# Patient Record
Sex: Female | Born: 1945
Health system: Southern US, Community
[De-identification: ages and names within clinical notes are randomized; demographics above are authoritative.]

## PROBLEM LIST (undated history)

## (undated) DIAGNOSIS — E785 Hyperlipidemia, unspecified: Secondary | ICD-10-CM

## (undated) DIAGNOSIS — E876 Hypokalemia: Secondary | ICD-10-CM

## (undated) DIAGNOSIS — M179 Osteoarthritis of knee, unspecified: Secondary | ICD-10-CM

## (undated) DIAGNOSIS — R011 Cardiac murmur, unspecified: Secondary | ICD-10-CM

## (undated) DIAGNOSIS — Z87442 Personal history of urinary calculi: Secondary | ICD-10-CM

## (undated) DIAGNOSIS — I10 Essential (primary) hypertension: Secondary | ICD-10-CM

## (undated) DIAGNOSIS — IMO0001 Reserved for inherently not codable concepts without codable children: Secondary | ICD-10-CM

## (undated) DIAGNOSIS — Z9889 Other specified postprocedural states: Secondary | ICD-10-CM

## (undated) DIAGNOSIS — M810 Age-related osteoporosis without current pathological fracture: Secondary | ICD-10-CM

## (undated) DIAGNOSIS — E278 Other specified disorders of adrenal gland: Secondary | ICD-10-CM

## (undated) DIAGNOSIS — M1710 Unilateral primary osteoarthritis, unspecified knee: Secondary | ICD-10-CM

## (undated) DIAGNOSIS — Z464 Encounter for fitting and adjustment of orthodontic device: Secondary | ICD-10-CM

## (undated) DIAGNOSIS — M170 Bilateral primary osteoarthritis of knee: Secondary | ICD-10-CM

## (undated) DIAGNOSIS — T7840XA Allergy, unspecified, initial encounter: Secondary | ICD-10-CM

## (undated) DIAGNOSIS — M8580 Other specified disorders of bone density and structure, unspecified site: Secondary | ICD-10-CM

## (undated) DIAGNOSIS — K219 Gastro-esophageal reflux disease without esophagitis: Secondary | ICD-10-CM

## (undated) DIAGNOSIS — M519 Unspecified thoracic, thoracolumbar and lumbosacral intervertebral disc disorder: Secondary | ICD-10-CM

## (undated) DIAGNOSIS — J40 Bronchitis, not specified as acute or chronic: Secondary | ICD-10-CM

## (undated) DIAGNOSIS — K635 Polyp of colon: Secondary | ICD-10-CM

## (undated) DIAGNOSIS — R112 Nausea with vomiting, unspecified: Secondary | ICD-10-CM

## (undated) DIAGNOSIS — H269 Unspecified cataract: Secondary | ICD-10-CM

## (undated) HISTORY — PX: FUNCTIONAL ENDOSCOPIC SINUS SURGERY: SUR616

## (undated) HISTORY — PX: COLONOSCOPY W/ POLYPECTOMY: SHX1380

## (undated) HISTORY — PX: OTHER SURGICAL HISTORY: SHX169

## (undated) HISTORY — DX: Polyp of colon: K63.5

## (undated) HISTORY — PX: COLONOSCOPY: SHX174

## (undated) HISTORY — PX: TUBAL LIGATION: SHX77

## (undated) HISTORY — PX: CARPAL TUNNEL RELEASE: SHX101

## (undated) HISTORY — DX: Unilateral primary osteoarthritis, unspecified knee: M17.10

## (undated) HISTORY — DX: Unspecified cataract: H26.9

## (undated) HISTORY — DX: Bronchitis, not specified as acute or chronic: J40

## (undated) HISTORY — DX: Allergy, unspecified, initial encounter: T78.40XA

## (undated) HISTORY — DX: Cardiac murmur, unspecified: R01.1

## (undated) HISTORY — DX: Essential (primary) hypertension: I10

## (undated) HISTORY — DX: Other specified disorders of bone density and structure, unspecified site: M85.80

## (undated) HISTORY — PX: KNEE ARTHROSCOPY: SUR90

## (undated) HISTORY — DX: Osteoarthritis of knee, unspecified: M17.9

## (undated) HISTORY — DX: Hyperlipidemia, unspecified: E78.5

## (undated) HISTORY — DX: Gastro-esophageal reflux disease without esophagitis: K21.9

## (undated) HISTORY — PX: BUNIONECTOMY: SHX129

---

## 2004-06-02 LAB — HM DIABETES FOOT EXAM

## 2004-06-07 ENCOUNTER — Ambulatory Visit: Payer: Self-pay | Admitting: Gastroenterology

## 2004-06-14 ENCOUNTER — Ambulatory Visit: Payer: Self-pay | Admitting: Gastroenterology

## 2004-07-03 HISTORY — PX: VEIN SURGERY: SHX48

## 2004-09-28 ENCOUNTER — Ambulatory Visit: Payer: Self-pay | Admitting: Unknown Physician Specialty

## 2004-10-26 ENCOUNTER — Ambulatory Visit: Payer: Self-pay

## 2005-10-12 ENCOUNTER — Ambulatory Visit: Payer: Self-pay | Admitting: Unknown Physician Specialty

## 2005-11-30 ENCOUNTER — Encounter: Payer: Self-pay | Admitting: Family Medicine

## 2006-07-06 ENCOUNTER — Encounter: Payer: Self-pay | Admitting: Family Medicine

## 2006-09-20 ENCOUNTER — Ambulatory Visit (HOSPITAL_BASED_OUTPATIENT_CLINIC_OR_DEPARTMENT_OTHER): Admission: RE | Admit: 2006-09-20 | Discharge: 2006-09-20 | Payer: Self-pay | Admitting: Orthopedic Surgery

## 2006-10-15 ENCOUNTER — Ambulatory Visit: Payer: Self-pay | Admitting: Unknown Physician Specialty

## 2006-11-22 ENCOUNTER — Ambulatory Visit (HOSPITAL_BASED_OUTPATIENT_CLINIC_OR_DEPARTMENT_OTHER): Admission: RE | Admit: 2006-11-22 | Discharge: 2006-11-22 | Payer: Self-pay | Admitting: Orthopedic Surgery

## 2006-12-04 ENCOUNTER — Encounter: Payer: Self-pay | Admitting: Family Medicine

## 2007-01-21 ENCOUNTER — Encounter: Payer: Self-pay | Admitting: Family Medicine

## 2007-10-17 ENCOUNTER — Ambulatory Visit: Payer: Self-pay | Admitting: Unknown Physician Specialty

## 2008-01-17 ENCOUNTER — Ambulatory Visit: Payer: Self-pay | Admitting: Vascular Surgery

## 2008-03-24 ENCOUNTER — Ambulatory Visit: Payer: Self-pay | Admitting: Family Medicine

## 2008-03-24 DIAGNOSIS — I10 Essential (primary) hypertension: Secondary | ICD-10-CM

## 2008-03-24 DIAGNOSIS — Z860100 Personal history of colon polyps, unspecified: Secondary | ICD-10-CM | POA: Insufficient documentation

## 2008-03-24 DIAGNOSIS — E785 Hyperlipidemia, unspecified: Secondary | ICD-10-CM | POA: Insufficient documentation

## 2008-04-23 ENCOUNTER — Encounter: Payer: Self-pay | Admitting: Family Medicine

## 2008-06-24 ENCOUNTER — Encounter: Payer: Self-pay | Admitting: Family Medicine

## 2008-07-17 ENCOUNTER — Ambulatory Visit: Payer: Self-pay | Admitting: Vascular Surgery

## 2008-08-31 LAB — CONVERTED CEMR LAB: Pap Smear: NORMAL

## 2008-08-31 LAB — HM MAMMOGRAPHY: HM Mammogram: NORMAL

## 2008-10-20 ENCOUNTER — Ambulatory Visit: Payer: Self-pay | Admitting: Unknown Physician Specialty

## 2008-10-23 ENCOUNTER — Telehealth: Payer: Self-pay | Admitting: Family Medicine

## 2008-10-29 ENCOUNTER — Ambulatory Visit: Payer: Self-pay | Admitting: Family Medicine

## 2009-05-17 ENCOUNTER — Telehealth: Payer: Self-pay | Admitting: Family Medicine

## 2009-05-31 ENCOUNTER — Encounter: Admission: RE | Admit: 2009-05-31 | Discharge: 2009-05-31 | Payer: Self-pay | Admitting: Orthopedic Surgery

## 2009-06-22 ENCOUNTER — Encounter: Payer: Self-pay | Admitting: Family Medicine

## 2009-07-07 ENCOUNTER — Telehealth: Payer: Self-pay | Admitting: Family Medicine

## 2009-07-30 ENCOUNTER — Ambulatory Visit: Payer: Self-pay | Admitting: Vascular Surgery

## 2009-08-13 ENCOUNTER — Ambulatory Visit: Payer: Self-pay | Admitting: Family Medicine

## 2009-08-13 DIAGNOSIS — J019 Acute sinusitis, unspecified: Secondary | ICD-10-CM

## 2009-09-03 ENCOUNTER — Telehealth: Payer: Self-pay | Admitting: Family Medicine

## 2009-09-08 ENCOUNTER — Telehealth: Payer: Self-pay | Admitting: Family Medicine

## 2009-09-27 ENCOUNTER — Telehealth: Payer: Self-pay | Admitting: Family Medicine

## 2009-10-07 ENCOUNTER — Encounter: Payer: Self-pay | Admitting: Family Medicine

## 2009-10-07 ENCOUNTER — Telehealth: Payer: Self-pay | Admitting: Family Medicine

## 2009-10-21 ENCOUNTER — Ambulatory Visit: Payer: Self-pay | Admitting: Unknown Physician Specialty

## 2009-11-11 ENCOUNTER — Encounter (INDEPENDENT_AMBULATORY_CARE_PROVIDER_SITE_OTHER): Payer: Self-pay | Admitting: *Deleted

## 2009-12-30 ENCOUNTER — Encounter: Payer: Self-pay | Admitting: Family Medicine

## 2010-01-04 ENCOUNTER — Telehealth: Payer: Self-pay | Admitting: Family Medicine

## 2010-01-05 ENCOUNTER — Telehealth: Payer: Self-pay | Admitting: Family Medicine

## 2010-06-10 ENCOUNTER — Encounter: Payer: Self-pay | Admitting: Family Medicine

## 2010-06-10 ENCOUNTER — Telehealth: Payer: Self-pay | Admitting: Family Medicine

## 2010-06-24 ENCOUNTER — Telehealth: Payer: Self-pay | Admitting: Family Medicine

## 2010-08-03 NOTE — Progress Notes (Signed)
Summary: prior [name] is needed for diovan  Phone Note From Pharmacy   [facility] Summary of Call: Prior [name] is needed for diovan, form is on your shelf.  She says she has not tried other meds than diovan but she says she has been on it for years. Initial call taken by: Lowella Petties CMA,  September 08, 2009 9:40 AM  Follow-up for Phone Call        I noted this on form they will not likely cover it if denied I will have her check her list for covered med (will find out first) form done and in nurse in box  Follow-up by: Judith Part MD,  September 08, 2009 1:34 PM  Additional Follow-up for Phone Call Additional follow up Details #1::        completed form faxed to [phone] as instructed. Lewanda Rife LPN  September 08, 4780 2:55 PM

## 2010-08-03 NOTE — Progress Notes (Signed)
Summary: Samples  Phone Note Call from Patient Call back at Work Phone [phone]   Caller: Patient Call For: Judith Part MD Summary of Call: pt would like samples of Nexium to last her until her 08/09/2009 appt with Dr. Milinda Antis. I spoke with pt and offered to send in rx to pharmacy and pt states that it's too expensive thru a pharmacy, she uses mail order which is much cheaper.  ok to give samples? Initial call taken by: Mervin Hack CMA (AAMA),  July 07, 2009 11:19 AM  Follow-up for Phone Call        that is fine if we have them Follow-up by: Judith Part MD,  July 07, 2009 11:46 AM  Additional Follow-up for Phone Call Additional follow up Details #1::        Gave 6 sample boxes of #6 each. Additional Follow-up by: Lowella Petties CMA,  July 07, 2009 11:56 AM

## 2010-08-03 NOTE — Progress Notes (Signed)
Summary: Rx for Nexium ([facility])  Phone Note From Pharmacy Call back at [phone]   Caller: [facility] Call For: Dr. Milinda Antis  Summary of Call: Received fax from [facility].  News Rx for Nexium faxed back to [phone] including strength and form.  Form in your IN box.   Initial call taken by: Linde Gillis CMA Duncan Dull),  September 03, 2009 2:25 PM  Follow-up for Phone Call        form done and in nurse in box  Follow-up by: Judith Part MD,  September 03, 2009 5:27 PM  Additional Follow-up for Phone Call Additional follow up Details #1::        completed form faxed to [phone] as instructed.Lewanda Rife LPN  September 04, 8754 5:32 PM

## 2010-08-03 NOTE — Letter (Signed)
Summary: Scissors No Show Letter  [facility]  [address] [place], [place] [address]   Phone: [phone]  Fax: [phone]    11/11/2009 MRN: [id number]  Amy Herman [address] [place], [place]  [address]   Dear [name],   Our records indicate that you missed your scheduled appointment with __lab___________________ on __5.12.11__________.  Please contact this office to reschedule your appointment as soon as possible.  It is important that you keep your scheduled appointments with your physician, so we can provide you the best care possible.  Please be advised that there may be a charge for "no show" appointments.    Sincerely,    at [facility]

## 2010-08-03 NOTE — Progress Notes (Signed)
Summary: nexium and diovan  Phone Note Refill Request Call back at Work Phone [phone] Message from:  Patient on September 27, 2009 12:24 PM  Refills Requested: Medication #1:  NEXIUM 40 MG CPDR one by mouth daily  Medication #2:  DIOVAN 160 MG TABS one by mouth daily W[facility]. Patient says that she has been trying to get this for 3 weeks or more. Patient wants phone call from Dr. Lucretia Roers nurse regarding this.   Initial call taken by: Melody Comas,  September 27, 2009 12:25 PM Caller: Patient Call For: Judith Part MD  Follow-up for Phone Call        Left message for patient to call back. Lewanda Rife LPN  September 27, 2009 4:58 PM   Spoke with pt and she said [facility] said they had not received paperwork on Nexium and Diovan. I explained when both forms were sent and I resent Nexium form and Lowella Bandy is sending Diovan PA again. Pt will ck with [facility] tomorrow to make sure they received the forms. Lewanda Rife LPN  September 28, 2009 2:57 PM

## 2010-08-03 NOTE — Medication Information (Signed)
Summary: Coverage Approval for Diovan  Coverage Approval for Diovan   Imported By: Maryln Gottron 10/11/2009 15:29:21  _____________________________________________________________________  External Attachment:    Type:   Image     Comment:   External Document

## 2010-08-03 NOTE — Progress Notes (Signed)
Summary: Prior Authorization Diovan (3rd request)  Phone Note Outgoing Call Call back at [phone]   Call placed by: Linde Gillis CMA Duncan Dull),  October 07, 2009 9:52 AM Call placed to: [facility] Summary of Call: Spoke with patient, she called to check the status of her PA for Diovan 160mg .  Advised patient that the PA form has been faxed to [facility] twice and we still have not heard a response from them.  Called [facility] and talked to Harris, he advised me that there was no record of a PA ever being sent from our office.  He will fax over another form to be completed by Dr. Milinda Antis.  Patient was advised, there may be a 48-72 hour turn around time before we hear back from [facility] regarding the approval or denial.  Also, it may be denied because she has not tried any other medication first before trying Diovan.  Will [facility] in a 30 day supply [facility] in the mean time while waiting on the [place].  [facility] cell number is [phone].   Linde Gillis CMA (AAMA)  October 07, 2009 10:00 AM   Received PA Approval for Diovan.  Effective date 10/07/2009 Expiration date 11/06/2009.  Letter sent to be scanned into EMR. Initial call taken by: Linde Gillis CMA Duncan Dull),  October 07, 2009 2:39 PM

## 2010-08-03 NOTE — Progress Notes (Signed)
Summary: Diovan 160mg  form from [facility]  Phone Note From Pharmacy Call back at fax [phone]   Caller: [facility] Call For: Dr Roxy Manns  Summary of Call: [facility] faxed refill request for Diovan 160mg  tablet. Form is on your shelf in the in box. Initial call taken by: Lewanda Rife LPN,  January 04, 9810 10:57 AM  Follow-up for Phone Call        form done and in nurse in box   Follow-up by: Judith Part MD,  January 04, 2010 11:41 AM  Additional Follow-up for Phone Call Additional follow up Details #1::        Completed form faxed to [phone] as instructed.Lewanda Rife LPN  January 05, 1307 11:51 AM     Prescriptions: DIOVAN 160 MG TABS (VALSARTAN) one by mouth daily  #90 x 3   Entered and Authorized by:   Judith Part MD   Signed by:   Lewanda Rife LPN on 65/78/4696   Method used:   Historical   RxID:   [id number]

## 2010-08-03 NOTE — Progress Notes (Signed)
Summary: needs new order for lab work  Phone Note Call from Patient Call back at Work Phone [phone]   Caller: Patient Call For: Judith Part MD Summary of Call: Pt misplaced the lab order that she had to take to [facility].  She is asking that a new one be faxed to [facility].  Fax number is [phone] Initial call taken by: Lowella Petties CMA, AAMA,  June 10, 2010 11:09 AM  Follow-up for Phone Call        order done for lipid/ast/alt and in IN box Follow-up by: Judith Part MD,  June 10, 2010 1:20 PM  Additional Follow-up for Phone Call Additional follow up Details #1::        order faxed to [phone] as instructed.Lewanda Rife LPN  June 10, 2010 4:40 PM

## 2010-08-03 NOTE — Progress Notes (Signed)
Summary: pt wants to try welchol  Phone Note Call from Patient Call back at Work Phone [phone]   Caller: Patient Call For: Judith Part MD Summary of Call: Pt states her insurance will cover welchol.  [facility] [address] [address] Initial call taken by: Lowella Petties CMA,  January 05, 2010 4:23 PM  Follow-up for Phone Call        px written on EMR for call in - wellchol continue watching diet  if any side eff let me know  sched fasting labs for lipid 272 in 6 weeks please  Follow-up by: Judith Part MD,  January 05, 2010 5:14 PM  Additional Follow-up for Phone Call Additional follow up Details #1::        Patient notified as instructed by telephone. [facility] as instructed. Pt needs written order for lab mailed to home address. Pt gets blood drawn at [employer] where she works.Lewanda Rife LPN  January 07, 980 8:19 AM  order done and in IN box Additional Follow-up by: Judith Part MD,  January 06, 2010 8:21 AM    Additional Follow-up for Phone Call Additional follow up Details #2::    Lab order mailed to pt's home address as instructed.Lewanda Rife LPN  January 06, 1913 10:03 AM   New/Updated Medications: WELCHOL 625 MG TABS (COLESEVELAM HCL) 3 by mouth two times a day Prescriptions: WELCHOL 625 MG TABS (COLESEVELAM HCL) 3 by mouth two times a day  #1 month x 11   Entered and Authorized by:   Judith Part MD   Signed by:   Lewanda Rife LPN on 78/29/5621   Method used:   Telephoned to ...         RxID:   [id number]

## 2010-08-03 NOTE — Progress Notes (Signed)
Summary: Prior Authorization Diovan(more info being requested)  Phone Note From Pharmacy   Caller: [facility] Call For: Dr. Milinda Antis  Summary of Call: Received faxed form back from [facility] stating that authorization was delayed for Diovan 160mg .  Missing these the information listed at bottom of forms.  Forms in your IN box. Initial call taken by: Linde Gillis CMA Duncan Dull),  September 08, 2009 4:12 PM  Follow-up for Phone Call        form done and in nurse in box  Follow-up by: Judith Part MD,  September 08, 2009 5:22 PM  Additional Follow-up for Phone Call Additional follow up Details #1::        completed form faxed to [phone] as instructed. Original form given to [facility] in case needed later.Lewanda Rife LPN  September 08, 9809 5:40 PM

## 2010-08-03 NOTE — Assessment & Plan Note (Signed)
Summary: CPX/CLE R/S FROM 08/09/09   Vital Signs:  Patient profile:   65 year old female Height:      63 inches Weight:      152 pounds BMI:     27.02 Temp:     98.8 degrees F oral Pulse rate:   88 / minute Pulse rhythm:   regular BP sitting:   120 / 68  (left arm) Cuff size:   regular  Vitals Entered By: Lewanda Rife LPN (August 13, 2009 3:00 PM)  History of Present Illness: here for health mt exam   had to go UC on tuesday -- dx with sinus infection is on augmentin  is getting much better now  exp to lot of sick people   gyn care sched march 2011 also mam   wt is down 6 lb-- proud of that  exercise and healthy eating   bp in good control at 120/68  lipids are due to check - on max statin tolerated is staying away from fatty foods except for rare occasions  overall diet is better  hates statin - it makes her hurt -- her muscle pain is getting severe on that  cannot function with it     osteopenia dexa 1/08-- is due for one to be scheduled  ca and D-- taking caltrate and extra vit D  is also exercising regularly   colon polyps in past and fam hx of colon cancer  had nl pap andmam last spring   Td 08 up to date  zostavax 08 flu shot -- got that   pneumovax -- got that at her last doctor less than 5 y ago       Allergies: 1)  ! Ceftin 2)  ! Biaxin 3)  ! Crestor  Review of Systems General:  Denies fatigue, fever, loss of appetite, and malaise. Eyes:  Denies blurring and eye irritation. ENT:  Complains of postnasal drainage and sinus pressure. CV:  Denies chest pain or discomfort, lightheadness, palpitations, and shortness of breath with exertion. Resp:  Denies cough, shortness of breath, and wheezing. GI:  Denies abdominal pain, bloody stools, change in bowel habits, indigestion, nausea, and vomiting. GU:  Denies abnormal vaginal bleeding, discharge, dysuria, and urinary frequency. MS:  Complains of muscle aches; denies joint pain. Derm:  Denies  itching, lesion(s), poor wound healing, and rash. Neuro:  Denies numbness and tingling. Psych:  Denies anxiety and depression. Endo:  Denies cold intolerance, excessive thirst, excessive urination, and heat intolerance. Heme:  Denies abnormal bruising and bleeding.  Physical Exam  General:  Well-developed,well-nourished,in no acute distress; alert,appropriate and cooperative throughout examination Head:  normocephalic, atraumatic, and no abnormalities observed.  no sinus tenderness Eyes:  vision grossly intact, pupils equal, pupils round, pupils reactive to light, and no injection.   Ears:  R ear normal and L ear normal.   Nose:  nares are boggy and mildly congested  Mouth:  pharynx pink and moist, no erythema, and no exudates.   Neck:  supple with full rom and no masses or thyromegally, no JVD or carotid bruit  Chest Wall:  No deformities, masses, or tenderness noted. Lungs:  Normal respiratory effort, chest expands symmetrically. Lungs are clear to auscultation, no crackles or wheezes. Heart:  Normal rate and regular rhythm. S1 and S2 normal without gallop, murmur, click, rub or other extra sounds. Abdomen:  Bowel sounds positive,abdomen soft and non-tender without masses, organomegaly or hernias noted. no renal bruits  Msk:  No deformity or scoliosis  noted of thoracic or lumbar spine.  no acute joint changes Pulses:  R and L carotid,radial,femoral,dorsalis pedis and posterior tibial pulses are full and equal bilaterally Extremities:  No clubbing, cyanosis, edema, or deformity noted with normal full range of motion of all joints.   Neurologic:  sensation intact to light touch, gait normal, and DTRs symmetrical and normal.   Skin:  Intact without suspicious lesions or rashes Cervical Nodes:  No lymphadenopathy noted Inguinal Nodes:  No significant adenopathy Psych:  normal affect, talkative and pleasant    Impression & Recommendations:  Problem # 1:  HEALTH MAINTENANCE EXAM  (ICD-V70.0) Assessment Comment Only reviewed health habits including diet, exercise and skin cancer prevention reviewed health maintenance list and family history great habits   Problem # 2:  HYPERLIPIDEMIA (ICD-272.4) pt will stop crestor due to severe muscle pain does not want statin  chol in good control now - rev her labs  re check 3 mo  consider zetia or welchol if affordible The following medications were removed from the medication list:    Crestor 5 Mg Tabs (Rosuvastatin calcium) .Marland Kitchen... 1/2 by mouth three times per week  Problem # 3:  ESSENTIAL HYPERTENSION, BENIGN (ICD-401.1) Assessment: Unchanged  bp in good control  rev labs  Her updated medication list for this problem includes:    Diovan 160 Mg Tabs (Valsartan) ..... One by mouth daily    Triamterene-hctz 37.5-25 Mg Tabs (Triamterene-hctz) ..... One by mouth daily  BP today: 120/68 Prior BP: 130/80 (10/29/2008)  Problem # 4:  SINUSITIS - ACUTE-NOS (ICD-461.9) Assessment: New likely why her wbc is elevated will re check in 3 mo  is imp on abx Her updated medication list for this problem includes:    Amoxicillin-pot Clavulanate 875-125 Mg Tabs (Amoxicillin-pot clavulanate) .Marland Kitchen... Take one twice a day until finished  Complete Medication List: 1)  Nexium 40 Mg Cpdr (Esomeprazole magnesium) .... One by mouth daily 2)  Diovan 160 Mg Tabs (Valsartan) .... One by mouth daily 3)  Zyrtec Allergy 10 Mg Tabs (Cetirizine hcl) .... One by mouth daily 4)  Adult Aspirin Ec Low Strength 81 Mg Tbec (Aspirin) .... One by mouth daily 5)  Triamterene-hctz 37.5-25 Mg Tabs (Triamterene-hctz) .... One by mouth daily 6)  Vitamin D 400 Unit Caps (Cholecalciferol) .... One by mouth two times a day 7)  Xyletan  8)  Niacin Over The Counter  .Marland Kitchen.. 1 pill daily 9)  Calcium Citrate-vitamin D 315-200 Mg-unit Tabs (Calcium citrate-vitamin d) .... Take one daily 10)  Meloxicam 15 Mg Tabs (Meloxicam) .... Take one daily with food as needed 11)   Amoxicillin-pot Clavulanate 875-125 Mg Tabs (Amoxicillin-pot clavulanate) .... Take one twice a day until finished 12)  Allergy Injection  .... Takes one allergy injection once a wk at [facility] in [place]  Patient Instructions: 1)  the current recommendation for calcium intake is 1200-1500 mg daily with -1000 IU of vitamin D  2)  call some pharmacies and check on the prices of zetia and welchol  3)  let me know if you want to try either of these  4)  I will want to re check cholesterol off medicine in 3 months along with cbc to make sure white blood cell count comes down  5)  keep up the good work with diet and exercise  Prescriptions: TRIAMTERENE-HCTZ 37.5-25 MG TABS (TRIAMTERENE-HCTZ) one by mouth daily  #90 x 3   Entered and Authorized by:   Judith Part MD   Signed by:  Judith Part MD on 08/13/2009   Method used:   Print then Give to Patient   RxID:   [id number] DIOVAN 160 MG TABS (VALSARTAN) one by mouth daily  #90 x 3   Entered and Authorized by:   Judith Part MD   Signed by:   Judith Part MD on 08/13/2009   Method used:   Print then Give to Patient   RxID:   [id number] NEXIUM 40 MG CPDR (ESOMEPRAZOLE MAGNESIUM) one by mouth daily  #90 x 3   Entered and Authorized by:   Judith Part MD   Signed by:   Judith Part MD on 08/13/2009   Method used:   Print then Give to Patient   RxID:   [id number]   Current Allergies (reviewed today): ! CEFTIN ! BIAXIN ! CRESTOR   Preventive Care Screening  Mammogram:    Date:  08/31/2008    Results:  normal   Pap Smear:    Date:  08/31/2008    Results:  normal     Influenza Immunization History:    Influenza # 1:  Fluvax 3+ (05/03/2009)  Pneumovax Immunization History:    Pneumovax # 1:  Pneumovax (07/03/2004)

## 2010-08-04 NOTE — Progress Notes (Signed)
Summary: didnt take welchol  Phone Note Call from Patient Call back at Work Phone [phone]   Caller: Patient Summary of Call: Advised pt of lab results.  She said she didnt get welchol because it is so expensive.  She is asking if there is anything else that she can try, cant tolerate statins.  [facility] [address]   Lowella Petties CMA, AAMA  June 24, 2010 9:23 AM   Follow-up for Phone Call        zetia would be the other option -- she can check on price of that and let me know  I'm glad it improved some anyway continue low sat fat diet  Follow-up by: Judith Part MD,  June 24, 2010 12:41 PM  Additional Follow-up for Phone Call Additional follow up Details #1::        Patient notified as instructed by telephone. Pt will ck on price of Zetia and will callback with info. Not sure when she will call back.Lewanda Rife LPN  June 24, 2010 3:28 PM

## 2010-09-01 LAB — HM MAMMOGRAPHY: HM Mammogram: NORMAL

## 2010-09-07 ENCOUNTER — Encounter: Payer: Self-pay | Admitting: Family Medicine

## 2010-10-28 ENCOUNTER — Ambulatory Visit: Payer: Self-pay | Admitting: Unknown Physician Specialty

## 2010-10-31 ENCOUNTER — Encounter: Payer: Self-pay | Admitting: Family Medicine

## 2010-11-04 ENCOUNTER — Telehealth: Payer: Self-pay | Admitting: *Deleted

## 2010-11-04 NOTE — Telephone Encounter (Signed)
Patient is asking if she could get an order to take to [facility] for a CMET, because her Dr. At [facility] suggested that she have this done since she has been on the meloxicam for a while. Also she is asking if she can get a separate order for her cpx labs. Her cpx is in June, but she would like to go ahead and have it on file at [facility]. She is asking that these rx be mailed to her home.

## 2010-11-07 NOTE — Telephone Encounter (Signed)
Patient notified as instructed by telephone. Lab orders mailed to pt's home address.

## 2010-11-07 NOTE — Telephone Encounter (Signed)
I did orders on px pad cmet now for 995.2 Then before June PE cbc and tsh and lipids for 995.2 and also 272  In IN box

## 2010-11-15 NOTE — Op Note (Signed)
NAME:  Amy Herman, Amy Herman                    ACCOUNT NO.:  [id number]   MEDICAL RECORD NO.:  [id number]           PATIENT TYPE:   LOCATION:                                 FACILITY:   PHYSICIAN:  Deidre Ala, M.D.    DATE OF BIRTH:  04-Jul-1945   DATE OF PROCEDURE:  11/22/2006  DATE OF DISCHARGE:                               OPERATIVE REPORT   PREOPERATIVE DIAGNOSIS:  Left carpal tunnel syndrome.   POSTOPERATIVE DIAGNOSIS:  Left carpal tunnel syndrome.   PROCEDURE:  Left carpal tunnel release.   ASSISTANT:  Phineas Semen, PA-C.   ANESTHESIA:  General with LMA.   CULTURES:  None.   DRAINS:  None.   ESTIMATED BLOOD LOSS:  Minimal.   TOURNIQUET TIME:  20 minutes.   </PATHOLOGIC FINDINGS/HISTORY>  The patient has had bilateral carpal tunnel syndrome with positive nerve  conduction studies showing moderate to severe bilateral median  mononeuropathy.  The patient underwent a successful right carpal tunnel  release approximately 1-1/2 months ago, and she has done well.  The left  hand was still symptomatic, so surgery was scheduled.  At surgery,  classic findings were noted with a tight transverse carpal ligament that  was well released up to the distal forearm.   PROCEDURE:  With adequate anesthesia obtained using LMA technique, 1 gm  vancomycin was given IV prophylaxis due to a Ceftin allergy.  The  patient was placed in the supine position.  The left upper extremity was  prepped from the fingertips to the upper forearm in the standard  fashion.  After standard prepping and draping, Esmarch exsanguination  was used.  The tourniquet was let up to 250 mmHg.  Longitudinal skin  incision was then made at the base of the palm in the thumb flexion  crease to the distal palmar wrist flexion crease.  The incision was  deepened sharply with adequate hemostasis obtained using the Bovie  electrocoagulated.  Under loupe magnification, dissection was carried  down to the palmar fascia.  I then  placed a Therapist, nutritional over the  nerve to protect it and then cut down upon it with a 64 Beaver blade.  Careful neurolysis was then carried out with scissors, removing some of  the radial transverse carpal ligament.  I then released the distal wrist  retinaculum on the ulnar side of the nerve well up into the forearm.  I  did a volar aponeurotomy and traced all branches distally, including the  motor branch.  Irrigation was carried out.  The wound was then closed  with interrupted and running 4-0 nylon.  A bulky sterile  compressive dressing was applied with volar plaster splint in slight  cock-up.  The patient having the procedure well was awakened and taken  to the recovery room in satisfactory condition, to be discharged per  outpatient routine, given Vicodin for pain, and told to call the office  for recheck on Wednesday.           ______________________________  V. Charlesetta Shanks, M.D.     VEP/MEDQ  D:  11/22/2006  T:  11/22/2006  Job:  [id number]   cc:   Deidre Ala, M.D.  Fax: [phone]

## 2010-11-15 NOTE — Assessment & Plan Note (Signed)
OFFICE VISIT   Amy Herman, Amy Herman  DOB:  1946-05-27                                       07/30/2009  ZOXWR#:[id number]   The patient presents today for continued followup of the node in her  left groin.  This was an incidental finding on a venous study 18 months  ago.  We had repeated this at 94-month interval, which at that time was  no change, and is seen today for a final followup 1 year out.  She has  no symptoms of DVT.  She has no lymphedema and no increased swelling in  her left leg versus her right.  She has no pain associated with this.   MEDICAL HISTORY:  Unchanged with hypertension and elevated cholesterol.  She continues to be a nonsmoker and nondrinker.   REVIEW OF SYSTEMS:  Positive for a heart murmur, gastroesophageal  reflux, arthritis, otherwise negative.  No weight loss or weight gain.   PHYSICAL EXAMINATION:  Blood pressure is 149/95, heart rate 83,  respirations 18, temperature 98.4.  She is well-developed, in no acute  distress.  HEENT is normal.  Abdomen is soft, nontender, no masses.  Musculoskeletal:  No major deformities or cyanosis.  Neurologic:  No  focal weakness or paresthesias.  Skin:  No ulcers, rashes or lymphedema.  She does have no palpable masses in either groin.  She has 2+ femoral  pulses.   She underwent repeat duplex in our office and this reveals what appears  to be a slightly enlarged lymph node, maximal diameter of 1.35 cm, which  is down from this last study 1 year ago.  I counseled that there is no  concern regarding this area and have reassured the patient.  I would not  recommend further followup since we have followed this with no change  for 18 months.     Larina Earthly, M.D.  Electronically Signed   TFE/MEDQ  D:  07/30/2009  T:  08/02/2009  Job:  [id number]   cc:   Lunette Stands, M.D.

## 2010-11-15 NOTE — Procedures (Signed)
VASCULAR LAB EXAM   INDICATION:  Palpable left groin mass.   HISTORY:  Diabetes:  No.  Cardiac:  No.  Hypertension:  Yes.   EXAM:  Left groin duplex.   IMPRESSION:  1. Limited study.  2. No evidence of deep venous thrombosis noted in the left common      femoral vein.  3. Two cystic structures that appear to be enlarging lymph node noted      in the left groin region.   ___________________________________________  Larina Earthly, M.D.   MG/MEDQ  D:  01/17/2008  T:  01/17/2008  Job:  [id number]

## 2010-11-15 NOTE — Procedures (Signed)
VASCULAR LAB EXAM   INDICATION:  Follow up palpable left groin mass.   HISTORY:  Diabetes:  No.  Cardiac:  No.  Hypertension:  Yes.   EXAM:  Left groin duplex.   IMPRESSION:  1. No evidence of deep venous thrombosis noted in left common femoral      vein.  2. A cystic structure that appears to be an enlarged lymph node that      measured 0.57 X 1.35 noted in the groin area.   ___________________________________________  Larina Earthly, M.D.   MG/MEDQ  D:  07/30/2009  T:  07/30/2009  Job:  [id number]

## 2010-11-15 NOTE — Op Note (Signed)
NAME:  Amy Herman, Amy Herman NO.:  [id number]   MEDICAL RECORD NO.:  [id number]          PATIENT TYPE:  AMB   LOCATION:  [facility]                         FACILITY:  [facility]   PHYSICIAN:  Deidre Ala, M.D.    DATE OF BIRTH:  Feb 20, 1946   DATE OF PROCEDURE:  11/22/2006  DATE OF DISCHARGE:                               OPERATIVE REPORT   ADDENDUM   Please note that it should be noted the patient works for [employer] with  repetitive motion.  This is a worker's comp.  She had bilateral carpal  tunnel diagnosed.  The nerve conduction and EMG showed moderate right,  mild left.  We did do the right carpal tunnel release approximately 2  1/2 months ago and she has done well and we proceeded on the left.           ______________________________  V. Charlesetta Shanks, M.D.     VEP/MEDQ  D:  11/22/2006  T:  11/22/2006  Job:  [id number]

## 2010-11-15 NOTE — Assessment & Plan Note (Signed)
OFFICE VISIT   Amy Herman, Amy Herman  DOB:  [birth year]                                       07/17/2008  ZOXWR#:[id number]   The patient presents today for continued evaluation of a probable lymph  node compressing her left common femoral vein.  I had seen her initially  regarding this in July 2009.  I did not feel that this was any specific  pathologic condition and recommended that we see her again in 6 months  to repeat the duplex.  She is seen today in the office.  She reports  that she remains quite physically active with no new problems,  specifically denies any swelling or pain in her left groin.  She  underwent a duplex today and she continues to have an area of  approximately 1 cm mass which appears to be a lymph node behind, deep to  her common femoral vein.  This is not causing any venous compression.  I  again discussed this at length with the patient.  I do not feel that  this is any concern from a pathologic entity, especially since there has  been no change in 6 months.  I recommend that we see her again for one  final time in 1 year from now to rule out any change and at that point  would discontinue follow-up, assuming that this has not changed.  She  will see [place] in 1 year and notify us should she develop any new  difficulties.   Larina Earthly, M.D.  Electronically Signed   TFE/MEDQ  D:  07/17/2008  T:  07/20/2008  Job:  [id number]   cc:   Lunette Stands, M.D.

## 2010-11-15 NOTE — Consult Note (Signed)
NEW PATIENT CONSULTATION   Amy Herman, Amy Herman  DOB:  29-Dec-1945                                       01/17/2008  ZDGLO#:[id number]   The patient presents today for evaluation of left leg venous duplex  finding.  She is an active healthy 65 year old black female who  underwent what sounds like sclerotherapy at an outlying vein center.  She has had yearly repeat venous duplexes and on her most recent duplex  was told that she had a mass near her left common femoral vein causing  compression.  She had follow-up with Dr. Charlett Blake and discussed this with  her and we are seeing her for further discussion of this.  She underwent  pelvic GYN evaluation and according to the patient this was normal.  Her  health is otherwise noted for elevated cholesterol and elevated blood  pressure, she is married, she works as a Designer, industrial/product, she does not smoke or  drink alcohol.  She is very active, works with a Armed forces training and education officer  several times per week.   PHYSICAL EXAM:  A well-developed, well-nourished black female appearing  stated age 3.  Blood pressure is 156/96, pulse 87, respirations 18.  On  physical exam, she does not have any swelling bilaterally, she does have  normal dorsalis pedis pulses bilaterally.  I do not feel any masses in  her left groin.  We re-imaged her left groin and she does have several  slightly prominent lymph nodes in her left groin but no other masses  noted.  I reassured the patient with this.  She is having no symptoms  related to this and therefore I would not recommend any further  evaluation.  We will see her again in 6 months with repeat ultrasound to  confirm that there is no change, and assuming this is negative we will  see her then on an as-needed basis following this.   Larina Earthly, M.D.  Electronically Signed   TFE/MEDQ  D:  01/17/2008  T:  01/20/2008  Job:  [id number]   cc:   Lunette Stands, M.D.

## 2010-11-15 NOTE — Procedures (Signed)
VASCULAR LAB EXAM   INDICATION:  Follow-up evaluation of left groin cystic structures.  Rule  out compression of left common femoral vein.   HISTORY:  Diabetes:  No  Cardiac:  No  Hypertension:  Yes   EXAM:  Two cystic structures were seen in the left inguinal area.  There  is a superficial structure measuring 1.2 cm x 0.5 cm consistent with a  lymph node.  The second structure measuring 0.9 cm x 1.5 cm is adjacent  to and deeper than the common femoral vein at the saphenofemoral  junction.  The second structure does not appear to compromise flow in  the common femoral vein.   IMPRESSION:  No evidence of left common femoral vein deep vein thrombus  or incompetence.   Previously-documented structures identified and do not appear to  compress the common femoral vein.   ___________________________________________  Larina Earthly, M.D.   MC/MEDQ  D:  07/17/2008  T:  07/17/2008  Job:  [id number]

## 2010-11-18 NOTE — Op Note (Signed)
NAME:  Amy Herman, LECCESE                ACCOUNT NO.:  [id number]   MEDICAL RECORD NO.:  [id number]          PATIENT TYPE:  AMB   LOCATION:  [facility]                         FACILITY:  [facility]   PHYSICIAN:  Deidre Ala, M.D.    DATE OF BIRTH:  22-Oct-1945   DATE OF PROCEDURE:  09/20/2006  DATE OF DISCHARGE:                               OPERATIVE REPORT   PREOPERATIVE DIAGNOSIS:  Right carpal tunnel syndrome.   POSTOPERATIVE DIAGNOSIS:  Right carpal tunnel syndrome.   OPERATION/PROCEDURE:  Right carpal tunnel release.   SURGEON:  1. Charlesetta Shanks, M.D.   ASSISTANT:  Phineas Semen, P.A.-C.   ANESTHESIA:  General with LMA.   SPECIMENS:  None.   DRAINS:  None.   ESTIMATED BLOOD LOSS:  Minimal.   TOURNIQUET TIME:  20 minutes.   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  Symptom started May 02, 2006.  She was brought  by rehab nurse, Sandi Mariscal.  She had positive nerve conduction  studies.  She had been wearing splints and had been unresponsive to  conservative management.  It was elected to proceed with right carpal  tunnel release.  At surgery, classic findings were noted with an hour  glass nerve and reddening.  All branches were traced distally including  the motor branch and we did also encounter the palmar cutaneous branch  was which was protected and preserved as we made our incision slightly  more radial and Langer's skin lines.   PROCEDURE:  With adequate anesthesia obtained using LMA technique, 1  gram Ancef given IV prophylaxis, the patient was placed in the supine  position.  The right upper extremity was prepped from fingertips to the  upper forearm in standard fashion.  After standard prepping and draping,  Esmarch examination was used.  The tourniquet was let out up to 250  mmHg.  A longitudinal skin incision was then made at the base of the  palm in the thumb flexion crease, a bit radial to usual, but in order to  be in the Langer's skin lines.  Incision was deepened sharply with a  knife.  Hemostasis obtained using the Bovie electrocoagulator.  Dissection was carried down to the palmar fascia which was incised  longitudinally with the protection of a Freer elevator deep.  This  exposed the nerve.  Careful dissection was then carried out with  neurolysis of the nerve distally and proximally on the ulnar side of the  nerve and the distal wrist flexor retinaculum.  Aponeurectomy was  carried out of the volar part of the nerve and all branches were traced  distally including the motor branch.  Irrigation was carried out and the  wound was closed with running and interrupted 4-0 nylon.  A bulky  sterile compressive dressing was applied with volar plaster splint and  slight cock-up.  The patient then having procedure well was awakened,  taken to recovery room in satisfactory condition to be discharged per  outpatient routine.   Vicodin for pain and told to call the office for recheck on  Monday.  Elevation and wound cleanliness.           ______________________________  V. Charlesetta Shanks, M.D.     VEP/MEDQ  D:  09/20/2006  T:  09/20/2006  Job:  [id number]

## 2010-11-25 ENCOUNTER — Other Ambulatory Visit: Payer: Self-pay | Admitting: *Deleted

## 2010-11-25 MED ORDER — TRIAMTERENE-HCTZ 37.5-25 MG PO TABS: 1.0000 | ORAL_TABLET | Freq: Every day | ORAL | Status: DC

## 2010-11-25 MED ORDER — ESOMEPRAZOLE MAGNESIUM 40 MG PO CPDR: 40.0000 mg | DELAYED_RELEASE_CAPSULE | Freq: Every day | ORAL | Status: DC

## 2011-01-23 ENCOUNTER — Telehealth: Payer: Self-pay | Admitting: *Deleted

## 2011-01-23 NOTE — Telephone Encounter (Signed)
Amy Herman said pt did not want call back just mail lab order.Lab order mailed to patient as instructed.

## 2011-01-23 NOTE — Telephone Encounter (Signed)
Labs are in IN box--thanks

## 2011-01-23 NOTE — Telephone Encounter (Signed)
Patient has a cpx with you next Tuesday. She is asking if she can have an order for labs mailed to her home. She works at [employer].

## 2011-01-27 ENCOUNTER — Telehealth: Payer: Self-pay | Admitting: *Deleted

## 2011-01-27 NOTE — Telephone Encounter (Signed)
Patient says that she still hasn't received her order for labs and she was hoping to have her labs drawn today because her cpx is on the 31st. She is asking if she can get another order written and come in and pick it up today.

## 2011-01-27 NOTE — Telephone Encounter (Signed)
I wrote another order According to the chart - one was already mailed to her ? What happened

## 2011-01-27 NOTE — Telephone Encounter (Signed)
Patient notified as instructed by telephone.Lab order left at front desk. The order was mailed 01/23/11 but has to go to mail room at another location. Pt should received in mail today or tomorrow.But pt wants labs drawn today so she will pick up lab order.

## 2011-01-30 ENCOUNTER — Encounter: Payer: Self-pay | Admitting: Family Medicine

## 2011-01-31 ENCOUNTER — Encounter: Payer: Self-pay | Admitting: Family Medicine

## 2011-01-31 ENCOUNTER — Ambulatory Visit (INDEPENDENT_AMBULATORY_CARE_PROVIDER_SITE_OTHER): Payer: 59 | Admitting: Family Medicine

## 2011-01-31 DIAGNOSIS — E876 Hypokalemia: Secondary | ICD-10-CM

## 2011-01-31 DIAGNOSIS — E785 Hyperlipidemia, unspecified: Secondary | ICD-10-CM

## 2011-01-31 DIAGNOSIS — I10 Essential (primary) hypertension: Secondary | ICD-10-CM

## 2011-01-31 DIAGNOSIS — M949 Disorder of cartilage, unspecified: Secondary | ICD-10-CM

## 2011-01-31 DIAGNOSIS — Z23 Encounter for immunization: Secondary | ICD-10-CM

## 2011-01-31 DIAGNOSIS — M810 Age-related osteoporosis without current pathological fracture: Secondary | ICD-10-CM | POA: Insufficient documentation

## 2011-01-31 DIAGNOSIS — Z860100 Personal history of colon polyps, unspecified: Secondary | ICD-10-CM

## 2011-01-31 DIAGNOSIS — Z780 Asymptomatic menopausal state: Secondary | ICD-10-CM | POA: Insufficient documentation

## 2011-01-31 DIAGNOSIS — M8580 Other specified disorders of bone density and structure, unspecified site: Secondary | ICD-10-CM

## 2011-01-31 MED ORDER — POTASSIUM CHLORIDE ER 10 MEQ PO TBCR: 10.0000 meq | EXTENDED_RELEASE_TABLET | Freq: Every day | ORAL | Status: DC

## 2011-01-31 MED ORDER — TRIAMTERENE-HCTZ 37.5-25 MG PO TABS: 1.0000 | ORAL_TABLET | Freq: Every day | ORAL | Status: DC

## 2011-01-31 NOTE — Progress Notes (Signed)
Subjective:    Patient ID: Amy Herman, female    DOB: 1945-08-10, 65 y.o.   MRN: [id number]  HPI Pt is here for f/u of chronic medical problems and to rev health mt list Is doing great - is working towards retirement  Is excited  May go back to work in  3 years for reduced schedule - depending on what she wants to do   Is very busy - working outdoors  Is working out very regularly  Has good attitude about getting older  She cares for husband with DM who is 15  Wt is stable - she does well with that   Gyn visit-- went in march 2012 , also had her pap  Mam-normal march 2012 Also had her breast exam   Has a tender place under L breast -thinks from her work out bra Was also using a post hole digger - muscles in back are sore    ptx 06   Td 08  zostavax was 08  Osteopenia  Last dexa- was at [facility] - 2009 or 2008  Ca and vit D - is good about that   Labs - recent at [facility]  K is low at 3.2   Has high chol  Could not afford welchol  Would take one 5 mg crestor once per week -- makes her hurt  Eats well HDL is 61 Trig good  Eats well   Colonoscopy -- mother had colon can , she had polyps in past  Is due in dec 2013 at [facility] - is her 8 year follow up   Patient Active Problem List  Diagnoses  . HYPERLIPIDEMIA  . ESSENTIAL HYPERTENSION, BENIGN  . SINUSITIS - ACUTE-NOS  . COLONIC POLYPS, HX OF  . Osteopenia  . Post-menopausal  . Hypokalemia   Past Medical History  Diagnosis Date  . Allergic rhinitis   . Hypertension   . GERD (gastroesophageal reflux disease)   . Osteoarthritis of knee   . Hyperlipidemia   . Bronchitis     more frequent in the past  . Osteopenia   . Colon polyps     history of  . Glaucoma     suspected  . Heart murmur    Past Surgical History  Procedure Date  . Carpal tunnel release   . Bunionectomy     bilateral  . Knee arthroscopy     right  . Tubal ligation     bilateral  . Vein surgery 2006    legs     History  Substance Use Topics  . Smoking status: Never Smoker   . Smokeless tobacco: Not on file   Comment: used to have secondary smoke exposure from husband  . Alcohol Use: No   Family History  Problem Relation Age of Onset  . Cancer Mother     colon, lungs, liver  . Cancer Father     prostate  . Heart disease Father     A-fib  . Stroke Father     TIA's  . Alcohol abuse Brother   . Cancer Brother     pancreatic, smoker  . Diabetes Brother   . Diabetes Brother    Allergies  Allergen Reactions  . Biaxin Other (See Comments)    Burning sensation to stomach  . Cefuroxime Axetil     REACTION: bumps on tounge  . Clarithromycin     REACTION: GI  . Rosuvastatin     REACTION: severe muscle pain   Current  Outpatient Prescriptions on File Prior to Visit  Medication Sig Dispense Refill  . aspirin 81 MG EC tablet Take 81 mg by mouth daily.        . calcium citrate-vitamin D (CITRACAL+D) 315-200 MG-UNIT per tablet Take 1 tablet by mouth daily.        . cetirizine (ZYRTEC) 10 MG tablet Take 10 mg by mouth daily.        Marland Kitchen esomeprazole (NEXIUM) 40 MG capsule Take 1 capsule (40 mg total) by mouth daily before breakfast.  90 capsule  0  . NIACIN CR PO Take by mouth. Take one by mouth daily       . valsartan (DIOVAN) 160 MG tablet Take 160 mg by mouth daily.        . vitamin D, CHOLECALCIFEROL, 400 UNITS tablet Take 400 Units by mouth 2 (two) times daily.        Marland Kitchen amoxicillin-clavulanate (AUGMENTIN) 875-125 MG per tablet Take 1 tablet by mouth 2 (two) times daily.        . meloxicam (MOBIC) 15 MG tablet Take 15 mg by mouth daily as needed.             Review of Systems Review of Systems  Constitutional: Negative for fever, appetite change, fatigue and unexpected weight change.  Eyes: Negative for pain and visual disturbance.  Respiratory: Negative for cough and shortness of breath.   Cardiovascular: Negative.  For cp or sob  Gastrointestinal: Negative for nausea, diarrhea  and constipation.  Genitourinary: Negative for urgency and frequency.  Skin: Negative for pallor. or rash  Neurological: Negative for weakness, light-headedness, numbness and headaches.  Hematological: Negative for adenopathy. Does not bruise/bleed easily.  Psychiatric/Behavioral: Negative for dysphoric mood. The patient is not nervous/anxious.          Objective:   Physical Exam  Constitutional: She appears well-developed and well-nourished. No distress.  HENT:  Head: Normocephalic and atraumatic.  Right Ear: External ear normal.  Left Ear: External ear normal.  Nose: Nose normal.  Mouth/Throat: Oropharynx is clear and moist.  Eyes: Conjunctivae and EOM are normal. Pupils are equal, round, and reactive to light.  Neck: Normal range of motion. Neck supple. No JVD present. Carotid bruit is not present. No thyromegaly present.  Cardiovascular: Normal rate, regular rhythm, normal heart sounds and intact distal pulses.   Pulmonary/Chest: Effort normal and breath sounds normal. No respiratory distress. She has no wheezes.  Abdominal: Soft. Bowel sounds are normal. She exhibits no distension and no mass. There is no tenderness.  Musculoskeletal: Normal range of motion. She exhibits no edema and no tenderness.  Lymphadenopathy:    She has no cervical adenopathy.  Neurological: She is alert. She has normal reflexes. No cranial nerve deficit. Coordination normal.  Skin: Skin is warm and dry. No rash noted. No erythema. No pallor.  Psychiatric: She has a normal mood and affect.          Assessment & Plan:   No problem-specific assessment & plan notes found for this encounter.

## 2011-01-31 NOTE — Assessment & Plan Note (Signed)
Due for dexa Rev ca and D Is exercising No fractures

## 2011-01-31 NOTE — Assessment & Plan Note (Addendum)
Per pt due for 8 year colonosc in dec 2013 (she called)  No bowel changes Also has fam hx

## 2011-01-31 NOTE — Assessment & Plan Note (Signed)
This is well controlled with current meds and healthy lifestyle Does need extra K and that was px Rev labs with pt

## 2011-01-31 NOTE — Assessment & Plan Note (Signed)
Start K dur 10 meq daily Re check 2 wk From diuretic

## 2011-01-31 NOTE — Patient Instructions (Signed)
Pneumonia vaccine today  We will refer you for dexa at check out  Keep taking calcium and vitamin  Start the potassium one pill daily  Schedule labs in 2 weeks for potassium  Avoid red meat/ fried foods/ egg yolks/ fatty breakfast meats/ butter, cheese and high fat dairy/ and shellfish  (cholesterol is higher)

## 2011-01-31 NOTE — Assessment & Plan Note (Signed)
Lipids are high  Unfortunately pt cannot afford welchol and cannot tolerate more statin Rev low sat fat diet  Hope for cost of med to go down

## 2011-02-17 ENCOUNTER — Other Ambulatory Visit: Payer: Self-pay

## 2011-02-17 MED ORDER — ESOMEPRAZOLE MAGNESIUM 40 MG PO CPDR: 40.0000 mg | DELAYED_RELEASE_CAPSULE | Freq: Every day | ORAL | Status: DC

## 2011-02-17 MED ORDER — TRIAMTERENE-HCTZ 37.5-25 MG PO TABS: 1.0000 | ORAL_TABLET | Freq: Every day | ORAL | Status: DC

## 2011-02-17 NOTE — Telephone Encounter (Signed)
[facility] faxed refill request for Nexium 40mg  #90 x1 and Triamterene HCTZ 37.5-25 #90 x 3.(was filled recently at local pharmacy).

## 2011-02-20 ENCOUNTER — Encounter: Payer: Self-pay | Admitting: Family Medicine

## 2011-02-20 ENCOUNTER — Telehealth: Payer: Self-pay | Admitting: Family Medicine

## 2011-02-20 MED ORDER — VALSARTAN 160 MG PO TABS: 160.0000 mg | ORAL_TABLET | Freq: Every day | ORAL | Status: DC

## 2011-02-20 NOTE — Telephone Encounter (Signed)
[facility] refil  Form in IN box

## 2011-02-20 NOTE — Telephone Encounter (Signed)
Completed form faxed to [phone].

## 2011-02-24 ENCOUNTER — Telehealth: Payer: Self-pay | Admitting: *Deleted

## 2011-02-24 ENCOUNTER — Encounter: Payer: Self-pay | Admitting: Family Medicine

## 2011-02-24 NOTE — Telephone Encounter (Signed)
Let pt know that her insurance will probably deny the diovan- I filled out form Ask if she is paying 100% out of pocket and mark that box on the form  Ask if she has failed any other HTN meds - we may have to change it Thanks  Is in IN box

## 2011-02-24 NOTE — Telephone Encounter (Signed)
Patient notified as instructed by telephone. Pt said she has not been paying 100% out of pocket to her knowledge for the Diovan and pt has never taken any other med for BP. Noted on form. Completed form faxed to [phone] as instructed. Form given to [place].

## 2011-02-24 NOTE — Telephone Encounter (Signed)
Patient notified as instructed by telephone that bone density done on 02/10/11 was normal. Results sent to be scanned.

## 2011-02-24 NOTE — Telephone Encounter (Signed)
Prior [name] is needed for diovan.  Form is on your shelf, there is also a form if you want to change to something else instead.

## 2011-02-27 ENCOUNTER — Other Ambulatory Visit: Payer: Self-pay | Admitting: *Deleted

## 2011-02-27 MED ORDER — VALSARTAN 160 MG PO TABS: 160.0000 mg | ORAL_TABLET | Freq: Every day | ORAL | Status: DC

## 2011-02-27 NOTE — Telephone Encounter (Signed)
Pt is out of medicine, waiting for decision on prior auth request.

## 2011-03-02 ENCOUNTER — Telehealth: Payer: Self-pay | Admitting: *Deleted

## 2011-03-02 NOTE — Telephone Encounter (Signed)
Pt is asking for samples of nexium, number 2 boxes of 5 each given.  Lot number [id number], exp 10/2013. Also, prior auth given for nexium, approval letter placed on your shelf for signature and scanning.

## 2011-03-03 NOTE — Telephone Encounter (Signed)
The samples are fine I cannot find the prior auth unless I already did it

## 2011-03-03 NOTE — Telephone Encounter (Signed)
Amy Cones do know where prior [name] is?

## 2011-03-14 ENCOUNTER — Encounter: Payer: Self-pay | Admitting: *Deleted

## 2011-03-22 NOTE — Telephone Encounter (Signed)
Prior auth had been completed and approved.

## 2011-03-23 ENCOUNTER — Telehealth: Payer: Self-pay | Admitting: *Deleted

## 2011-03-23 ENCOUNTER — Other Ambulatory Visit: Payer: Self-pay | Admitting: *Deleted

## 2011-03-23 MED ORDER — TRIAMTERENE-HCTZ 37.5-25 MG PO TABS: 1.0000 | ORAL_TABLET | Freq: Every day | ORAL | Status: DC

## 2011-03-23 NOTE — Telephone Encounter (Signed)
That is fine, thanks 

## 2011-03-23 NOTE — Telephone Encounter (Signed)
Waiting for mail order to arrive

## 2011-03-23 NOTE — Telephone Encounter (Signed)
Pt is waiting for nexium to arrive from mail order pharmacy and asks for a few samples to get her through until they do.  2 sample boxes of #5 each given.  Lot number [id number], exp 12/2013.

## 2011-03-29 ENCOUNTER — Other Ambulatory Visit: Payer: Self-pay | Admitting: *Deleted

## 2011-03-29 MED ORDER — MELOXICAM 15 MG PO TABS: 15.0000 mg | ORAL_TABLET | Freq: Every day | ORAL | Status: DC | PRN

## 2011-03-29 NOTE — Telephone Encounter (Signed)
Pt is asking for refill on meloxicam for the arthritis in her knees.  She hasnt had this in a long time, previously prescribed by Dr. Charlett Blake.  She doesn't remember what strength she was taking, but what we have in her chart is 15 mg's.  She needs a new script sent to [facility] [address]

## 2011-03-29 NOTE — Telephone Encounter (Signed)
Let her know we need to be careful with this drug as it can cause GI bleeding and more importantly- increase bp  Take it only when absolutely needed Will refill electronically

## 2011-03-29 NOTE — Telephone Encounter (Signed)
Patient notified as instructed by telephone. 

## 2011-04-07 ENCOUNTER — Telehealth: Payer: Self-pay | Admitting: *Deleted

## 2011-04-07 NOTE — Telephone Encounter (Signed)
Pt is asking about how much potassium is ok for her.  Her last level was 3.5 so she is concerned about it now going to high.  She drinks one 8 oz cup of prune juice every morning, but doesn't think she should take her klor con everyday, since the prune juice is full of K+. Please advise on what you think.

## 2011-04-09 NOTE — Telephone Encounter (Signed)
Since 3.5 is at the low end of the normal range- she can to ahead and take the K med daily

## 2011-04-10 NOTE — Telephone Encounter (Signed)
Patient notified as instructed by telephone. 

## 2011-05-04 HISTORY — PX: FUNCTIONAL ENDOSCOPIC SINUS SURGERY: SUR616

## 2011-06-08 ENCOUNTER — Ambulatory Visit (INDEPENDENT_AMBULATORY_CARE_PROVIDER_SITE_OTHER): Payer: 59

## 2011-06-08 DIAGNOSIS — Z23 Encounter for immunization: Secondary | ICD-10-CM

## 2011-07-21 ENCOUNTER — Other Ambulatory Visit: Payer: Self-pay | Admitting: *Deleted

## 2011-07-21 NOTE — Telephone Encounter (Signed)
Patient called requesting that prescription be sent to her new mail order pharmacy. Left message for patient to call back to confirm correct mail order pharmacy.

## 2011-07-21 NOTE — Telephone Encounter (Signed)
Pt requested refill and left no detail.  Attempt made to call patient for further information and left voice mail asking patient to call us back.

## 2011-07-25 NOTE — Telephone Encounter (Signed)
Spoke with pt and she said she had spoken with pharmacy again and was told they already have refills for K. Pt said she appreciated the call back.

## 2011-07-25 NOTE — Telephone Encounter (Signed)
Left vm for pt to callback 

## 2011-08-15 ENCOUNTER — Ambulatory Visit (INDEPENDENT_AMBULATORY_CARE_PROVIDER_SITE_OTHER): Payer: 59 | Admitting: Family Medicine

## 2011-08-15 ENCOUNTER — Encounter: Payer: Self-pay | Admitting: Family Medicine

## 2011-08-15 VITALS — BP 108/66 | HR 84 | Temp 98.0°F | Ht 60.75 in | Wt 158.2 lb

## 2011-08-15 DIAGNOSIS — M519 Unspecified thoracic, thoracolumbar and lumbosacral intervertebral disc disorder: Secondary | ICD-10-CM | POA: Insufficient documentation

## 2011-08-15 DIAGNOSIS — M2550 Pain in unspecified joint: Secondary | ICD-10-CM

## 2011-08-15 DIAGNOSIS — H6980 Other specified disorders of Eustachian tube, unspecified ear: Secondary | ICD-10-CM

## 2011-08-15 MED ORDER — FLUTICASONE PROPIONATE 50 MCG/ACT NA SUSP: 2.0000 | Freq: Every day | NASAL | Status: DC

## 2011-08-15 NOTE — Assessment & Plan Note (Signed)
After a cold- with post nasal drip- in pt with sinus surg last year Will tx with flonase for 2 wk Update if not starting to improve in a week or if worsening

## 2011-08-15 NOTE — Assessment & Plan Note (Signed)
Updated per pt This may add to some leg pain  Under care of Dr Charlett Blake and Franky Macho

## 2011-08-15 NOTE — Patient Instructions (Addendum)
Use flonase nasal spray daily for 2 weeks to help open up the ear Update if not starting to improve in a week or if worsening   Drink lots of fluids Lab today for joint pain (to investigate possible auto immune arthritis) Will update you with results

## 2011-08-15 NOTE — Progress Notes (Signed)
Subjective:    Patient ID: Amy Herman, female    DOB: 1945/09/18, 66 y.o.   MRN: [id number]  HPI Here for ear pain /throat pain on L side  Has had symptoms since christmas -- went to walk in clinic , dx with mild pneumonia - and tx with levaquin, did not tolerate-- augmentin was next She got better   Feb first- developed throat pain/ ears itch and cough - and mucous in throat  Went to UC also - dx with OM both ears and "throat infx" -- and tx augmentin  Got better and now worse again Last night - on L side - burns/ tickles on L side of throat - causing cough No fever now (initially low grade) All clear d/c No sinus pain  Felt fine   Had sinus surgery in [place] last year  Nasal polyps     Also joint pain Started about 3 weeks ago (with snowstorm), wore boots to work L leg swelled - knee/ ankle and leg  Foot got sore No redness or heat  Has varicose vein  No injury  Is improved now - but joints do burn and ache to some degree No autoimmune arthritis right now  She is worried about this and wants lab work for that      Also deg disc dz - has a bulging disc - in lumbar area  L4-L5 -- is getting by with it  Sees Dr Charlett Blake and Dr Franky Macho (when she had a bad flare up)  Pain occ goes into legs  Overall that is fairly controlled   Patient Active Problem List  Diagnoses  . HYPERLIPIDEMIA  . ESSENTIAL HYPERTENSION, BENIGN  . COLONIC POLYPS, HX OF  . Osteopenia  . Post-menopausal  . Hypokalemia  . Eustachian tube dysfunction  . Lumbar disc disease  . Joint pain   Past Medical History  Diagnosis Date  . Allergic rhinitis   . Hypertension   . GERD (gastroesophageal reflux disease)   . Osteoarthritis of knee   . Hyperlipidemia   . Bronchitis     more frequent in the past  . Osteopenia   . Colon polyps     history of  . Glaucoma     suspected  . Heart murmur    Past Surgical History  Procedure Date  . Carpal tunnel release   . Bunionectomy    bilateral  . Knee arthroscopy     right  . Tubal ligation     bilateral  . Vein surgery 2006    legs   History  Substance Use Topics  . Smoking status: Never Smoker   . Smokeless tobacco: Not on file   Comment: used to have secondary smoke exposure from husband  . Alcohol Use: No   Family History  Problem Relation Age of Onset  . Cancer Mother     colon, lungs, liver  . Cancer Father     prostate  . Heart disease Father     A-fib  . Stroke Father     TIA's  . Alcohol abuse Brother   . Cancer Brother     pancreatic, smoker  . Diabetes Brother   . Diabetes Brother    Allergies  Allergen Reactions  . Biaxin Other (See Comments)    Burning sensation to stomach  . Cefuroxime Axetil     REACTION: bumps on tounge  . Clarithromycin     REACTION: GI  . Levaquin   . Rosuvastatin  REACTION: severe muscle pain   Current Outpatient Prescriptions on File Prior to Visit  Medication Sig Dispense Refill  . aspirin 81 MG EC tablet Take 81 mg by mouth daily.        . calcium citrate-vitamin D (CITRACAL+D) 315-200 MG-UNIT per tablet Take 1 tablet by mouth daily.        Marland Kitchen esomeprazole (NEXIUM) 40 MG capsule Take 1 capsule (40 mg total) by mouth daily before breakfast.  90 capsule  1  . Garlic (ODOR FREE GARLIC) 100 MG TABS Take 2 tablets by mouth 2 (two) times daily.       Marland Kitchen latanoprost (XALATAN) 0.005 % ophthalmic solution Place 1 drop into both eyes at bedtime.       . meloxicam (MOBIC) 15 MG tablet Take 1 tablet (15 mg total) by mouth daily as needed for pain. With food  30 tablet  2  . NIACIN CR PO Take by mouth. Take one by mouth daily       . rosuvastatin (CRESTOR) 5 MG tablet Take 5 mg by mouth once a week.        . triamterene-hydrochlorothiazide (MAXZIDE-25) 37.5-25 MG per tablet Take 1 each (1 tablet total) by mouth daily.  15 tablet  0  . valsartan (DIOVAN) 160 MG tablet Take 1 tablet (160 mg total) by mouth daily.  10 tablet  0  . vitamin D, CHOLECALCIFEROL, 400 UNITS  tablet Take 400 Units by mouth 2 (two) times daily.        Marland Kitchen amoxicillin-clavulanate (AUGMENTIN) 875-125 MG per tablet Take 1 tablet by mouth 2 (two) times daily.        . cetirizine (ZYRTEC) 10 MG tablet Take 10 mg by mouth daily.        . potassium chloride (K-DUR) 10 MEQ tablet Take 1 tablet (10 mEq total) by mouth daily.  30 tablet  11  . SINGULAIR 10 MG tablet Take 10 mg by mouth at bedtime as needed.             Review of Systems Review of Systems  Constitutional: Negative for fever, appetite change, fatigue and unexpected weight change.  Eyes: Negative for pain and visual disturbance.  ENt pos for ear discomfort/ muffled hearing and throat soreness , neg for sinus pain  Respiratory: Negative sob or wheeze   Cardiovascular: Negative for cp or palpitations    Gastrointestinal: Negative for nausea, diarrhea and constipation.  Genitourinary: Negative for urgency and frequency.  Skin: Negative for pallor or rash   MSK pos for generalized joint pain without swelling or redness/ pos for back pain that radiates to legs  Neurological: Negative for weakness, light-headedness, numbness and headaches.  Hematological: Negative for adenopathy. Does not bruise/bleed easily.  Psychiatric/Behavioral: Negative for dysphoric mood. The patient is not nervous/anxious.          Objective:   Physical Exam  Constitutional: She appears well-developed and well-nourished.  HENT:  Head: Normocephalic and atraumatic.  Right Ear: External ear normal.  Mouth/Throat: Oropharynx is clear and moist.       Nares boggy with clear rhinorrhea L TM is retracted and dull  No sinus tenderness Post nasal drip noted  Throat -clear without erythema   Eyes: Conjunctivae and EOM are normal. Pupils are equal, round, and reactive to light. Right eye exhibits no discharge. Left eye exhibits no discharge. No scleral icterus.  Neck: Normal range of motion. Neck supple. No JVD present. Carotid bruit is not present. No  thyromegaly present.  Cardiovascular: Normal rate, regular rhythm, normal heart sounds and intact distal pulses.   Pulmonary/Chest: Effort normal and breath sounds normal. No respiratory distress. She has no wheezes. She exhibits no tenderness.  Abdominal: Soft. Bowel sounds are normal. She exhibits no distension and no mass. There is no tenderness.  Musculoskeletal: Normal range of motion. She exhibits no edema and no tenderness.       No acute joint changes or tenderness today  Pt thinks her knees look swollen- I do not notice this  No crepitus of any joint  Nl rom   No LS tenderness today  Lymphadenopathy:    She has no cervical adenopathy.  Neurological: She is alert. She has normal reflexes. No cranial nerve deficit. She exhibits normal muscle tone. Coordination normal.  Skin: Skin is warm and dry. No rash noted. No erythema. No pallor.  Psychiatric: She has a normal mood and affect.          Assessment & Plan:

## 2011-08-15 NOTE — Assessment & Plan Note (Signed)
Pt worried about auto immune dz Re assuring exam  Per request check RF and ANA and SR  Suspect more likely osteoarthritis Recommend wt loss/ low impact exercise

## 2011-08-16 LAB — ANA: Anti Nuclear Antibody(ANA): NEGATIVE

## 2011-09-11 ENCOUNTER — Other Ambulatory Visit: Payer: Self-pay | Admitting: *Deleted

## 2011-09-11 MED ORDER — MELOXICAM 15 MG PO TABS: 15.0000 mg | ORAL_TABLET | Freq: Every day | ORAL | Status: DC | PRN

## 2011-09-11 NOTE — Telephone Encounter (Signed)
Will refill electronically  

## 2011-09-11 NOTE — Telephone Encounter (Signed)
Received faxed refill request from pharmacy for Meloxicam. Is it okay to refill medication?

## 2011-09-12 ENCOUNTER — Other Ambulatory Visit: Payer: Self-pay

## 2011-09-12 MED ORDER — MELOXICAM 15 MG PO TABS: 15.0000 mg | ORAL_TABLET | Freq: Every day | ORAL | Status: DC | PRN

## 2011-09-12 NOTE — Telephone Encounter (Signed)
[facility] faxed refill Meloxicam 15 mg #90 x 1. Completed form faxed to [phone] and sent for scanning.

## 2011-12-08 ENCOUNTER — Other Ambulatory Visit: Payer: Self-pay | Admitting: *Deleted

## 2011-12-08 MED ORDER — TRIAMTERENE-HCTZ 37.5-25 MG PO TABS: 1.0000 | ORAL_TABLET | Freq: Every day | ORAL | Status: DC

## 2011-12-27 ENCOUNTER — Encounter: Payer: Self-pay | Admitting: Family Medicine

## 2011-12-27 ENCOUNTER — Ambulatory Visit (INDEPENDENT_AMBULATORY_CARE_PROVIDER_SITE_OTHER): Payer: 59 | Admitting: Family Medicine

## 2011-12-27 VITALS — BP 122/82 | HR 88 | Temp 97.9°F | Wt 156.0 lb

## 2011-12-27 DIAGNOSIS — J069 Acute upper respiratory infection, unspecified: Secondary | ICD-10-CM

## 2011-12-27 NOTE — Patient Instructions (Addendum)
Good to see you. I think this likely a virus. Keep doing what you're doing! Call us if symptoms do not improve over next 5-7 days.

## 2011-12-27 NOTE — Progress Notes (Signed)
SUBJECTIVE:  Amy Herman is a 66 y.o. female who complains of coryza, congestion, sneezing, sore throat and dry cough for 4 days. She denies a history of anorexia, chest pain, chills, dizziness and fatigue and denies a history of asthma. Patient denies smoke cigarettes.   Patient Active Problem List  Diagnosis  . HYPERLIPIDEMIA  . ESSENTIAL HYPERTENSION, BENIGN  . COLONIC POLYPS, HX OF  . Osteopenia  . Post-menopausal  . Hypokalemia  . Eustachian tube dysfunction  . Lumbar disc disease  . Joint pain   Past Medical History  Diagnosis Date  . Allergic rhinitis   . Hypertension   . GERD (gastroesophageal reflux disease)   . Osteoarthritis of knee   . Hyperlipidemia   . Bronchitis     more frequent in the past  . Osteopenia   . Colon polyps     history of  . Glaucoma     suspected  . Heart murmur    Past Surgical History  Procedure Date  . Carpal tunnel release   . Bunionectomy     bilateral  . Knee arthroscopy     right  . Tubal ligation     bilateral  . Vein surgery 2006    legs   History  Substance Use Topics  . Smoking status: Never Smoker   . Smokeless tobacco: Not on file   Comment: used to have secondary smoke exposure from husband  . Alcohol Use: No   Family History  Problem Relation Age of Onset  . Cancer Mother     colon, lungs, liver  . Cancer Father     prostate  . Heart disease Father     A-fib  . Stroke Father     TIA's  . Alcohol abuse Brother   . Cancer Brother     pancreatic, smoker  . Diabetes Brother   . Diabetes Brother    Allergies  Allergen Reactions  . Cefuroxime Axetil     REACTION: bumps on tounge  . Clarithromycin     REACTION: GI  . Clarithromycin Other (See Comments)    Burning sensation to stomach  . Levofloxacin   . Rosuvastatin     REACTION: severe muscle pain   Current Outpatient Prescriptions on File Prior to Visit  Medication Sig Dispense Refill  . amoxicillin-clavulanate (AUGMENTIN) 875-125 MG per  tablet Take 1 tablet by mouth 2 (two) times daily.        Marland Kitchen aspirin 81 MG EC tablet Take 81 mg by mouth daily.        . calcium citrate-vitamin D (CITRACAL+D) 315-200 MG-UNIT per tablet Take 1 tablet by mouth daily.        . cetirizine (ZYRTEC) 10 MG tablet Take 10 mg by mouth daily.        Marland Kitchen esomeprazole (NEXIUM) 40 MG capsule Take 1 capsule (40 mg total) by mouth daily before breakfast.  90 capsule  1  . fexofenadine (ALLEGRA) 60 MG tablet Take 60 mg by mouth daily.      . fluticasone (FLONASE) 50 MCG/ACT nasal spray Place 2 sprays into the nose daily. 2 sprays in each nostril daily  16 g  1  . Garlic (ODOR FREE GARLIC) 100 MG TABS Take 2 tablets by mouth 2 (two) times daily.       Marland Kitchen latanoprost (XALATAN) 0.005 % ophthalmic solution Place 1 drop into both eyes at bedtime.       . meloxicam (MOBIC) 15 MG tablet Take 1 tablet (15  mg total) by mouth daily as needed for pain. With food  90 tablet  1  . NIACIN CR PO Take by mouth. Take one by mouth daily       . potassium chloride (K-DUR) 10 MEQ tablet Take 1 tablet (10 mEq total) by mouth daily.  30 tablet  11  . rosuvastatin (CRESTOR) 5 MG tablet Take 5 mg by mouth once a week.        Marland Kitchen SINGULAIR 10 MG tablet Take 10 mg by mouth at bedtime as needed.       . triamterene-hydrochlorothiazide (MAXZIDE-25) 37.5-25 MG per tablet Take 1 each (1 tablet total) by mouth daily.  15 tablet  0  . valsartan (DIOVAN) 160 MG tablet Take 1 tablet (160 mg total) by mouth daily.  10 tablet  0  . vitamin D, CHOLECALCIFEROL, 400 UNITS tablet Take 400 Units by mouth 2 (two) times daily.         The PMH, PSH, Social History, Family History, Medications, and allergies have been reviewed in [facility], and have been updated if relevant.  OBJECTIVE: BP 122/82  Pulse 88  Temp 97.9 F (36.6 C)  Wt 156 lb (70.761 kg)  She appears well, vital signs are as noted. Ears normal.  Throat and pharynx normal.  Neck supple. No adenopathy in the neck. Nose is congested. Sinuses non  tender. The chest is clear, without wheezes or rales.  ASSESSMENT:  viral upper respiratory illness  PLAN: Symptomatic therapy suggested: push fluids, rest and return office visit prn if symptoms persist or worsen. Lack of antibiotic effectiveness discussed with her. Call or return to clinic prn if these symptoms worsen or fail to improve as anticipated.

## 2011-12-28 ENCOUNTER — Other Ambulatory Visit: Payer: Self-pay

## 2011-12-28 MED ORDER — TRIAMTERENE-HCTZ 37.5-25 MG PO TABS: 1.0000 | ORAL_TABLET | Freq: Every day | ORAL | Status: DC

## 2011-12-28 MED ORDER — VALSARTAN 160 MG PO TABS: 160.0000 mg | ORAL_TABLET | Freq: Every day | ORAL | Status: DC

## 2011-12-28 NOTE — Telephone Encounter (Signed)
Pt owes balance at [facility] and cannot get med until paid next week. Request called to [facility].pt notified by phone.

## 2012-01-05 ENCOUNTER — Other Ambulatory Visit: Payer: Self-pay

## 2012-01-19 ENCOUNTER — Other Ambulatory Visit: Payer: Self-pay

## 2012-01-19 MED ORDER — VALSARTAN 160 MG PO TABS: 160.0000 mg | ORAL_TABLET | Freq: Every day | ORAL | Status: DC

## 2012-01-19 NOTE — Telephone Encounter (Signed)
Pt request valsartan # 30 x 0 to [facility] [address]. Pt notified done while on phone.

## 2012-01-20 ENCOUNTER — Other Ambulatory Visit: Payer: Self-pay | Admitting: Family Medicine

## 2012-01-22 ENCOUNTER — Other Ambulatory Visit: Payer: Self-pay

## 2012-01-22 MED ORDER — TRIAMTERENE-HCTZ 37.5-25 MG PO TABS: 1.0000 | ORAL_TABLET | Freq: Every day | ORAL | Status: DC

## 2012-01-22 NOTE — Telephone Encounter (Signed)
Will refill electronically  

## 2012-01-22 NOTE — Telephone Encounter (Signed)
Ok to refill? Last OV was 12/21/11 

## 2012-02-22 ENCOUNTER — Telehealth: Payer: Self-pay

## 2012-02-22 NOTE — Telephone Encounter (Signed)
[facility] is requesting a new Rx request for Nexium Cap. Form is in your basket

## 2012-02-23 MED ORDER — ESOMEPRAZOLE MAGNESIUM 40 MG PO CPDR: 40.0000 mg | DELAYED_RELEASE_CAPSULE | Freq: Every day | ORAL | Status: DC

## 2012-02-23 NOTE — Telephone Encounter (Signed)
I will send px electronically instead

## 2012-03-01 ENCOUNTER — Other Ambulatory Visit: Payer: Self-pay | Admitting: Family Medicine

## 2012-03-01 NOTE — Telephone Encounter (Signed)
Pt called requesting refill of DIOVAN.

## 2012-03-01 NOTE — Telephone Encounter (Signed)
I think she is due for an annual exam Please schedule that and refil divoan until that time- thanks

## 2012-03-02 ENCOUNTER — Other Ambulatory Visit: Payer: Self-pay | Admitting: Family Medicine

## 2012-03-05 ENCOUNTER — Other Ambulatory Visit: Payer: Self-pay | Admitting: *Deleted

## 2012-03-05 NOTE — Telephone Encounter (Signed)
Opened in error

## 2012-03-05 NOTE — Telephone Encounter (Signed)
Left detailed message notifying patient to call the office back and schedule her annual exam.  Rx sent in for Diovan.

## 2012-03-08 ENCOUNTER — Telehealth: Payer: Self-pay

## 2012-03-08 MED ORDER — VALSARTAN 160 MG PO TABS: 160.0000 mg | ORAL_TABLET | Freq: Every day | ORAL | Status: DC

## 2012-03-08 MED ORDER — POTASSIUM CHLORIDE ER 10 MEQ PO TBCR: 10.0000 meq | EXTENDED_RELEASE_TABLET | Freq: Every day | ORAL | Status: DC

## 2012-03-08 NOTE — Telephone Encounter (Signed)
Rx for 30 day supply  Diovan 160 mg #30 0R  [facility] [phone] Patient is aware.

## 2012-03-08 NOTE — Telephone Encounter (Signed)
Pt waiting on rx from [facility] mail order pharmacy. Pt request samples for Diovan 160 mg and Nexium 40 mg. Pt would like to pick up today. Pt also request diovan and Klor con sent to [facility] instead of [facility]. Pt said would call back for CPX. Diovan and Klor con refills at [facility] cancelled; spoke with [employer]

## 2012-03-08 NOTE — Telephone Encounter (Signed)
Will refill electronically to [facility] Can have whatever samples we have  If we don't have what she needs - can call in short supply to local pharmacy

## 2012-03-14 ENCOUNTER — Telehealth: Payer: Self-pay

## 2012-03-14 NOTE — Telephone Encounter (Signed)
Pt request BP and lipid and creatinine readings from 01/2011 cpx. Pt given info and pt will call back to schedule CPX.

## 2012-03-19 ENCOUNTER — Other Ambulatory Visit: Payer: Self-pay | Admitting: *Deleted

## 2012-03-20 ENCOUNTER — Telehealth: Payer: Self-pay | Admitting: *Deleted

## 2012-03-20 NOTE — Telephone Encounter (Signed)
We received a Rx request for [facility]zide [facility] but med was just filled in July with 5 refills but was sent to local pharm. Trying to see where she needs meds to go, called pt but no answer left voicemail, will try to call back later

## 2012-03-22 NOTE — Telephone Encounter (Signed)
No answer when tried to call pt again but did fax over note to [facility] letting them know this Rx was sent to local pharm. On 01/22/12 with 5 refills

## 2012-04-08 ENCOUNTER — Telehealth: Payer: Self-pay

## 2012-04-08 ENCOUNTER — Other Ambulatory Visit: Payer: Self-pay | Admitting: Family Medicine

## 2012-04-08 NOTE — Telephone Encounter (Signed)
Here are orders to fax- please make sure the put results in EPIC!-- I put epic acct number on the px to fax Thanks  Please make sure this is what they want I added a D level

## 2012-04-08 NOTE — Telephone Encounter (Signed)
Pt has wellness screening form from pts's employer [employer]. Pt request written order faxed to [employer] [phone] for LP+creatinine+HbgA1C test # [id number] and glucose serum test # [id number]. Pt will bring wellness form to Dr Royden Purl office for completion at later date;pt has CPX scheduled 06/28/12.** Pt is at [employer] drawing station now requesting order faxed ASAP since pt has fasted. Please advise.

## 2012-04-09 LAB — COMPREHENSIVE METABOLIC PANEL
ALT: 15 IU/L (ref 0–32)
Albumin/Globulin Ratio: 1.7 (ref 1.1–2.5)
Alkaline Phosphatase: 105 IU/L (ref 47–112)
BUN/Creatinine Ratio: 19 (ref 11–26)
Chloride: 99 mmol/L (ref 97–108)
GFR calc Af Amer: 71 mL/min/{1.73_m2} (ref >59)
GFR calc non Af Amer: 61 mL/min/{1.73_m2} (ref >59)
Potassium: 3.8 mmol/L (ref 3.5–5.2)
Total Bilirubin: 0.4 mg/dL (ref 0.0–1.2)

## 2012-04-09 LAB — LIPID PANEL W/O CHOL/HDL RATIO
Cholesterol, Total: 265 mg/dL — ABNORMAL HIGH (ref 100–199)
LDL Calculated: 185 mg/dL — ABNORMAL HIGH (ref 0–99)
VLDL Cholesterol Cal: 17 mg/dL (ref 5–40)

## 2012-04-09 LAB — CBC WITH DIFFERENTIAL
Basophils Absolute: 0.1 10*3/uL (ref 0.0–0.2)
Basos: 2 % (ref 0–3)
Eosinophils Absolute: 0.2 10*3/uL (ref 0.0–0.4)
HCT: 42.2 % (ref 34.0–46.6)
Lymphocytes Absolute: 2 10*3/uL (ref 0.7–3.1)
Lymphs: 34 % (ref 14–46)
MCH: 29.8 pg (ref 26.6–33.0)
MCHC: 33.6 g/dL (ref 31.5–35.7)
MCV: 89 fL (ref 79–97)
Monocytes Absolute: 0.5 10*3/uL (ref 0.1–0.9)
Neutrophils Absolute: 3 10*3/uL (ref 1.4–7.0)
RDW: 12.6 % (ref 12.3–15.4)

## 2012-04-09 LAB — VITAMIN D 25 HYDROXY (VIT D DEFICIENCY, FRACTURES): Vit D, 25-Hydroxy: 25.7 ng/mL — ABNORMAL LOW (ref 30.0–100.0)

## 2012-04-09 LAB — TSH: TSH: 1.19 u[IU]/mL (ref 0.450–4.500)

## 2012-04-12 ENCOUNTER — Encounter: Payer: Self-pay | Admitting: *Deleted

## 2012-04-19 DIAGNOSIS — Z0279 Encounter for issue of other medical certificate: Secondary | ICD-10-CM

## 2012-04-22 ENCOUNTER — Telehealth: Payer: Self-pay | Admitting: *Deleted

## 2012-04-22 MED ORDER — EZETIMIBE 10 MG PO TABS: 10.0000 mg | ORAL_TABLET | Freq: Every day | ORAL | Status: DC

## 2012-04-22 NOTE — Telephone Encounter (Signed)
I sent px  Schedule fasting lab for 1 mo for lipid/ast/alt for hyperlipidemia please  We will see if it helps

## 2012-04-22 NOTE — Telephone Encounter (Signed)
That is fine 

## 2012-04-22 NOTE — Telephone Encounter (Signed)
Pt came in office and left a triage/walk in note requesting to try fenofibrate, you advise that fenofibrate is for high triglycerides and pt doesn't have that. Advise pt of your recommendation and pt wanted me to ask you is there anything else she can try for her cholesterol because crestor's side effects are to hard on her, she was trying to take it at least once a week but the side effects were so bad she had to stop, pt said that welchol is to expensive and is there anything else she can try, please advise

## 2012-04-22 NOTE — Telephone Encounter (Signed)
There is a med called zetia - sometimes it works and sometimes it doesn't - have her check insurance and see if affordable and it may be worth a try As always, keep watching diet Avoid red meat/ fried foods/ egg yolks/ fatty breakfast meats/ butter, cheese and high fat dairy/ and shellfish

## 2012-04-22 NOTE — Telephone Encounter (Signed)
Pt would like to try the zetia but wants a 30 day supply sent to pharm. ([facility] [address] before she gets it sent to her mail order pharmacy to see if it works and she doesn't have any side effects

## 2012-04-22 NOTE — Telephone Encounter (Signed)
Notified pt Rx was sent in to pharm. Pt is a [employer] employee so she said she may have you fax over an order to [employer] but may have labs done here, pt will look at her schedule and call us back to let us know what to do

## 2012-05-13 NOTE — Telephone Encounter (Signed)
Can this encounter be closed?

## 2012-05-14 ENCOUNTER — Telehealth: Payer: Self-pay | Admitting: Family Medicine

## 2012-05-14 NOTE — Telephone Encounter (Signed)
If symptoms worsen tonight - all I can recommend is trip to urgent care or ER - otherwise follow up tomorrow as planned

## 2012-05-14 NOTE — Telephone Encounter (Signed)
Pt advise to go to ER or UC if sxs worsen if not f/u tomorrow as planned

## 2012-05-14 NOTE — Telephone Encounter (Signed)
Caller: Janice/Patient; Phone: [phone]; Reason for Call: Returning call from [place].  Please call back.

## 2012-05-14 NOTE — Telephone Encounter (Signed)
Spoke to pt and scheduled appt for tomorrow, pt said for the last few days she has been getting this jittery feeling that will make her heart rate go up and give her a headache, it only last for a few min. Then it will go away but the jittery feeling will stay. Pt also has been having thick clear nasal discharge and her throat has been achy, pt has been using the nasal saline or Flonase and they are not helping, pt has appt for tomorrow but wanted me to let you know and see if you recommend anything until she is seen tomorrow, please advise

## 2012-05-15 ENCOUNTER — Encounter: Payer: Self-pay | Admitting: Family Medicine

## 2012-05-15 ENCOUNTER — Ambulatory Visit (INDEPENDENT_AMBULATORY_CARE_PROVIDER_SITE_OTHER): Payer: 59 | Admitting: Family Medicine

## 2012-05-15 VITALS — BP 114/74 | HR 75 | Temp 98.3°F | Ht 60.75 in | Wt 157.2 lb

## 2012-05-15 DIAGNOSIS — R002 Palpitations: Secondary | ICD-10-CM

## 2012-05-15 DIAGNOSIS — J3489 Other specified disorders of nose and nasal sinuses: Secondary | ICD-10-CM

## 2012-05-15 DIAGNOSIS — R0981 Nasal congestion: Secondary | ICD-10-CM

## 2012-05-15 NOTE — Patient Instructions (Signed)
Stop caffeine entirely  If palpitations continue or jitteriness , let me know  Try mucinex for thick mucous congestion and drink lots of water  Update if no improvement or if you get sinus pain

## 2012-05-15 NOTE — Assessment & Plan Note (Signed)
From allergies  her dizziness is improved  Adv to try mucinex Does see ENT

## 2012-05-15 NOTE — Progress Notes (Signed)
Subjective:    Patient ID: Amy Herman, female    DOB: [birth year], 66 y.o.   MRN: [id number]  HPI Here for sinus issues and jittery feeling   Lightheaded  Thick mucous drainage from head - clear for the most part - about 3 weeks ago (used flonase and nasal saline) Episodes of nervousness and heart beating hard and a little headache (this would spike her bp and go back down) - 5 minutes  Wondered if it was a panic attack  Not exertional  No sweating or nausea  Now less light headed  Is drinking some caffeine- one cup per day - more than usual A little post nasal drip at night - with cough   Somewhat more stressed  Lost her brother in law - that was hard on her and the family   Feels fine today - drank a coke instead of coffee - reved her heart up a bit she noticed   Takes allegra- not zyrtec  No decongestants  Saw ENT 6 weeks ago - for dizziness - told her she did not have a sinus infection  Some of her polyps were coming back  10 d of prednisone  Had sinus surg last oct  No sinus pain at all  No fever or purulent drainage  Patient Active Problem List  Diagnosis  . HYPERLIPIDEMIA  . ESSENTIAL HYPERTENSION, BENIGN  . COLONIC POLYPS, HX OF  . Osteopenia  . Post-menopausal  . Hypokalemia  . Eustachian tube dysfunction  . Lumbar disc disease  . Joint pain  . Palpitations  . Nasal congestion   Past Medical History  Diagnosis Date  . Allergic rhinitis   . Hypertension   . GERD (gastroesophageal reflux disease)   . Osteoarthritis of knee   . Hyperlipidemia   . Bronchitis     more frequent in the past  . Osteopenia   . Colon polyps     history of  . Glaucoma(365)     suspected  . Heart murmur    Past Surgical History  Procedure Date  . Carpal tunnel release   . Bunionectomy     bilateral  . Knee arthroscopy     right  . Tubal ligation     bilateral  . Vein surgery 2006    legs   History  Substance Use Topics  . Smoking status: Never Smoker     . Smokeless tobacco: Not on file     Comment: used to have secondary smoke exposure from husband  . Alcohol Use: No   Family History  Problem Relation Age of Onset  . Cancer Mother     colon, lungs, liver  . Cancer Father     prostate  . Heart disease Father     A-fib  . Stroke Father     TIA's  . Alcohol abuse Brother   . Cancer Brother     pancreatic, smoker  . Diabetes Brother   . Diabetes Brother    Allergies  Allergen Reactions  . Cefuroxime Axetil     REACTION: bumps on tounge  . Clarithromycin     REACTION: GI  . Clarithromycin Other (See Comments)    Burning sensation to stomach  . Levofloxacin   . Rosuvastatin     REACTION: severe muscle pain   Current Outpatient Prescriptions on File Prior to Visit  Medication Sig Dispense Refill  . aspirin 81 MG EC tablet Take 81 mg by mouth daily.        Marland Kitchen  calcium citrate-vitamin D (CITRACAL+D) 315-200 MG-UNIT per tablet Take 1 tablet by mouth daily.        Marland Kitchen esomeprazole (NEXIUM) 40 MG capsule Take 1 capsule (40 mg total) by mouth daily before breakfast.  90 capsule  3  . fexofenadine (ALLEGRA) 60 MG tablet Take 60 mg by mouth daily.      . fluticasone (FLONASE) 50 MCG/ACT nasal spray Place 2 sprays into the nose daily. 2 sprays in each nostril daily  16 g  1  . Garlic (ODOR FREE GARLIC) 100 MG TABS Take 2 tablets by mouth 2 (two) times daily.       Marland Kitchen latanoprost (XALATAN) 0.005 % ophthalmic solution Place 1 drop into both eyes at bedtime.       . meloxicam (MOBIC) 15 MG tablet Take 1 tablet (15 mg total) by mouth daily as needed for pain. With food  90 tablet  1  . potassium chloride (KLOR-CON 10) 10 MEQ tablet Take 1 tablet (10 mEq total) by mouth daily.  90 tablet  1  . SINGULAIR 10 MG tablet Take 10 mg by mouth at bedtime as needed.       . triamterene-hydrochlorothiazide (MAXZIDE-25) 37.5-25 MG per tablet Take 1 each (1 tablet total) by mouth daily.  30 tablet  5  . valsartan (DIOVAN) 160 MG tablet Take 1 tablet (160  mg total) by mouth daily.  90 tablet  0  . vitamin D, CHOLECALCIFEROL, 400 UNITS tablet Take 400 Units by mouth 2 (two) times daily.            Review of Systems Review of Systems  Constitutional: Negative for fever, appetite change, fatigue and unexpected weight change.  Eyes: Negative for pain and visual disturbance.  ENT neg for sinus pain or ST Respiratory: Negative for cough and shortness of breath.neg for wheezing   Cardiovascular: Negative for cp , pnd, orthopnea, pedal edema or sob on exertion    Gastrointestinal: Negative for nausea, diarrhea and constipation.  Genitourinary: Negative for urgency and frequency.  Skin: Negative for pallor or rash   Neurological: Negative for weakness, light-headedness, numbness and headaches.  Hematological: Negative for adenopathy. Does not bruise/bleed easily.  Psychiatric/Behavioral: Negative for dysphoric mood. The patient is not nervous/anxious.  pos for new stressors        Objective:   Physical Exam  Constitutional: She appears well-developed and well-nourished. No distress.  HENT:  Head: Normocephalic and atraumatic.  Right Ear: External ear normal.  Left Ear: External ear normal.  Mouth/Throat: Oropharynx is clear and moist. No oropharyngeal exudate.       Nares are pale and boggy No sinus tenderness  Eyes: Conjunctivae normal and EOM are normal. Pupils are equal, round, and reactive to light. Right eye exhibits no discharge. Left eye exhibits no discharge.  Neck: Normal range of motion. Neck supple. No JVD present. Carotid bruit is not present. No thyromegaly present.  Cardiovascular: Normal rate, regular rhythm, normal heart sounds and intact distal pulses.  Exam reveals no gallop.   No murmur heard. Pulmonary/Chest: Effort normal and breath sounds normal. No respiratory distress. She has no wheezes. She has no rales.  Abdominal: Soft. Bowel sounds are normal. She exhibits no distension, no abdominal bruit, no pulsatile midline  mass and no mass. There is no tenderness.  Musculoskeletal: She exhibits no edema and no tenderness.       No palp cords or tenderness  Lymphadenopathy:    She has no cervical adenopathy.  Neurological: She is  alert. She has normal reflexes. No cranial nerve deficit. She exhibits normal muscle tone. Coordination normal.  Skin: Skin is warm and dry. No rash noted. No erythema. No pallor.  Psychiatric: She has a normal mood and affect.          Assessment & Plan:

## 2012-05-15 NOTE — Assessment & Plan Note (Signed)
Nl EKG with no acute changes and rate in the 70s Disc poss caffeine as the cause Will stop that  Also stop caffeine or foods that bother her  Update if not starting to improve in 1-2 weeks or if worsening

## 2012-05-16 ENCOUNTER — Telehealth: Payer: Self-pay

## 2012-05-16 NOTE — Telephone Encounter (Signed)
Pt spoke with rep this AM about wellness form; section with BP, HT and WT and waist circumference and cotinine needs to be completed and refaxed. Pt said she does not smoke so does not need blood test done. Pt states waist circumference 27 1/2 ". Any questions contact pt.

## 2012-05-16 NOTE — Telephone Encounter (Signed)
Pt advise and she said she will get test done, since she is a [employer] employee she wants you to fax over orders to [facility] so she can have test done free, I advise pt that you are out of office today and she said we can just fax over orders for cotinine test tomorrow when you return

## 2012-05-16 NOTE — Telephone Encounter (Signed)
Insurance companies and employers generally require nicotine blood test- have her call whoever requires the paperwork to find out and let me know

## 2012-05-16 NOTE — Telephone Encounter (Signed)
Spoke with pt and advise pt I filled out the vital sign part but could not fill out the cotinine test for nicotine use, because we would have to do blood work to verify, pt doesn't want to get the blood work done and wants to see if you (Dr. Milinda Antis) would just look in her file and see she never answered yes to the tobacco use question and say that she is negative for tobacco use, I advise her that with out the test I couldn't answer the question but she wanted me to see if Dr. Milinda Antis would say she is negative, please advise

## 2012-05-16 NOTE — Telephone Encounter (Signed)
Pt said we have to fill out the cotinine test results part but she doesn't want to have to have the blood work done she just wants [place] to check that the cotinine test is negative since she said she doesn't smoke and it should be in our file that she doesn't smoke, please advise

## 2012-05-16 NOTE — Telephone Encounter (Signed)
I cannot legally do that, it would be fraudulent to indicate a test is negative if we have not performed the test

## 2012-05-17 ENCOUNTER — Other Ambulatory Visit: Payer: Self-pay | Admitting: Family Medicine

## 2012-05-17 NOTE — Telephone Encounter (Signed)
Orders faxed and pt notified

## 2012-05-17 NOTE — Telephone Encounter (Signed)
In IN box 

## 2012-05-20 ENCOUNTER — Telehealth: Payer: Self-pay

## 2012-05-20 NOTE — Telephone Encounter (Signed)
Pt left v/m requesting call back. Spoke with pt; she is at Dr Ophelia Charter office who did surgery on sinus less than yr ago.;pt thinks has sinus infection with sorethroat. Pt said she will see what Dr Chestine Spore says and if needs further assistance will call our office back.

## 2012-05-28 ENCOUNTER — Telehealth: Payer: Self-pay | Admitting: Family Medicine

## 2012-05-28 ENCOUNTER — Telehealth: Payer: Self-pay

## 2012-05-28 NOTE — Telephone Encounter (Signed)
Ok- is negative and form is done -- do not know why [facility] did not put it in epic or send it to [place] earlier

## 2012-05-28 NOTE — Telephone Encounter (Signed)
She can have some if we have them

## 2012-05-28 NOTE — Telephone Encounter (Signed)
Pt notified we have samples and she will pick them up today

## 2012-05-28 NOTE — Telephone Encounter (Signed)
I don't have results yet and was told that the particular test can take a while -could you please check in with [facility] and see if they expect a result soon? thanks

## 2012-05-28 NOTE — Telephone Encounter (Signed)
Pt wanted to know if you have received her lab results back for the cotinine test, her ins. form is still in my inbox and we are just waiting for results of that test before we fax it back, please advise

## 2012-05-28 NOTE — Telephone Encounter (Signed)
Pt request samples of Nexium 40 mg. Please advise.

## 2012-05-28 NOTE — Telephone Encounter (Signed)
Received results an placed results and insurance form in your inbox

## 2012-05-28 NOTE — Telephone Encounter (Signed)
Form faxed to pt's insurance and sent to be scanned

## 2012-06-04 ENCOUNTER — Encounter: Payer: Self-pay | Admitting: Family Medicine

## 2012-06-06 LAB — NICOTINE METABOLITE, SERUM: NICOTINE: NEGATIVE

## 2012-06-13 ENCOUNTER — Ambulatory Visit (AMBULATORY_SURGERY_CENTER): Payer: 59 | Admitting: *Deleted

## 2012-06-13 ENCOUNTER — Encounter: Payer: Self-pay | Admitting: Internal Medicine

## 2012-06-13 VITALS — Ht 62.0 in | Wt 164.0 lb

## 2012-06-13 DIAGNOSIS — Z800 Family history of malignant neoplasm of digestive organs: Secondary | ICD-10-CM

## 2012-06-13 DIAGNOSIS — Z1211 Encounter for screening for malignant neoplasm of colon: Secondary | ICD-10-CM

## 2012-06-13 MED ORDER — MOVIPREP 100 G PO SOLR: ORAL | Status: DC

## 2012-06-17 ENCOUNTER — Telehealth: Payer: Self-pay | Admitting: Internal Medicine

## 2012-06-17 ENCOUNTER — Telehealth: Payer: Self-pay | Admitting: Family Medicine

## 2012-06-17 DIAGNOSIS — E785 Hyperlipidemia, unspecified: Secondary | ICD-10-CM

## 2012-06-17 DIAGNOSIS — I10 Essential (primary) hypertension: Secondary | ICD-10-CM

## 2012-06-17 DIAGNOSIS — E876 Hypokalemia: Secondary | ICD-10-CM

## 2012-06-17 DIAGNOSIS — M8580 Other specified disorders of bone density and structure, unspecified site: Secondary | ICD-10-CM

## 2012-06-17 NOTE — Telephone Encounter (Signed)
Pt has a CPE on 06/28/12 an is a [employer] employee and wants lab orders faxed to the [facility] on [address] [address], their fax # is [phone]

## 2012-06-17 NOTE — Telephone Encounter (Signed)
Let pt know I sent the order electronically -thanks

## 2012-06-17 NOTE — Telephone Encounter (Signed)
Pt notified orders sent electronically to [facility]

## 2012-06-17 NOTE — Telephone Encounter (Signed)
Diet instructions reviewed with patient.  Told pt okay to proceed with procedure as planned.

## 2012-06-19 ENCOUNTER — Ambulatory Visit (AMBULATORY_SURGERY_CENTER): Payer: 59 | Admitting: Internal Medicine

## 2012-06-19 ENCOUNTER — Encounter: Payer: Self-pay | Admitting: Internal Medicine

## 2012-06-19 VITALS — BP 129/74 | HR 73 | Temp 98.3°F | Resp 17 | Ht 62.0 in | Wt 164.0 lb

## 2012-06-19 DIAGNOSIS — Z800 Family history of malignant neoplasm of digestive organs: Secondary | ICD-10-CM

## 2012-06-19 DIAGNOSIS — Z1211 Encounter for screening for malignant neoplasm of colon: Secondary | ICD-10-CM

## 2012-06-19 DIAGNOSIS — Z86010 Personal history of colonic polyps: Secondary | ICD-10-CM

## 2012-06-19 DIAGNOSIS — D126 Benign neoplasm of colon, unspecified: Secondary | ICD-10-CM

## 2012-06-19 LAB — CBC WITH DIFFERENTIAL/PLATELET
Basophils Absolute: 0.1 10*3/uL (ref 0.0–0.2)
Eosinophils Absolute: 0.3 10*3/uL (ref 0.0–0.4)
Immature Granulocytes: 0 % (ref 0–2)
Lymphs: 39 % (ref 14–46)
MCHC: 33.2 g/dL (ref 31.5–35.7)
Monocytes: 10 % (ref 4–12)
Neutrophils Relative %: 44 % (ref 40–74)
RDW: 13 % (ref 12.3–15.4)

## 2012-06-19 LAB — COMPREHENSIVE METABOLIC PANEL
ALT: 16 IU/L (ref 0–32)
Albumin: 4 g/dL (ref 3.6–4.8)
Alkaline Phosphatase: 101 IU/L (ref 39–117)
BUN/Creatinine Ratio: 17 (ref 11–26)
BUN: 15 mg/dL (ref 8–27)
CO2: 24 mmol/L (ref 19–28)
Chloride: 101 mmol/L (ref 97–108)
Glucose: 78 mg/dL (ref 65–99)
Potassium: 4.1 mmol/L (ref 3.5–5.2)
Total Protein: 6.8 g/dL (ref 6.0–8.5)

## 2012-06-19 LAB — LIPID PANEL
Cholesterol, Total: 193 mg/dL (ref 100–199)
VLDL Cholesterol Cal: 17 mg/dL (ref 5–40)

## 2012-06-19 LAB — TSH: TSH: 1.53 u[IU]/mL (ref 0.450–4.500)

## 2012-06-19 MED ORDER — SODIUM CHLORIDE 0.9 % IV SOLN
500.0000 mL | INTRAVENOUS | Status: DC
Start: 2012-06-19 — End: 2012-06-20

## 2012-06-19 NOTE — Op Note (Signed)
[facility] [address] [place], [address]   COLONOSCOPY PROCEDURE REPORT  PATIENT: Amy Herman, Amy Herman  MR#: [id number] BIRTHDATE: 24-Dec-1945 , 66  yrs. old GENDER: Female ENDOSCOPIST: Hart Carwin, MD REFERRED BY:  recall colonoscopy PROCEDURE DATE:  06/19/2012 PROCEDURE:   Colonoscopy with cold biopsy polypectomy ASA CLASS:   Class II INDICATIONS:Patient's immediate family history of colon cancer, Patient's personal history of colon polyps, and colon 2005. MEDICATIONS: MAC sedation, administered by CRNA and propofol (Diprivan) 300mg  IV  DESCRIPTION OF PROCEDURE:   After the risks and benefits and of the procedure were explained, informed consent was obtained.  A digital rectal exam revealed no abnormalities of the rectum.    The LB [id number]  endoscope was introduced through the anus and advanced to the cecum, which was identified by both the appendix and ileocecal valve .  The quality of the prep was good, using MoviPrep .  The instrument was then slowly withdrawn as the colon was fully examined.     COLON FINDINGS: A diminutive smooth flat polyp was found in the descending colon.  A polypectomy was performed with cold forceps. The resection was complete and the polyp tissue was completely retrieved.     Retroflexed views revealed no abnormalities.     The scope was then withdrawn from the patient and the procedure completed.  COMPLICATIONS: There were no complications. ENDOSCOPIC IMPRESSION: Diminutive flat polyp was found in the descending colon; polypectomy was performed with cold forceps  RECOMMENDATIONS: Await pathology results   REPEAT EXAM: In 5 year(s)  for Colonoscopy.  cc:  _______________________________ eSignedHart Carwin, MD 06/19/2012 3:24 PM

## 2012-06-19 NOTE — Patient Instructions (Addendum)
A handout was given to your care partner on polyps.  You may resume your current medications today.  Please call if any questions or concerns.    YOU HAD AN ENDOSCOPIC PROCEDURE TODAY AT THE [facility]: Refer to the procedure report that was given to you for any specific questions about what was found during the examination.  If the procedure report does not answer your questions, please call your gastroenterologist to clarify.  If you requested that your care partner not be given the details of your procedure findings, then the procedure report has been included in a sealed envelope for you to review at your convenience later.  YOU SHOULD EXPECT: Some feelings of bloating in the abdomen. Passage of more gas than usual.  Walking can help get rid of the air that was put into your GI tract during the procedure and reduce the bloating. If you had a lower endoscopy (such as a colonoscopy or flexible sigmoidoscopy) you may notice spotting of blood in your stool or on the toilet paper. If you underwent a bowel prep for your procedure, then you may not have a normal bowel movement for a few days.  DIET: Your first meal following the procedure should be a light meal and then it is ok to progress to your normal diet.  A half-sandwich or bowl of soup is an example of a good first meal.  Heavy or fried foods are harder to digest and may make you feel nauseous or bloated.  Likewise meals heavy in dairy and vegetables can cause extra gas to form and this can also increase the bloating.  Drink plenty of fluids but you should avoid alcoholic beverages for 24 hours.  ACTIVITY: Your care partner should take you home directly after the procedure.  You should plan to take it easy, moving slowly for the rest of the day.  You can resume normal activity the day after the procedure however you should NOT DRIVE or use heavy machinery for 24 hours (because of the sedation medicines used during the test).    SYMPTOMS  TO REPORT IMMEDIATELY: A gastroenterologist can be reached at any hour.  During normal business hours, 8:30 AM to 5:00 PM Monday through Friday, call [phone].  After hours and on weekends, please call the GI answering service at [phone] who will take a message and have the physician on call contact you.   Following lower endoscopy (colonoscopy or flexible sigmoidoscopy):  Excessive amounts of blood in the stool  Significant tenderness or worsening of abdominal pains  Swelling of the abdomen that is new, acute  Fever of 100F or higher    FOLLOW UP: If any biopsies were taken you will be contacted by phone or by letter within the next 1-3 weeks.  Call your gastroenterologist if you have not heard about the biopsies in 3 weeks.  Our staff will call the home number listed on your records the next business day following your procedure to check on you and address any questions or concerns that you may have at that time regarding the information given to you following your procedure. This is a courtesy call and so if there is no answer at the home number and we have not heard from you through the emergency physician on call, we will assume that you have returned to your regular daily activities without incident.  SIGNATURES/CONFIDENTIALITY: You and/or your care partner have signed paperwork which will be entered into your electronic medical record.    These signatures attest to the fact that that the information above on your After Visit Summary has been reviewed and is understood.  Full responsibility of the confidentiality of this discharge information lies with you and/or your care-partner.  

## 2012-06-19 NOTE — Progress Notes (Signed)
No complaints noted in the recovery room. Maw  Patient did not have preoperative order for IV antibiotic SSI prophylaxis. (G8918) Patient did not experience any of the following events: a burn prior to discharge; a fall within the facility; wrong site/side/patient/procedure/implant event; or a hospital transfer or hospital admission upon discharge from the facility. (G8907)  

## 2012-06-19 NOTE — Progress Notes (Signed)
NO EGG OR SOY ALLERGY. EWM 

## 2012-06-20 ENCOUNTER — Telehealth: Payer: Self-pay | Admitting: *Deleted

## 2012-06-20 NOTE — Telephone Encounter (Signed)
  Follow up Call-  Call back number 06/19/2012  Post procedure Call Back phone  # [phone]  Permission to leave phone message Yes     Patient questions:  Message left to call if necessary.

## 2012-06-21 ENCOUNTER — Encounter: Payer: 59 | Admitting: Internal Medicine

## 2012-06-27 ENCOUNTER — Telehealth: Payer: Self-pay

## 2012-06-27 NOTE — Telephone Encounter (Signed)
Ok, we will discuss alternatives tomorrow- have her bring a drug coverage booklet from her insurance if she has one

## 2012-06-27 NOTE — Telephone Encounter (Signed)
Pt notified that we don't have diovan, pt is going to call her pharm. And see how much the generic diovan cost before she gets [place] to send in Rx

## 2012-06-27 NOTE — Telephone Encounter (Signed)
Pt said that Diovan is to expensive, pt called local pharm. and for 10 pills until her mail order Rx comes is $50 pt can't afford that because her 3 month supply from her mail order was $150, pt wanted me to ask you if there is an alternative generic medication you can prescribe her until her Diovan arrives next week please advise

## 2012-06-27 NOTE — Telephone Encounter (Signed)
I doubt we have any- but if we do she can have them -otherwise please call in a month supply to pharmacy if necessary

## 2012-06-27 NOTE — Telephone Encounter (Signed)
Pt has CPE appt with you tomorrow 08/30/11 I advise pt that you will discuss this at that time, pt did want me to let you know that she took her last Diovan pill today so is out of med

## 2012-06-27 NOTE — Telephone Encounter (Signed)
Pt waiting for mail order Diovan refill and request samples of Diovan if available.Please advise.

## 2012-06-28 ENCOUNTER — Encounter: Payer: Self-pay | Admitting: Internal Medicine

## 2012-06-28 ENCOUNTER — Ambulatory Visit (INDEPENDENT_AMBULATORY_CARE_PROVIDER_SITE_OTHER): Payer: 59 | Admitting: Family Medicine

## 2012-06-28 ENCOUNTER — Encounter: Payer: Self-pay | Admitting: Family Medicine

## 2012-06-28 VITALS — BP 118/80 | HR 80 | Temp 98.3°F | Ht 61.0 in | Wt 161.8 lb

## 2012-06-28 DIAGNOSIS — E876 Hypokalemia: Secondary | ICD-10-CM

## 2012-06-28 DIAGNOSIS — M899 Disorder of bone, unspecified: Secondary | ICD-10-CM

## 2012-06-28 DIAGNOSIS — M949 Disorder of cartilage, unspecified: Secondary | ICD-10-CM

## 2012-06-28 DIAGNOSIS — E785 Hyperlipidemia, unspecified: Secondary | ICD-10-CM

## 2012-06-28 DIAGNOSIS — I10 Essential (primary) hypertension: Secondary | ICD-10-CM

## 2012-06-28 DIAGNOSIS — M8580 Other specified disorders of bone density and structure, unspecified site: Secondary | ICD-10-CM

## 2012-06-28 MED ORDER — LOSARTAN POTASSIUM 100 MG PO TABS: 100.0000 mg | ORAL_TABLET | Freq: Every day | ORAL | Status: DC

## 2012-06-28 NOTE — Assessment & Plan Note (Signed)
Disc goals for lipids and reasons to control them Rev labs with pt Rev low sat fat diet in detail Much imp with better diet and once weekly crestor 5 mg

## 2012-06-28 NOTE — Assessment & Plan Note (Signed)
Good dexa a year ago Ca and D rev D level tx  Good exercise  No falls

## 2012-06-28 NOTE — Assessment & Plan Note (Signed)
K ok currently on supplementation Rev labs No cramps

## 2012-06-28 NOTE — Assessment & Plan Note (Signed)
bp in fair control at this time  No changes needed  Disc lifstyle change with low sodium diet and exercise   Cannot afford diovan so will change to losartan 100 and f/u 6-8 weeks for re check Rev labs today

## 2012-06-28 NOTE — Telephone Encounter (Signed)
Pt notified to bring booklet if she can find it, if not she will call her ins. and see what alt. Medications they cover

## 2012-06-28 NOTE — Progress Notes (Signed)
Subjective:    Patient ID: Amy Herman, female    DOB: 16-Jan-1946, 66 y.o.   MRN: [id number]  HPI Here for check up of chronic medical conditions and to review health mt list   Is feeling good and doing well  She stopped her caffeine and feels better   Wt is down 3 lb with bmi of 30  bp is stable today  No cp or palpitations or headaches or edema  No side effects to medicines  BP Readings from Last 3 Encounters:  06/28/12 118/80  06/19/12 129/74  05/15/12 114/74    Can no longer afford diovan Thinks losartan is covered   Hx of osteopenia- but dex a 8/12 was nl  Vit D is 36 - take her ca and D regularly Does also exercise -needs to be more- she goes to [employer]  Hyperlipidemia No results found for this basename: CHOL   Lab Results  Component Value Date   HDL 61 06/18/2012   HDL 63 04/08/2012   Lab Results  Component Value Date   LDLCALC 115* 06/18/2012   LDLCALC 185* 04/08/2012   Lab Results  Component Value Date   TRIG 85 06/18/2012   TRIG 86 04/08/2012   Lab Results  Component Value Date   CHOLHDL 3.2 06/18/2012   No results found for this basename: LDLDIRECT   improved diet and taking garlic - takes crestor 1 day per week 5 mg   Hyperglycemia  a1c 6.1 in oct- she thinks that was from 2 steroid shots/ rounds from Dr [place] Callas - a fluke   Mood - very good happy and motivated   Falls -none at all   mammo 3/13 normal Nl self exam Nl pap 3 /13   06/14/12 colonosc - went well   Patient Active Problem List  Diagnosis  . HYPERLIPIDEMIA  . ESSENTIAL HYPERTENSION, BENIGN  . COLONIC POLYPS, HX OF  . Osteopenia  . Post-menopausal  . Hypokalemia  . Eustachian tube dysfunction  . Lumbar disc disease  . Joint pain  . Palpitations  . Nasal congestion   Past Medical History  Diagnosis Date  . Allergic rhinitis   . Hypertension   . GERD (gastroesophageal reflux disease)   . Osteoarthritis of knee   . Hyperlipidemia   . Bronchitis     more frequent in  the past  . Osteopenia   . Colon polyps     history of  . Glaucoma(365)     suspected  . Heart murmur    Past Surgical History  Procedure Date  . Carpal tunnel release   . Bunionectomy     bilateral-PIN LEFT GREAT TOE  . Knee arthroscopy     right  . Tubal ligation     bilateral  . Vein surgery 2006    legs  . Functional endoscopic sinus surgery     05-04-2011   History  Substance Use Topics  . Smoking status: Never Smoker   . Smokeless tobacco: Never Used     Comment: used to have secondary smoke exposure from husband  . Alcohol Use: No   Family History  Problem Relation Age of Onset  . Cancer Mother     colon, lungs, liver  . Colon cancer Mother 57  . Cancer Father     prostate  . Heart disease Father     A-fib  . Stroke Father     TIA's  . Alcohol abuse Brother   . Cancer Brother  pancreatic, smoker  . Diabetes Brother   . Diabetes Brother   . Stomach cancer Neg Hx    Allergies  Allergen Reactions  . Cefuroxime Axetil     REACTION: bumps on tounge  . Clarithromycin Other (See Comments)    Burning sensation to stomach  . Levofloxacin   . Rosuvastatin     REACTION: severe muscle pain   Current Outpatient Prescriptions on File Prior to Visit  Medication Sig Dispense Refill  . aspirin 81 MG EC tablet Take 81 mg by mouth daily.        . calcium citrate-vitamin D (CITRACAL+D) 315-200 MG-UNIT per tablet Take 1 tablet by mouth daily.        Marland Kitchen esomeprazole (NEXIUM) 40 MG capsule Take 1 capsule (40 mg total) by mouth daily before breakfast.  90 capsule  3  . fexofenadine (ALLEGRA) 60 MG tablet Take 60 mg by mouth daily.      . fluticasone (FLONASE) 50 MCG/ACT nasal spray Place 2 sprays into the nose daily. 2 sprays in each nostril daily  16 g  1  . Garlic (ODOR FREE GARLIC) 100 MG TABS Take 2 tablets by mouth 2 (two) times daily.       Marland Kitchen latanoprost (XALATAN) 0.005 % ophthalmic solution Place 1 drop into both eyes at bedtime.       . meloxicam (MOBIC) 15  MG tablet Take 1 tablet (15 mg total) by mouth daily as needed for pain. With food  90 tablet  1  . potassium chloride (KLOR-CON 10) 10 MEQ tablet Take 1 tablet (10 mEq total) by mouth daily.  90 tablet  1  . SINGULAIR 10 MG tablet Take 10 mg by mouth at bedtime as needed.       . triamterene-hydrochlorothiazide (MAXZIDE-25) 37.5-25 MG per tablet Take 1 each (1 tablet total) by mouth daily.  30 tablet  5  . valsartan (DIOVAN) 160 MG tablet Take 1 tablet (160 mg total) by mouth daily.  90 tablet  0  . vitamin D, CHOLECALCIFEROL, 400 UNITS tablet Take 400 Units by mouth 2 (two) times daily.            Review of Systems Review of Systems  Constitutional: Negative for fever, appetite change, fatigue and unexpected weight change.  Eyes: Negative for pain and visual disturbance.  Respiratory: Negative for cough and shortness of breath.   Cardiovascular: Negative for cp or palpitations    Gastrointestinal: Negative for nausea, diarrhea and constipation.  Genitourinary: Negative for urgency and frequency.  Skin: Negative for pallor or rash   Neurological: Negative for weakness, light-headedness, numbness and headaches.  Hematological: Negative for adenopathy. Does not bruise/bleed easily.  Psychiatric/Behavioral: Negative for dysphoric mood. The patient is not nervous/anxious.         Objective:   Physical Exam  Constitutional: She appears well-nourished. No distress.       overwt and well appearing   HENT:  Head: Normocephalic and atraumatic.  Mouth/Throat: Oropharynx is clear and moist.  Eyes: Conjunctivae normal and EOM are normal. Pupils are equal, round, and reactive to light. No scleral icterus.  Neck: Normal range of motion. Neck supple. No JVD present. Carotid bruit is not present. No thyromegaly present.  Cardiovascular: Normal rate and regular rhythm.   Pulmonary/Chest: Effort normal and breath sounds normal. No respiratory distress. She has no wheezes.  Abdominal: Soft. Bowel  sounds are normal. She exhibits no distension, no abdominal bruit and no mass. There is no tenderness.  Musculoskeletal:  She exhibits no edema and no tenderness.  Lymphadenopathy:    She has no cervical adenopathy.  Neurological: She is alert. She has normal reflexes. No cranial nerve deficit. She exhibits normal muscle tone. Coordination normal.  Skin: Skin is warm and dry. No rash noted. No erythema. No pallor.  Psychiatric: She has a normal mood and affect.          Assessment & Plan:

## 2012-06-28 NOTE — Patient Instructions (Addendum)
Start losartan 100 mg daily instead of diovan and update me if causes any side effects or problems Follow up in 6-8 weeks for visit to check your blood pressure  Keep taking care of yourself

## 2012-07-03 DIAGNOSIS — I83893 Varicose veins of bilateral lower extremities with other complications: Secondary | ICD-10-CM

## 2012-07-03 HISTORY — DX: Varicose veins of bilateral lower extremities with other complications: I83.893

## 2012-07-12 ENCOUNTER — Telehealth: Payer: Self-pay | Admitting: *Deleted

## 2012-07-12 NOTE — Telephone Encounter (Signed)
Pt notified and said she will call us back in a week

## 2012-07-12 NOTE — Telephone Encounter (Signed)
Have her hold it a week first and see if cough stops to make sure that is the cause before we start anything else please

## 2012-07-12 NOTE — Telephone Encounter (Signed)
Pt said since starting the losartan she has had a bad cough as a side effect, pt wanted to know if you could prescribe her another generic Rx that is similar to losartan because pt can't take the side effect of coughing anymore, please advise

## 2012-07-19 ENCOUNTER — Telehealth: Payer: Self-pay | Admitting: Family Medicine

## 2012-07-19 MED ORDER — AMLODIPINE BESYLATE 5 MG PO TABS: 5.0000 mg | ORAL_TABLET | Freq: Every day | ORAL | Status: DC

## 2012-07-19 NOTE — Telephone Encounter (Signed)
Pt.notified

## 2012-07-19 NOTE — Telephone Encounter (Signed)
I want to try amlodipine (a different class of med) 5 mg daily- if side effects let me know Keep feb appt  I sent to her pharmacy on [address]

## 2012-07-19 NOTE — Telephone Encounter (Signed)
Pt's cough went away after stopping the losartan, pt has been off med for over a week so would like new Rx called in to pharm. on file

## 2012-07-22 ENCOUNTER — Encounter: Payer: Self-pay | Admitting: Vascular Surgery

## 2012-07-22 ENCOUNTER — Other Ambulatory Visit: Payer: Self-pay | Admitting: *Deleted

## 2012-07-22 MED ORDER — TRIAMTERENE-HCTZ 37.5-25 MG PO TABS: 1.0000 | ORAL_TABLET | Freq: Every day | ORAL | Status: DC

## 2012-07-22 MED ORDER — POTASSIUM CHLORIDE ER 10 MEQ PO TBCR: 10.0000 meq | EXTENDED_RELEASE_TABLET | Freq: Every day | ORAL | Status: DC

## 2012-07-23 ENCOUNTER — Encounter: Payer: Self-pay | Admitting: Vascular Surgery

## 2012-07-23 ENCOUNTER — Ambulatory Visit (INDEPENDENT_AMBULATORY_CARE_PROVIDER_SITE_OTHER): Payer: 59 | Admitting: Vascular Surgery

## 2012-07-23 ENCOUNTER — Other Ambulatory Visit: Payer: Self-pay | Admitting: *Deleted

## 2012-07-23 VITALS — BP 151/90 | HR 78 | Resp 18 | Ht 62.0 in | Wt 156.0 lb

## 2012-07-23 DIAGNOSIS — I83893 Varicose veins of bilateral lower extremities with other complications: Secondary | ICD-10-CM | POA: Insufficient documentation

## 2012-07-23 MED ORDER — VALSARTAN 160 MG PO TABS: 160.0000 mg | ORAL_TABLET | Freq: Every day | ORAL | Status: DC

## 2012-07-23 NOTE — Telephone Encounter (Signed)
Correct dose (160mg ) Rx sent to pharm.

## 2012-07-23 NOTE — Telephone Encounter (Signed)
Here is px for diovan to send electronically- please verify with her that dose is correct  Thanks

## 2012-07-23 NOTE — Telephone Encounter (Signed)
Pt doesn't want to try the amlodipine because she doesn't want to have any side effects. Pt wants to go back to Diovan and needs a new Rx [facility]. Rx not on current med list, ok to refill?

## 2012-07-23 NOTE — Progress Notes (Signed)
[facility] [place]   Patient name: Amy Herman MRN: [id number] DOB: [birth year] Sex: female   Referred by: Ardyth Gal  Reason for referral:  Chief Complaint  Patient presents with  . Varicose Veins    CHECK VV AND LEFT GROIN AREA    HISTORY OF PRESENT ILLNESS: The patient is a 67 year old female with a long history of venous pathology. She does have a prior history of vein stripping in the past and more recently has been treated with sclerotherapy at [facility]. Her evaluation there had shown a soft tissue mass near the confluence of her common femoral vein on the left. She recently underwent repeat imaging of this and is referred here for further evaluation. I did have the actual images from [facility]. She denies any swelling of her left leg has been pleased with her sclerotherapy treatment. She has no known history of DVT.  Past Medical History  Diagnosis Date  . Allergic rhinitis   . Hypertension   . GERD (gastroesophageal reflux disease)   . Osteoarthritis of knee   . Hyperlipidemia   . Bronchitis     more frequent in the past  . Osteopenia   . Colon polyps     history of  . Glaucoma(365)     suspected  . Heart murmur     Past Surgical History  Procedure Date  . Carpal tunnel release   . Bunionectomy     bilateral-PIN LEFT GREAT TOE  . Knee arthroscopy     right  . Tubal ligation     bilateral  . Vein surgery 2006    legs  . Functional endoscopic sinus surgery     05-04-2011    History   Social History  . Marital Status: Married    Spouse Name: N/A    Number of Children: N/A  . Years of Education: N/A   Occupational History  . [employer]     over 30 years   Social History Main Topics  . Smoking status: Never Smoker   . Smokeless tobacco: Never Used     Comment: used to have secondary smoke exposure from husband  . Alcohol Use: No  . Drug Use: No  . Sexually Active: Not on file   Other Topics Concern  . Not on  file   Social History Narrative   Teaches arobics, takes spinning class.  Plans to retire from [employer] in next several years.    Family History  Problem Relation Age of Onset  . Cancer Mother     colon, lungs, liver  . Colon cancer Mother 89  . Cancer Father     prostate  . Heart disease Father     A-fib  . Stroke Father     TIA's  . Alcohol abuse Brother   . Cancer Brother     pancreatic, smoker  . Diabetes Brother   . Diabetes Brother   . Stomach cancer Neg Hx     Allergies as of 07/23/2012 - Review Complete 07/23/2012  Allergen Reaction Noted  . Cefuroxime axetil    . Clarithromycin Other (See Comments) 01/31/2011  . Levofloxacin  08/15/2011  . Losartan  07/19/2012  . Rosuvastatin  08/13/2009    Current Outpatient Prescriptions on File Prior to Visit  Medication Sig Dispense Refill  . aspirin 81 MG EC tablet Take 81 mg by mouth daily.        . calcium citrate-vitamin D (CITRACAL+D) 315-200 MG-UNIT per tablet Take 1 tablet  by mouth daily.        Marland Kitchen esomeprazole (NEXIUM) 40 MG capsule Take 1 capsule (40 mg total) by mouth daily before breakfast.  90 capsule  3  . fexofenadine (ALLEGRA) 60 MG tablet Take 60 mg by mouth daily.      . fluticasone (FLONASE) 50 MCG/ACT nasal spray Place 2 sprays into the nose daily. 2 sprays in each nostril daily  16 g  1  . Garlic (ODOR FREE GARLIC) 100 MG TABS Take 2 tablets by mouth 2 (two) times daily.       Marland Kitchen latanoprost (XALATAN) 0.005 % ophthalmic solution Place 1 drop into both eyes at bedtime.       . meloxicam (MOBIC) 15 MG tablet Take 1 tablet (15 mg total) by mouth daily as needed for pain. With food  90 tablet  1  . potassium chloride (KLOR-CON 10) 10 MEQ tablet Take 1 tablet (10 mEq total) by mouth daily.  90 tablet  1  . rosuvastatin (CRESTOR) 5 MG tablet Take 5 mg by mouth once a week.      Marland Kitchen SINGULAIR 10 MG tablet Take 10 mg by mouth at bedtime as needed.       . triamterene-hydrochlorothiazide (MAXZIDE-25) 37.5-25 MG per  tablet Take 1 each (1 tablet total) by mouth daily.  90 tablet  1  . vitamin D, CHOLECALCIFEROL, 400 UNITS tablet Take 400 Units by mouth 2 (two) times daily.        Marland Kitchen amLODipine (NORVASC) 5 MG tablet Take 1 tablet (5 mg total) by mouth daily.  30 tablet  11     REVIEW OF SYSTEMS:  Positives indicated with an "X"  CARDIOVASCULAR:  [ ]  chest pain   [ ]  chest pressure   [ ]  palpitations   [ ]  orthopnea   [ ]  dyspnea on exertion   [ ]  claudication   [ ]  rest pain   [ ]  DVT   [ ]  phlebitis PULMONARY:   [ ]  productive cough   [ ]  asthma   [ ]  wheezing NEUROLOGIC:   [ ]  weakness  [ ]  paresthesias  [ ]  aphasia  [ ]  amaurosis  [ ]  dizziness HEMATOLOGIC:   [ ]  bleeding problems   [ ]  clotting disorders MUSCULOSKELETAL:  [ ]  joint pain   [ ]  joint swelling GASTROINTESTINAL: [ ]   blood in stool  [ ]   hematemesis GENITOURINARY:  [ ]   dysuria  [ ]   hematuria PSYCHIATRIC:  [ ]  history of major depression INTEGUMENTARY:  [ ]  rashes  [ ]  ulcers CONSTITUTIONAL:  [ ]  fever   [ ]  chills  PHYSICAL EXAMINATION:  General: The patient is a well-nourished female, in no acute distress. Vital signs are BP 151/90  Pulse 78  Resp 18  Ht 5\' 2"  (1.575 m)  Wt 156 lb (70.761 kg)  BMI 28.53 kg/m2 Pulmonary: There is a good air exchange bilaterally  Abdomen: Soft and non-tender  Musculoskeletal: There are no major deformities.  There is no significant extremity pain. Neurologic: No focal weakness or paresthesias are detected, Skin: There are no ulcer or rashes noted. Psychiatric: The patient has normal affect. Cardiovascular: There is a regular rate and rhythm without significant murmur appreciated. Pulse status: 2+ radial and 2+ dorsalis pedis pulses bilaterally   I reviewed her images from [facility] and looked at this area myself with SonoSite ultrasound. She does appear to have a soft tissue mass which is pressing on the common femoral  vein.  Impression and Plan:  Stable 2 centimeter fullness along  side her common femoral vein on the left. This is been stable for at least 5 years with serial studies at [facility]. I suspect this most likely represents a lipoma. I would not recommend any further evaluation or treatment since this has had no change over many years. The patient was reassured this discussion will see [place] in an as-needed basis    Sabrina Keough [facility] [place] Office: [phone]

## 2012-07-26 NOTE — Telephone Encounter (Signed)
Pt didn't want to try the amlodipine because she didn't want to have any new side effects, pt

## 2012-07-26 NOTE — Telephone Encounter (Signed)
Pt said [facility] did received Diovan rx but  [facility] will not allow balance over $ 100. And Diovan will cost $ 140. Pt cannot order until next Friday. Pt wants to know if can get samples for Diovan 160mg . If med called to local pharmacy will cost $ 50. And pt cannot afford that. Pt is out of med. Pt has already tried Losartan since is generic but that caused cough.Please advise.

## 2012-07-26 NOTE — Telephone Encounter (Signed)
We do not have samples  Perhaps she would like to try amlodipine like I orignially proposed- if not - have her follow up and bring her insurance coverage list so we know what she can and cannot afford since I do not have acess to that info thanks

## 2012-07-26 NOTE — Telephone Encounter (Signed)
Pt didn't want to try the amlodipine because she didn't want to have any new side effects, pt said she is going to take the losartan she has at home and just deal with the cough until she can order her Diovan

## 2012-08-21 ENCOUNTER — Ambulatory Visit: Payer: 59 | Admitting: Family Medicine

## 2012-09-29 ENCOUNTER — Other Ambulatory Visit: Payer: Self-pay | Admitting: Family Medicine

## 2012-10-30 DIAGNOSIS — Z1231 Encounter for screening mammogram for malignant neoplasm of breast: Secondary | ICD-10-CM | POA: Diagnosis not present

## 2012-12-10 ENCOUNTER — Ambulatory Visit: Payer: Self-pay | Admitting: Otolaryngology

## 2012-12-10 DIAGNOSIS — R059 Cough, unspecified: Secondary | ICD-10-CM | POA: Diagnosis not present

## 2012-12-27 ENCOUNTER — Ambulatory Visit: Payer: 59 | Admitting: Internal Medicine

## 2013-02-05 ENCOUNTER — Other Ambulatory Visit: Payer: Self-pay | Admitting: *Deleted

## 2013-02-05 MED ORDER — POTASSIUM CHLORIDE ER 10 MEQ PO TBCR: 10.0000 meq | EXTENDED_RELEASE_TABLET | Freq: Every day | ORAL | Status: DC

## 2013-02-17 ENCOUNTER — Other Ambulatory Visit: Payer: Self-pay

## 2013-02-17 MED ORDER — POTASSIUM CHLORIDE ER 10 MEQ PO TBCR: 10.0000 meq | EXTENDED_RELEASE_TABLET | Freq: Every day | ORAL | Status: DC

## 2013-02-17 NOTE — Telephone Encounter (Signed)
Pt called back and request # 30 Klor con to [facility] while waiting on mail order pharmacy. Advised done.

## 2013-02-17 NOTE — Telephone Encounter (Signed)
Pt called status of refill for potassium; advised [facility] 02/05/13; pt will ck with pharmacy.

## 2013-03-04 ENCOUNTER — Other Ambulatory Visit: Payer: Self-pay | Admitting: Family Medicine

## 2013-03-05 ENCOUNTER — Other Ambulatory Visit: Payer: Self-pay | Admitting: *Deleted

## 2013-03-05 MED ORDER — MELOXICAM 15 MG PO TABS: 15.0000 mg | ORAL_TABLET | Freq: Every day | ORAL | Status: DC | PRN

## 2013-03-05 NOTE — Telephone Encounter (Signed)
Please give her 72 with 1 ref, thanks

## 2013-03-05 NOTE — Telephone Encounter (Signed)
done

## 2013-03-05 NOTE — Telephone Encounter (Signed)
Requesting 90 day for mail order, is it ok to refill?

## 2013-04-10 ENCOUNTER — Ambulatory Visit (INDEPENDENT_AMBULATORY_CARE_PROVIDER_SITE_OTHER): Payer: Medicare Other

## 2013-04-10 DIAGNOSIS — Z23 Encounter for immunization: Secondary | ICD-10-CM

## 2013-06-02 ENCOUNTER — Other Ambulatory Visit: Payer: Self-pay | Admitting: Family Medicine

## 2013-06-02 NOTE — Telephone Encounter (Signed)
Electronic refill request, no recent/future appt., please advise  

## 2013-06-02 NOTE — Telephone Encounter (Signed)
Please schedule a follow up appt in late winter and refill until then

## 2013-06-06 NOTE — Telephone Encounter (Signed)
Left voicemail requesting pt to call office 

## 2013-06-09 ENCOUNTER — Telehealth: Payer: Self-pay | Admitting: Family Medicine

## 2013-06-09 ENCOUNTER — Other Ambulatory Visit: Payer: Self-pay

## 2013-06-09 MED ORDER — TRIAMTERENE-HCTZ 37.5-25 MG PO TABS: ORAL_TABLET | ORAL | Status: DC

## 2013-06-09 NOTE — Telephone Encounter (Signed)
Pt advised will hold message for shapale, not sure why pt was called

## 2013-06-09 NOTE — Telephone Encounter (Signed)
Pt states that Dr. Royden Purl assistant called her today.  She is returning our call.

## 2013-06-10 NOTE — Telephone Encounter (Signed)
Addressed under Rx

## 2013-06-10 NOTE — Telephone Encounter (Signed)
appt scheduled and med refilled 

## 2013-06-18 ENCOUNTER — Encounter: Payer: Self-pay | Admitting: Family Medicine

## 2013-06-18 ENCOUNTER — Ambulatory Visit (INDEPENDENT_AMBULATORY_CARE_PROVIDER_SITE_OTHER): Payer: Medicare Other | Admitting: Family Medicine

## 2013-06-18 VITALS — BP 130/82 | HR 77 | Temp 98.0°F | Ht 62.0 in | Wt 152.8 lb

## 2013-06-18 DIAGNOSIS — E876 Hypokalemia: Secondary | ICD-10-CM

## 2013-06-18 DIAGNOSIS — K219 Gastro-esophageal reflux disease without esophagitis: Secondary | ICD-10-CM

## 2013-06-18 DIAGNOSIS — I10 Essential (primary) hypertension: Secondary | ICD-10-CM

## 2013-06-18 DIAGNOSIS — E785 Hyperlipidemia, unspecified: Secondary | ICD-10-CM

## 2013-06-18 MED ORDER — TRIAMTERENE-HCTZ 37.5-25 MG PO TABS: ORAL_TABLET | ORAL | Status: DC

## 2013-06-18 MED ORDER — POTASSIUM CHLORIDE ER 10 MEQ PO TBCR: 10.0000 meq | EXTENDED_RELEASE_TABLET | Freq: Every day | ORAL | Status: DC

## 2013-06-18 MED ORDER — VALSARTAN 160 MG PO TABS: 160.0000 mg | ORAL_TABLET | Freq: Every day | ORAL | Status: DC

## 2013-06-18 MED ORDER — ROSUVASTATIN CALCIUM 5 MG PO TABS: 5.0000 mg | ORAL_TABLET | ORAL | Status: DC

## 2013-06-18 MED ORDER — ESOMEPRAZOLE MAGNESIUM 40 MG PO CPDR: 40.0000 mg | DELAYED_RELEASE_CAPSULE | Freq: Every day | ORAL | Status: DC

## 2013-06-18 MED ORDER — MELOXICAM 15 MG PO TABS: 15.0000 mg | ORAL_TABLET | Freq: Every day | ORAL | Status: DC | PRN

## 2013-06-18 NOTE — Progress Notes (Signed)
Subjective:    Patient ID: Amy Herman, female    DOB: June 05, 1946, 67 y.o.   MRN: [id number]  HPI Here for f/u of chronic medical problems  She is doing well and feeling well overall   Some sinus issues   Some acid reflux - despite otc ppi- and she went to nexium otc -- 2 of the 20 mg - even then she is still having problems with acid coming up into mouth -she can taste it and the inside of nose burns  Not all the time -on and off  Started after eating sausage with peppers -then got sick   Sees ENT in Jan for CT to check after surgery   bp is stable today  No cp or palpitations or headaches or edema  No side effects to medicines  BP Readings from Last 3 Encounters:  06/18/13 130/82  07/23/12 151/90  06/28/12 118/80     Wt is down 4 lb with bmi of 27 She is taking care of herself- getting ready to go to some weddings She has been going to the gym and working outdoors and eating well   Hyperlipidemia Due for labs Lab Results  Component Value Date   HDL 61 06/18/2012   LDLCALC 115* 06/18/2012   TRIG 85 06/18/2012   CHOLHDL 3.2 06/18/2012   Statin and diet - eats fairly low fat   Hypokalemia   Chemistry      Component Value Date/Time   NA 143 06/18/2012 1458   K 4.1 06/18/2012 1458   CL 101 06/18/2012 1458   CO2 24 06/18/2012 1458   BUN 15 06/18/2012 1458   CREATININE 0.89 06/18/2012 1458      Component Value Date/Time   CALCIUM 9.7 06/18/2012 1458   ALKPHOS 101 06/18/2012 1458   AST 18 06/18/2012 1458   ALT 16 06/18/2012 1458   BILITOT 0.3 06/18/2012 1458      Due for re check on klor con -due for a check on this No muscle cramping   Hx of osteopenia but dexa 8/12 was in the nl range On ca and D  Patient Active Problem List   Diagnosis Date Noted  . Varicose veins of lower extremities with other complications 07/23/2012  . Palpitations 05/15/2012  . Nasal congestion 05/15/2012  . Eustachian tube dysfunction 08/15/2011  . Lumbar disc disease  08/15/2011  . Joint pain 08/15/2011  . Osteopenia 01/31/2011  . Post-menopausal 01/31/2011  . Hypokalemia 01/31/2011  . HYPERLIPIDEMIA 03/24/2008  . ESSENTIAL HYPERTENSION, BENIGN 03/24/2008  . COLONIC POLYPS, HX OF 03/24/2008   Past Medical History  Diagnosis Date  . Allergic rhinitis   . Hypertension   . GERD (gastroesophageal reflux disease)   . Osteoarthritis of knee   . Hyperlipidemia   . Bronchitis     more frequent in the past  . Osteopenia   . Colon polyps     history of  . Glaucoma     suspected  . Heart murmur    Past Surgical History  Procedure Laterality Date  . Carpal tunnel release    . Bunionectomy      bilateral-PIN LEFT GREAT TOE  . Knee arthroscopy      right  . Tubal ligation      bilateral  . Vein surgery  2006    legs  . Functional endoscopic sinus surgery      05-04-2011   History  Substance Use Topics  . Smoking status: Never Smoker   .  Smokeless tobacco: Never Used     Comment: used to have secondary smoke exposure from husband  . Alcohol Use: No   Family History  Problem Relation Age of Onset  . Cancer Mother     colon, lungs, liver  . Colon cancer Mother 79  . Cancer Father     prostate  . Heart disease Father     A-fib  . Stroke Father     TIA's  . Alcohol abuse Brother   . Cancer Brother     pancreatic, smoker  . Diabetes Brother   . Diabetes Brother   . Stomach cancer Neg Hx    Allergies  Allergen Reactions  . Cefuroxime Axetil     REACTION: bumps on tounge  . Clarithromycin Other (See Comments)    Burning sensation to stomach  . Levofloxacin   . Losartan     cough  . Rosuvastatin     REACTION: severe muscle pain   Current Outpatient Prescriptions on File Prior to Visit  Medication Sig Dispense Refill  . aspirin 81 MG EC tablet Take 81 mg by mouth daily.        . calcium citrate-vitamin D (CITRACAL+D) 315-200 MG-UNIT per tablet Take 1 tablet by mouth daily.        Marland Kitchen esomeprazole (NEXIUM) 40 MG capsule Take 1  capsule (40 mg total) by mouth daily before breakfast.  90 capsule  3  . fexofenadine (ALLEGRA) 60 MG tablet Take 60 mg by mouth daily.      . fluticasone (FLONASE) 50 MCG/ACT nasal spray Place 2 sprays into the nose daily. 2 sprays in each nostril daily  16 g  1  . Garlic (ODOR FREE GARLIC) 100 MG TABS Take 2 tablets by mouth 2 (two) times daily.       . meloxicam (MOBIC) 15 MG tablet Take 1 tablet (15 mg total) by mouth daily as needed for pain. With food  90 tablet  1  . potassium chloride (KLOR-CON 10) 10 MEQ tablet Take 1 tablet (10 mEq total) by mouth daily.  30 tablet  0  . rosuvastatin (CRESTOR) 5 MG tablet Take 5 mg by mouth once a week.      Marland Kitchen SINGULAIR 10 MG tablet Take 10 mg by mouth at bedtime as needed.       . triamterene-hydrochlorothiazide (MAXZIDE-25) 37.5-25 MG per tablet Take 1 tablet by mouth  daily  90 tablet  1  . triamterene-hydrochlorothiazide (MAXZIDE-25) 37.5-25 MG per tablet TAKE 1 EACH (1 TABLET TOTAL) BY MOUTH DAILY.  30 tablet  3  . valsartan (DIOVAN) 160 MG tablet Take 1 tablet (160 mg total) by mouth daily.  90 tablet  3  . vitamin D, CHOLECALCIFEROL, 400 UNITS tablet Take 400 Units by mouth 2 (two) times daily.         No current facility-administered medications on file prior to visit.    Review of Systems     Objective:   Physical Exam  Constitutional: She appears well-developed and well-nourished. No distress.  HENT:  Head: Normocephalic and atraumatic.  Right Ear: External ear normal.  Left Ear: External ear normal.  Nose: Nose normal.  Mouth/Throat: Oropharynx is clear and moist.  Eyes: Conjunctivae and EOM are normal. Pupils are equal, round, and reactive to light. Right eye exhibits no discharge. Left eye exhibits no discharge. No scleral icterus.  Neck: Normal range of motion. Neck supple. No JVD present. Carotid bruit is not present. No thyromegaly present.  Cardiovascular: Normal  rate, regular rhythm, normal heart sounds and intact distal  pulses.  Exam reveals no gallop.   Pulmonary/Chest: Effort normal and breath sounds normal. No respiratory distress. She has no wheezes. She has no rales.  Abdominal: Soft. Bowel sounds are normal. She exhibits no distension, no abdominal bruit and no mass. There is no tenderness.  Musculoskeletal: She exhibits no edema and no tenderness.  Lymphadenopathy:    She has no cervical adenopathy.  Neurological: She is alert. She has normal reflexes. No cranial nerve deficit. She exhibits normal muscle tone. Coordination normal.  Skin: Skin is warm and dry. No rash noted. No erythema. No pallor.  Psychiatric: She has a normal mood and affect.          Assessment & Plan:

## 2013-06-18 NOTE — Progress Notes (Signed)
Pre-visit discussion using our clinic review tool. No additional management support is needed unless otherwise documented below in the visit note.  

## 2013-06-18 NOTE — Patient Instructions (Signed)
Try px Nexium 40 mg once daily - and if that does not get rid of symptoms - let me know and we will set you up with GI  Labs today  Take care of yourself    Diet for Gastroesophageal Reflux Disease, Adult Reflux (acid reflux) is when acid from your stomach flows up into the esophagus. When acid comes in contact with the esophagus, the acid causes irritation and soreness (inflammation) in the esophagus. When reflux happens often or so severely that it causes damage to the esophagus, it is called gastroesophageal reflux disease (GERD). Nutrition therapy can help ease the discomfort of GERD. FOODS OR DRINKS TO AVOID OR LIMIT  Smoking or chewing tobacco. Nicotine is one of the most potent stimulants to acid production in the gastrointestinal tract.  Caffeinated and decaffeinated coffee and black tea.  Regular or low-calorie carbonated beverages or energy drinks (caffeine-free carbonated beverages are allowed).   Strong spices, such as black pepper, white pepper, red pepper, cayenne, curry powder, and chili powder.  Peppermint or spearmint.  Chocolate.  High-fat foods, including meats and fried foods. Extra added fats including oils, butter, salad dressings, and nuts. Limit these to less than 8 tsp per day.  Fruits and vegetables if they are not tolerated, such as citrus fruits or tomatoes.  Alcohol.  Any food that seems to aggravate your condition. If you have questions regarding your diet, call your caregiver or a registered dietitian. OTHER THINGS THAT MAY HELP GERD INCLUDE:   Eating your meals slowly, in a relaxed setting.  Eating 5 to 6 small meals per day instead of 3 large meals.  Eliminating food for a period of time if it causes distress.  Not lying down until 3 hours after eating a meal.  Keeping the head of your bed raised 6 to 9 inches (15 to 23 cm) by using a foam wedge or blocks under the legs of the bed. Lying flat may make symptoms worse.  Being physically active.  Weight loss may be helpful in reducing reflux in overweight or obese adults.  Wear loose fitting clothing EXAMPLE MEAL PLAN This meal plan is approximately 2,000 calories based on https://www.bernard.org/ meal planning guidelines. Breakfast   cup cooked oatmeal.  1 cup strawberries.  1 cup low-fat milk.  1 oz almonds. Snack  1 cup cucumber slices.  6 oz yogurt (made from low-fat or fat-free milk). Lunch  2 slice whole-wheat bread.  2 oz sliced [place].  2 tsp mayonnaise.  1 cup blueberries.  1 cup snap peas. Snack  6 whole-wheat crackers.  1 oz string cheese. Dinner   cup brown rice.  1 cup mixed veggies.  1 tsp olive oil.  3 oz grilled fish. Document Released: 06/19/2005 Document Revised: 09/11/2011 Document Reviewed: 05/05/2011 [facility] Patient Information 2014 [place], [place].

## 2013-06-19 LAB — COMPREHENSIVE METABOLIC PANEL
ALT: 14 IU/L (ref 0–32)
AST: 18 IU/L (ref 0–40)
Albumin/Globulin Ratio: 1.4 (ref 1.1–2.5)
Albumin: 4.6 g/dL (ref 3.6–4.8)
Alkaline Phosphatase: 105 IU/L (ref 39–117)
BUN/Creatinine Ratio: 16 (ref 11–26)
BUN: 15 mg/dL (ref 8–27)
CO2: 22 mmol/L (ref 18–29)
Calcium: 9.8 mg/dL (ref 8.6–10.2)
Chloride: 97 mmol/L (ref 97–108)
Creatinine, Ser: 0.95 mg/dL (ref 0.57–1.00)
GFR calc Af Amer: 71 mL/min/{1.73_m2} (ref >59)
GFR calc non Af Amer: 62 mL/min/{1.73_m2} (ref >59)
Globulin, Total: 3.4 g/dL (ref 1.5–4.5)
Glucose: 94 mg/dL (ref 65–99)
Potassium: 3.4 mmol/L — ABNORMAL LOW (ref 3.5–5.2)
Sodium: 140 mmol/L (ref 134–144)
Total Bilirubin: 0.3 mg/dL (ref 0.0–1.2)
Total Protein: 8 g/dL (ref 6.0–8.5)

## 2013-06-19 LAB — LIPID PANEL
Chol/HDL Ratio: 3.7 ratio units (ref 0.0–4.4)
Cholesterol, Total: 238 mg/dL — ABNORMAL HIGH (ref 100–199)
HDL: 65 mg/dL (ref >39)
LDL Calculated: 157 mg/dL — ABNORMAL HIGH (ref 0–99)
Triglycerides: 79 mg/dL (ref 0–149)
VLDL Cholesterol Cal: 16 mg/dL (ref 5–40)

## 2013-06-19 LAB — TSH: TSH: 1.27 u[IU]/mL (ref 0.450–4.500)

## 2013-06-19 LAB — CBC WITH DIFFERENTIAL/PLATELET
Basos: 2 %
Eosinophils Absolute: 0.2 10*3/uL (ref 0.0–0.4)
HCT: 39.3 % (ref 34.0–46.6)
Hemoglobin: 13.1 g/dL (ref 11.1–15.9)
Immature Granulocytes: 0 %
Lymphs: 45 %
MCH: 29.7 pg (ref 26.6–33.0)
MCV: 89 fL (ref 79–97)
Monocytes Absolute: 0.4 10*3/uL (ref 0.1–0.9)
Neutrophils Absolute: 1.9 10*3/uL (ref 1.4–7.0)
RBC: 4.41 x10E6/uL (ref 3.77–5.28)

## 2013-06-19 NOTE — Assessment & Plan Note (Signed)
BP: 130/82 mmHg  bp in fair control at this time  No changes needed Disc lifstyle change with low sodium diet and exercise  Commended on good lifestyle  Lab today

## 2013-06-19 NOTE — Assessment & Plan Note (Signed)
Lab today  No missed doses  No muscle cramps

## 2013-06-19 NOTE — Assessment & Plan Note (Signed)
Worse lately with some nausea and acid taste in mouth/ burning in nose Has ENT f/u upcoming  On nexium otc- will change to nexium 40 mg daily px If no imp will call for ref to GI Given info on gerd diet

## 2013-06-19 NOTE — Assessment & Plan Note (Signed)
Statin and good diet Disc goals for lipids and reasons to control them Rev labs with pt from last draw Rev low sat fat diet in detail  Lab today

## 2013-06-24 ENCOUNTER — Encounter: Payer: Self-pay | Admitting: *Deleted

## 2013-06-24 ENCOUNTER — Other Ambulatory Visit: Payer: Self-pay | Admitting: *Deleted

## 2013-06-24 MED ORDER — POTASSIUM CHLORIDE ER 10 MEQ PO TBCR: 20.0000 meq | EXTENDED_RELEASE_TABLET | Freq: Every day | ORAL | Status: DC

## 2013-07-08 DIAGNOSIS — J309 Allergic rhinitis, unspecified: Secondary | ICD-10-CM | POA: Diagnosis not present

## 2013-07-11 DIAGNOSIS — J329 Chronic sinusitis, unspecified: Secondary | ICD-10-CM | POA: Diagnosis not present

## 2013-07-11 DIAGNOSIS — H169 Unspecified keratitis: Secondary | ICD-10-CM | POA: Diagnosis not present

## 2013-07-11 DIAGNOSIS — J343 Hypertrophy of nasal turbinates: Secondary | ICD-10-CM | POA: Diagnosis not present

## 2013-07-17 ENCOUNTER — Telehealth: Payer: Self-pay | Admitting: Family Medicine

## 2013-07-17 DIAGNOSIS — J309 Allergic rhinitis, unspecified: Secondary | ICD-10-CM | POA: Diagnosis not present

## 2013-07-17 NOTE — Telephone Encounter (Signed)
Pt having foot surgery.  She brought in form that they said her PCP needs to fill out.  Please call patient when ready [phone] mobile Home [phone]  Can leave a message on either phone per patient.

## 2013-07-18 NOTE — Telephone Encounter (Signed)
Pt coming in Monday 07/21/13 at 11:45 for her pre-opt/EKG appt, form placed back on your desk (where future appt. paperwork is)

## 2013-07-18 NOTE — Telephone Encounter (Signed)
I filled out some of her form- which is quite extensive - and saw that an ekg is needed within the year - so she needs an appt with me - will do EKG and I need to rev surgical hx with her also

## 2013-07-18 NOTE — Telephone Encounter (Signed)
Form in your inbox 

## 2013-07-21 ENCOUNTER — Ambulatory Visit (INDEPENDENT_AMBULATORY_CARE_PROVIDER_SITE_OTHER): Payer: Medicare Other | Admitting: Family Medicine

## 2013-07-21 ENCOUNTER — Encounter: Payer: Self-pay | Admitting: Family Medicine

## 2013-07-21 VITALS — BP 116/72 | HR 81 | Temp 98.3°F | Ht 62.0 in | Wt 154.0 lb

## 2013-07-21 DIAGNOSIS — Z01810 Encounter for preprocedural cardiovascular examination: Secondary | ICD-10-CM | POA: Diagnosis not present

## 2013-07-21 NOTE — Progress Notes (Signed)
Subjective:    Patient ID: Amy Herman, female    DOB: 1945/11/26, 68 y.o.   MRN: [id number]  HPI Here for pre op clearance for a hammar toe  Dr Elvina Mattes  Will have several hammer toes on R foot and also a ca deposit on her R foot - symptomatic with callous formation  She is unsure what kind of anesthesia  No recent hospitalizations  No recent uti at all   Has her pre op visit there on 2/4   No problems with anesthesia in the past    (nausea only one time ) Otherwise does very well   EKG looks good - nl NSR rate 71   Patient Active Problem List   Diagnosis Date Noted  . Pre-operative cardiovascular examination 07/21/2013  . GERD (gastroesophageal reflux disease) 06/18/2013  . Varicose veins of lower extremities with other complications 15/17/6160  . Eustachian tube dysfunction 08/15/2011  . Lumbar disc disease 08/15/2011  . Joint pain 08/15/2011  . Osteopenia 01/31/2011  . Post-menopausal 01/31/2011  . Hypokalemia 01/31/2011  . HYPERLIPIDEMIA 03/24/2008  . ESSENTIAL HYPERTENSION, BENIGN 03/24/2008  . COLONIC POLYPS, HX OF 03/24/2008   Past Medical History  Diagnosis Date  . Allergic rhinitis   . Hypertension   . GERD (gastroesophageal reflux disease)   . Osteoarthritis of knee   . Hyperlipidemia   . Bronchitis     more frequent in the past  . Osteopenia   . Colon polyps     history of  . Glaucoma     suspected  . Heart murmur    Past Surgical History  Procedure Laterality Date  . Carpal tunnel release    . Bunionectomy      bilateral-PIN LEFT GREAT TOE  . Knee arthroscopy      right  . Tubal ligation      bilateral  . Vein surgery  2006    legs  . Functional endoscopic sinus surgery      05-04-2011   History  Substance Use Topics  . Smoking status: Never Smoker   . Smokeless tobacco: Never Used     Comment: used to have secondary smoke exposure from husband  . Alcohol Use: No   Family History  Problem Relation Age of Onset  . Cancer  Mother     colon, lungs, liver  . Colon cancer Mother 31  . Cancer Father     prostate  . Heart disease Father     A-fib  . Stroke Father     TIA's  . Alcohol abuse Brother   . Cancer Brother     pancreatic, smoker  . Diabetes Brother   . Diabetes Brother   . Stomach cancer Neg Hx    Allergies  Allergen Reactions  . Cefuroxime Axetil     REACTION: bumps on tounge  . Clarithromycin Other (See Comments)    Burning sensation to stomach  . Levofloxacin   . Losartan     cough  . Rosuvastatin     REACTION: severe muscle pain   Current Outpatient Prescriptions on File Prior to Visit  Medication Sig Dispense Refill  . aspirin 81 MG EC tablet Take 81 mg by mouth daily.        . calcium citrate-vitamin D (CITRACAL+D) 315-200 MG-UNIT per tablet Take 1 tablet by mouth daily.        Marland Kitchen esomeprazole (NEXIUM) 40 MG capsule Take 1 capsule (40 mg total) by mouth daily before breakfast.  90 capsule  3  . fexofenadine (ALLEGRA) 60 MG tablet Take 60 mg by mouth daily.      . fluticasone (FLONASE) 50 MCG/ACT nasal spray Place 2 sprays into the nose daily. 2 sprays in each nostril daily  16 g  1  . Garlic (ODOR FREE GARLIC) 841 MG TABS Take 2 tablets by mouth 2 (two) times daily.       . meloxicam (MOBIC) 15 MG tablet Take 1 tablet (15 mg total) by mouth daily as needed for pain. With food  90 tablet  1  . potassium chloride (KLOR-CON 10) 10 MEQ tablet Take 2 tablets (20 mEq total) by mouth daily.  180 tablet  1  . rosuvastatin (CRESTOR) 5 MG tablet Take 1 tablet (5 mg total) by mouth once a week.  12 tablet  3  . SINGULAIR 10 MG tablet Take 10 mg by mouth at bedtime as needed.       . triamterene-hydrochlorothiazide (MAXZIDE-25) 37.5-25 MG per tablet Take 1 tablet by mouth  daily  90 tablet  1  . triamterene-hydrochlorothiazide (MAXZIDE-25) 37.5-25 MG per tablet TAKE 1 EACH (1 TABLET TOTAL) BY MOUTH DAILY.  90 tablet  3  . valsartan (DIOVAN) 160 MG tablet Take 1 tablet (160 mg total) by mouth  daily.  90 tablet  3  . vitamin D, CHOLECALCIFEROL, 400 UNITS tablet Take 400 Units by mouth 2 (two) times daily.         No current facility-administered medications on file prior to visit.    Review of Systems Review of Systems  Constitutional: Negative for fever, appetite change, fatigue and unexpected weight change.  Eyes: Negative for pain and visual disturbance.  Respiratory: Negative for cough and shortness of breath.   Cardiovascular: Negative for cp or palpitations    Gastrointestinal: Negative for nausea, diarrhea and constipation.  Genitourinary: Negative for urgency and frequency.  Skin: Negative for pallor or rash   MSK pos for R foot pain/ hammer toes  Neurological: Negative for weakness, light-headedness, numbness and headaches.  Hematological: Negative for adenopathy. Does not bruise/bleed easily.  Psychiatric/Behavioral: Negative for dysphoric mood. The patient is not nervous/anxious.         Objective:   Physical Exam  Constitutional: She appears well-developed and well-nourished. No distress.  HENT:  Head: Normocephalic and atraumatic.  Mouth/Throat: Oropharynx is clear and moist.  Eyes: Conjunctivae and EOM are normal. Pupils are equal, round, and reactive to light. Right eye exhibits no discharge. Left eye exhibits no discharge. No scleral icterus.  Neck: Normal range of motion. Neck supple. No JVD present. Carotid bruit is not present. No thyromegaly present.  Cardiovascular: Normal rate, regular rhythm, normal heart sounds and intact distal pulses.  Exam reveals no gallop.   Pulmonary/Chest: Effort normal and breath sounds normal. No respiratory distress. She has no wheezes. She has no rales.  Abdominal: Soft. Bowel sounds are normal. She exhibits no distension, no abdominal bruit and no mass. There is no tenderness.  Musculoskeletal: She exhibits no edema and no tenderness.  Hammer toes noted on R foot  Lymphadenopathy:    She has no cervical adenopathy.    Neurological: She is alert. She has normal reflexes. No cranial nerve deficit. She exhibits normal muscle tone. Coordination normal.  Skin: Skin is warm and dry. No rash noted. No erythema. No pallor.  Psychiatric: She has a normal mood and affect.          Assessment & Plan:

## 2013-07-21 NOTE — Patient Instructions (Signed)
No restrictions for upcoming surgery  You will likely have to stop your aspirin , meloxicam and fish oil for a certain time before your surgery Good luck with everything  Take care of yourself

## 2013-07-21 NOTE — Assessment & Plan Note (Signed)
No restrictions for upcoming foot surgery with Dr Elvina Mattes Will send in clearance form inst that she will have to hold her asa and meloxicam (she takes prn) and fish oil prior to procedure -likely for a week  EKG- NSR with rate of 71 -no concerns

## 2013-07-21 NOTE — Progress Notes (Signed)
Pre-visit discussion using our clinic review tool. No additional management support is needed unless otherwise documented below in the visit note.  

## 2013-07-22 DIAGNOSIS — J309 Allergic rhinitis, unspecified: Secondary | ICD-10-CM | POA: Diagnosis not present

## 2013-07-29 DIAGNOSIS — J309 Allergic rhinitis, unspecified: Secondary | ICD-10-CM | POA: Diagnosis not present

## 2013-08-05 DIAGNOSIS — J3089 Other allergic rhinitis: Secondary | ICD-10-CM | POA: Diagnosis not present

## 2013-08-05 DIAGNOSIS — J301 Allergic rhinitis due to pollen: Secondary | ICD-10-CM | POA: Diagnosis not present

## 2013-08-06 DIAGNOSIS — M898X9 Other specified disorders of bone, unspecified site: Secondary | ICD-10-CM | POA: Diagnosis not present

## 2013-08-06 DIAGNOSIS — M2040 Other hammer toe(s) (acquired), unspecified foot: Secondary | ICD-10-CM | POA: Diagnosis not present

## 2013-08-06 DIAGNOSIS — M19079 Primary osteoarthritis, unspecified ankle and foot: Secondary | ICD-10-CM | POA: Diagnosis not present

## 2013-08-13 DIAGNOSIS — H4010X0 Unspecified open-angle glaucoma, stage unspecified: Secondary | ICD-10-CM | POA: Diagnosis not present

## 2013-08-14 ENCOUNTER — Ambulatory Visit: Payer: Self-pay | Admitting: Podiatry

## 2013-08-14 DIAGNOSIS — K219 Gastro-esophageal reflux disease without esophagitis: Secondary | ICD-10-CM | POA: Diagnosis not present

## 2013-08-14 DIAGNOSIS — M898X9 Other specified disorders of bone, unspecified site: Secondary | ICD-10-CM | POA: Diagnosis not present

## 2013-08-14 DIAGNOSIS — M2040 Other hammer toe(s) (acquired), unspecified foot: Secondary | ICD-10-CM | POA: Diagnosis not present

## 2013-08-14 DIAGNOSIS — Z888 Allergy status to other drugs, medicaments and biological substances status: Secondary | ICD-10-CM | POA: Diagnosis not present

## 2013-08-14 DIAGNOSIS — Z79899 Other long term (current) drug therapy: Secondary | ICD-10-CM | POA: Diagnosis not present

## 2013-08-14 DIAGNOSIS — Z7982 Long term (current) use of aspirin: Secondary | ICD-10-CM | POA: Diagnosis not present

## 2013-08-14 DIAGNOSIS — H409 Unspecified glaucoma: Secondary | ICD-10-CM | POA: Diagnosis not present

## 2013-08-14 DIAGNOSIS — I10 Essential (primary) hypertension: Secondary | ICD-10-CM | POA: Diagnosis not present

## 2013-08-14 DIAGNOSIS — Z881 Allergy status to other antibiotic agents status: Secondary | ICD-10-CM | POA: Diagnosis not present

## 2013-08-14 DIAGNOSIS — M19079 Primary osteoarthritis, unspecified ankle and foot: Secondary | ICD-10-CM | POA: Diagnosis not present

## 2013-08-14 DIAGNOSIS — E876 Hypokalemia: Secondary | ICD-10-CM | POA: Diagnosis not present

## 2013-08-14 DIAGNOSIS — E785 Hyperlipidemia, unspecified: Secondary | ICD-10-CM | POA: Diagnosis not present

## 2013-08-15 NOTE — ED Provider Notes (Signed)
Order(s) created erroneously. Erroneous order ID: [id number] Order moved by: Tiney Rouge Order move date/time: 08/15/2013  4:13 PM Source Patient:    [id number] Source Contact: 07/21/2013 Destination Patient:   [id number] Destination Contact: 09/17/2012

## 2013-08-15 NOTE — ED Provider Notes (Signed)
Order(s) created erroneously. Erroneous order ID: [id number] Order moved by: Tiney Rouge Order move date/time: 08/15/2013  4:14 PM Source Patient:    [id number] Source Contact: 07/21/2013 Destination Patient:   [id number] Destination Contact: 09/17/2012

## 2013-08-22 DIAGNOSIS — J309 Allergic rhinitis, unspecified: Secondary | ICD-10-CM | POA: Diagnosis not present

## 2013-09-02 DIAGNOSIS — J309 Allergic rhinitis, unspecified: Secondary | ICD-10-CM | POA: Diagnosis not present

## 2013-09-09 DIAGNOSIS — J309 Allergic rhinitis, unspecified: Secondary | ICD-10-CM | POA: Diagnosis not present

## 2013-09-10 DIAGNOSIS — Z09 Encounter for follow-up examination after completed treatment for conditions other than malignant neoplasm: Secondary | ICD-10-CM | POA: Diagnosis not present

## 2013-09-16 DIAGNOSIS — J309 Allergic rhinitis, unspecified: Secondary | ICD-10-CM | POA: Diagnosis not present

## 2013-09-23 ENCOUNTER — Encounter: Payer: Self-pay | Admitting: *Deleted

## 2013-09-23 DIAGNOSIS — J309 Allergic rhinitis, unspecified: Secondary | ICD-10-CM | POA: Diagnosis not present

## 2013-09-29 DIAGNOSIS — J309 Allergic rhinitis, unspecified: Secondary | ICD-10-CM | POA: Diagnosis not present

## 2013-10-02 DIAGNOSIS — J309 Allergic rhinitis, unspecified: Secondary | ICD-10-CM | POA: Diagnosis not present

## 2013-10-07 DIAGNOSIS — J309 Allergic rhinitis, unspecified: Secondary | ICD-10-CM | POA: Diagnosis not present

## 2013-10-08 DIAGNOSIS — M659 Synovitis and tenosynovitis, unspecified: Secondary | ICD-10-CM | POA: Diagnosis not present

## 2013-10-14 DIAGNOSIS — J309 Allergic rhinitis, unspecified: Secondary | ICD-10-CM | POA: Diagnosis not present

## 2013-10-21 DIAGNOSIS — J309 Allergic rhinitis, unspecified: Secondary | ICD-10-CM | POA: Diagnosis not present

## 2013-10-28 DIAGNOSIS — J309 Allergic rhinitis, unspecified: Secondary | ICD-10-CM | POA: Diagnosis not present

## 2013-11-04 DIAGNOSIS — J309 Allergic rhinitis, unspecified: Secondary | ICD-10-CM | POA: Diagnosis not present

## 2013-11-11 DIAGNOSIS — J309 Allergic rhinitis, unspecified: Secondary | ICD-10-CM | POA: Diagnosis not present

## 2013-11-18 DIAGNOSIS — J309 Allergic rhinitis, unspecified: Secondary | ICD-10-CM | POA: Diagnosis not present

## 2013-12-09 DIAGNOSIS — J309 Allergic rhinitis, unspecified: Secondary | ICD-10-CM | POA: Diagnosis not present

## 2013-12-16 DIAGNOSIS — J309 Allergic rhinitis, unspecified: Secondary | ICD-10-CM | POA: Diagnosis not present

## 2013-12-23 DIAGNOSIS — J309 Allergic rhinitis, unspecified: Secondary | ICD-10-CM | POA: Diagnosis not present

## 2013-12-24 DIAGNOSIS — M2459 Contracture, other specified joint: Secondary | ICD-10-CM | POA: Diagnosis not present

## 2013-12-30 DIAGNOSIS — J309 Allergic rhinitis, unspecified: Secondary | ICD-10-CM | POA: Diagnosis not present

## 2014-01-08 DIAGNOSIS — J309 Allergic rhinitis, unspecified: Secondary | ICD-10-CM | POA: Diagnosis not present

## 2014-01-13 DIAGNOSIS — Z1151 Encounter for screening for human papillomavirus (HPV): Secondary | ICD-10-CM | POA: Diagnosis not present

## 2014-01-13 DIAGNOSIS — J309 Allergic rhinitis, unspecified: Secondary | ICD-10-CM | POA: Diagnosis not present

## 2014-01-13 DIAGNOSIS — Z1231 Encounter for screening mammogram for malignant neoplasm of breast: Secondary | ICD-10-CM | POA: Diagnosis not present

## 2014-01-13 DIAGNOSIS — Z1211 Encounter for screening for malignant neoplasm of colon: Secondary | ICD-10-CM | POA: Diagnosis not present

## 2014-01-13 DIAGNOSIS — M2459 Contracture, other specified joint: Secondary | ICD-10-CM | POA: Diagnosis not present

## 2014-01-13 DIAGNOSIS — Z01419 Encounter for gynecological examination (general) (routine) without abnormal findings: Secondary | ICD-10-CM | POA: Diagnosis not present

## 2014-01-20 DIAGNOSIS — J309 Allergic rhinitis, unspecified: Secondary | ICD-10-CM | POA: Diagnosis not present

## 2014-01-27 DIAGNOSIS — J309 Allergic rhinitis, unspecified: Secondary | ICD-10-CM | POA: Diagnosis not present

## 2014-02-05 DIAGNOSIS — J309 Allergic rhinitis, unspecified: Secondary | ICD-10-CM | POA: Diagnosis not present

## 2014-02-10 DIAGNOSIS — J309 Allergic rhinitis, unspecified: Secondary | ICD-10-CM | POA: Diagnosis not present

## 2014-02-13 DIAGNOSIS — H4010X0 Unspecified open-angle glaucoma, stage unspecified: Secondary | ICD-10-CM | POA: Diagnosis not present

## 2014-02-17 DIAGNOSIS — J309 Allergic rhinitis, unspecified: Secondary | ICD-10-CM | POA: Diagnosis not present

## 2014-02-18 ENCOUNTER — Encounter (INDEPENDENT_AMBULATORY_CARE_PROVIDER_SITE_OTHER): Payer: Self-pay

## 2014-02-18 ENCOUNTER — Encounter: Payer: Self-pay | Admitting: Family Medicine

## 2014-02-18 ENCOUNTER — Ambulatory Visit (INDEPENDENT_AMBULATORY_CARE_PROVIDER_SITE_OTHER): Payer: Medicare Other | Admitting: Family Medicine

## 2014-02-18 VITALS — BP 118/78 | HR 76 | Temp 98.5°F | Ht 62.0 in | Wt 158.5 lb

## 2014-02-18 DIAGNOSIS — R109 Unspecified abdominal pain: Secondary | ICD-10-CM | POA: Diagnosis not present

## 2014-02-18 DIAGNOSIS — R112 Nausea with vomiting, unspecified: Secondary | ICD-10-CM | POA: Diagnosis not present

## 2014-02-18 LAB — POCT URINALYSIS DIPSTICK
Bilirubin, UA: NEGATIVE
GLUCOSE UA: NEGATIVE
Ketones, UA: NEGATIVE
NITRITE UA: NEGATIVE
PROTEIN UA: NEGATIVE
RBC UA: NEGATIVE
SPEC GRAV UA: 1.01
Urobilinogen, UA: 0.2
pH, UA: 8

## 2014-02-18 NOTE — Assessment & Plan Note (Signed)
Severe last night with n/v/d Now better  Reassuring exam Strongly suspect she passed a kidney stone and was not aware ua today Disc imp of hydration  Will update if symptoms return

## 2014-02-18 NOTE — Progress Notes (Signed)
Pre visit review using our clinic review tool, if applicable. No additional management support is needed unless otherwise documented below in the visit note. 

## 2014-02-18 NOTE — Patient Instructions (Signed)
I think you may have passed a kidney stone  Keep heat on the sore flank area Leave a urine specimen on the way out -we will get back to you with a result  Drink lots of fluids  Gradually advance diet to crackers and bland food If symptoms worsen - let me know asap

## 2014-02-18 NOTE — Progress Notes (Signed)
Subjective:    Patient ID: Amy Herman, female    DOB: [birth year], 68 y.o.   MRN: [id number]  HPI Here after vomiting and diarrhea   Yesterday - ate a burger at [employer] and then dairy queen milk shake (they shared it) Felt very full after that  5 pm started having pain across L flank and under bra and then it traveled down to side of stomach  Then she bloated a lot  Then got nauseated -started vomiting / loose stool (not watery) but urgent The pain was quite severe - this alarmed her  She was up all night  About 6 am she vomited and last BM- and pain got a lot better  Feeling a lot better since then  Drinking ginger ale A bit queasy   Results for orders placed in visit on 02/18/14  POCT URINALYSIS DIPSTICK      Result Value Ref Range   Color, UA Light Yellow     Clarity, UA Clear     Glucose, UA neg.     Bilirubin, UA neg.     Ketones, UA neg.     Spec Grav, UA 1.010     Blood, UA neg.     pH, UA 8.0     Protein, UA neg.     Urobilinogen, UA 0.2     Nitrite, UA neg.     Leukocytes, UA Trace        Patient Active Problem List   Diagnosis Date Noted  . Pre-operative cardiovascular examination 07/21/2013  . GERD (gastroesophageal reflux disease) 06/18/2013  . Varicose veins of lower extremities with other complications 44/07/270  . Eustachian tube dysfunction 08/15/2011  . Lumbar disc disease 08/15/2011  . Joint pain 08/15/2011  . Osteopenia 01/31/2011  . Post-menopausal 01/31/2011  . Hypokalemia 01/31/2011  . HYPERLIPIDEMIA 03/24/2008  . ESSENTIAL HYPERTENSION, BENIGN 03/24/2008  . COLONIC POLYPS, HX OF 03/24/2008   Past Medical History  Diagnosis Date  . Allergic rhinitis   . Hypertension   . GERD (gastroesophageal reflux disease)   . Osteoarthritis of knee   . Hyperlipidemia   . Bronchitis     more frequent in the past  . Osteopenia   . Colon polyps     history of  . Glaucoma     suspected  . Heart murmur    Past Surgical History    Procedure Laterality Date  . Carpal tunnel release    . Bunionectomy      bilateral-PIN LEFT GREAT TOE  . Knee arthroscopy      right  . Tubal ligation      bilateral  . Vein surgery  2006    legs  . Functional endoscopic sinus surgery      05-04-2011   History  Substance Use Topics  . Smoking status: Never Smoker   . Smokeless tobacco: Never Used     Comment: used to have secondary smoke exposure from husband  . Alcohol Use: No   Family History  Problem Relation Age of Onset  . Cancer Mother     colon, lungs, liver  . Colon cancer Mother 46  . Cancer Father     prostate  . Heart disease Father     A-fib  . Stroke Father     TIA's  . Alcohol abuse Brother   . Cancer Brother     pancreatic, smoker  . Diabetes Brother   . Diabetes Brother   . Stomach cancer Neg Hx  Allergies  Allergen Reactions  . Cefuroxime Axetil     REACTION: bumps on tounge  . Clarithromycin Other (See Comments)    Burning sensation to stomach  . Levofloxacin   . Losartan     cough  . Rosuvastatin     REACTION: severe muscle pain   Current Outpatient Prescriptions on File Prior to Visit  Medication Sig Dispense Refill  . aspirin 81 MG EC tablet Take 81 mg by mouth daily.        . calcium citrate-vitamin D (CITRACAL+D) 315-200 MG-UNIT per tablet Take 1 tablet by mouth daily.        Marland Kitchen esomeprazole (NEXIUM) 40 MG capsule Take 1 capsule (40 mg total) by mouth daily before breakfast.  90 capsule  3  . fexofenadine (ALLEGRA) 60 MG tablet Take 60 mg by mouth daily.      . fluticasone (FLONASE) 50 MCG/ACT nasal spray Place 2 sprays into the nose daily. 2 sprays in each nostril daily  16 g  1  . Garlic (ODOR FREE GARLIC) 546 MG TABS Take 2 tablets by mouth 2 (two) times daily.       . meloxicam (MOBIC) 15 MG tablet Take 1 tablet (15 mg total) by mouth daily as needed for pain. With food  90 tablet  1  . potassium chloride (KLOR-CON 10) 10 MEQ tablet Take 2 tablets (20 mEq total) by mouth daily.   180 tablet  1  . rosuvastatin (CRESTOR) 5 MG tablet Take 1 tablet (5 mg total) by mouth once a week.  12 tablet  3  . SINGULAIR 10 MG tablet Take 10 mg by mouth at bedtime as needed.       . triamterene-hydrochlorothiazide (MAXZIDE-25) 37.5-25 MG per tablet Take 1 tablet by mouth  daily  90 tablet  1  . triamterene-hydrochlorothiazide (MAXZIDE-25) 37.5-25 MG per tablet TAKE 1 EACH (1 TABLET TOTAL) BY MOUTH DAILY.  90 tablet  3  . valsartan (DIOVAN) 160 MG tablet Take 1 tablet (160 mg total) by mouth daily.  90 tablet  3  . vitamin D, CHOLECALCIFEROL, 400 UNITS tablet Take 400 Units by mouth 2 (two) times daily.         No current facility-administered medications on file prior to visit.    Review of Systems Review of Systems  Constitutional: Negative for fever, appetite change,  and unexpected weight change.  Eyes: Negative for pain and visual disturbance.  Respiratory: Negative for cough and shortness of breath.   Cardiovascular: Negative for cp or palpitations    Gastrointestinal: Negative for constipation/blood in stool or dark stool.  Genitourinary: Negative for urgency and frequency. neg for hematuria / pos for L flank pain that moved to pelvic area  Skin: Negative for pallor or rash   Neurological: Negative for weakness, light-headedness, numbness and headaches.  Hematological: Negative for adenopathy. Does not bruise/bleed easily.  Psychiatric/Behavioral: Negative for dysphoric mood. The patient is not nervous/anxious.         Objective:   Physical Exam  Constitutional: She appears well-developed and well-nourished. No distress.  HENT:  Head: Normocephalic and atraumatic.  Mouth/Throat: Oropharynx is clear and moist.  Eyes: Conjunctivae and EOM are normal. Pupils are equal, round, and reactive to light. No scleral icterus.  Neck: Normal range of motion. Neck supple. No JVD present. No thyromegaly present.  Cardiovascular: Normal rate and regular rhythm.   Pulmonary/Chest:  Effort normal and breath sounds normal. No respiratory distress. She has no wheezes. She has no rales.  Abdominal: Soft. Bowel sounds are normal. She exhibits no distension and no mass. There is tenderness. There is no rebound and no guarding.  Very mild L CVA tenderness   Musculoskeletal: She exhibits no edema and no tenderness.  Lymphadenopathy:    She has no cervical adenopathy.  Neurological: She is alert. She has normal reflexes.  Skin: Skin is warm and dry. No rash noted. No erythema. No pallor.  Nl cap refill   Psychiatric: She has a normal mood and affect.          Assessment & Plan:   Problem List Items Addressed This Visit     Digestive   Nausea with vomiting     With L flank pain that abruptly improved and stopped this am  Suspect she has passed a kidney stone  ua today  Will drink fluids/gradually adv diet and update if symptoms return      Other   Left flank pain - Primary     Severe last night with n/v/d Now better  Reassuring exam Strongly suspect she passed a kidney stone and was not aware ua today Disc imp of hydration  Will update if symptoms return     Relevant Orders      POCT urinalysis dipstick (Completed)

## 2014-02-18 NOTE — Assessment & Plan Note (Signed)
With L flank pain that abruptly improved and stopped this am  Suspect she has passed a kidney stone  ua today  Will drink fluids/gradually adv diet and update if symptoms return

## 2014-02-19 ENCOUNTER — Telehealth: Payer: Self-pay

## 2014-02-19 NOTE — Telephone Encounter (Signed)
Pt left v/m requesting samples of Nexium; there was one savings card in the cabinet for Nexium.Please advise.pt request cb.

## 2014-02-23 ENCOUNTER — Telehealth: Payer: Self-pay | Admitting: *Deleted

## 2014-02-23 NOTE — Telephone Encounter (Signed)
Pt said that the crestor is to expensive and would like to switch to the generic lipitor, pt said insurance will pain for the generic lipitor, pt would like a 30 day supply [facility] to [facility] [address] and then the rest of the Rx sent to [facility] order because pt is almost out of crestor, please advise.  FYI: pt also wanted you to know that she thinks she did have a Kidney stone because she went to the bathroom after seeing [place] and almost instantly her pain was gone and she hasn't had anymore pain since

## 2014-02-23 NOTE — Telephone Encounter (Signed)
Pt notified we don't have samples but we have saving cards, card placed at front for pick-up

## 2014-02-23 NOTE — Telephone Encounter (Signed)
It does sound like she had a kidney stone for sure- I'm glad she is feeling better !   She takes crestor 5 mg once per week - correct? If so - we would dose generic lipitor (atorvastatin) at 20 mg once per week (it is less potent and this would be the equivalent dose)  I generally start with a local px to make sure the pt tolerates it before we go forth with mail order just to make sure   Let me know what she wants to do

## 2014-02-24 DIAGNOSIS — J309 Allergic rhinitis, unspecified: Secondary | ICD-10-CM | POA: Diagnosis not present

## 2014-02-24 MED ORDER — ATORVASTATIN CALCIUM 20 MG PO TABS: ORAL_TABLET | ORAL | Status: DC

## 2014-02-24 MED ORDER — ESOMEPRAZOLE MAGNESIUM 40 MG PO CPDR: 40.0000 mg | DELAYED_RELEASE_CAPSULE | Freq: Every day | ORAL | Status: DC

## 2014-02-24 NOTE — Telephone Encounter (Signed)
Local pharmacy is fine

## 2014-02-24 NOTE — Telephone Encounter (Signed)
appt scheduled

## 2014-02-24 NOTE — Telephone Encounter (Signed)
Please schedule lab for fasting lipid/ast/alt in about 6 weeks  I sent px to her pharmacy

## 2014-02-26 DIAGNOSIS — H4010X0 Unspecified open-angle glaucoma, stage unspecified: Secondary | ICD-10-CM | POA: Diagnosis not present

## 2014-03-03 DIAGNOSIS — J309 Allergic rhinitis, unspecified: Secondary | ICD-10-CM | POA: Diagnosis not present

## 2014-03-04 DIAGNOSIS — M19079 Primary osteoarthritis, unspecified ankle and foot: Secondary | ICD-10-CM | POA: Diagnosis not present

## 2014-03-04 DIAGNOSIS — M1710 Unilateral primary osteoarthritis, unspecified knee: Secondary | ICD-10-CM | POA: Diagnosis not present

## 2014-03-10 DIAGNOSIS — J309 Allergic rhinitis, unspecified: Secondary | ICD-10-CM | POA: Diagnosis not present

## 2014-03-11 DIAGNOSIS — M1710 Unilateral primary osteoarthritis, unspecified knee: Secondary | ICD-10-CM | POA: Diagnosis not present

## 2014-03-17 DIAGNOSIS — J309 Allergic rhinitis, unspecified: Secondary | ICD-10-CM | POA: Diagnosis not present

## 2014-03-18 DIAGNOSIS — M1710 Unilateral primary osteoarthritis, unspecified knee: Secondary | ICD-10-CM | POA: Diagnosis not present

## 2014-03-24 DIAGNOSIS — J309 Allergic rhinitis, unspecified: Secondary | ICD-10-CM | POA: Diagnosis not present

## 2014-03-26 ENCOUNTER — Other Ambulatory Visit: Payer: Self-pay | Admitting: Family Medicine

## 2014-03-26 ENCOUNTER — Telehealth: Payer: Self-pay | Admitting: Family Medicine

## 2014-03-26 DIAGNOSIS — E785 Hyperlipidemia, unspecified: Secondary | ICD-10-CM

## 2014-03-26 DIAGNOSIS — E7800 Pure hypercholesterolemia, unspecified: Secondary | ICD-10-CM

## 2014-03-26 DIAGNOSIS — I10 Essential (primary) hypertension: Secondary | ICD-10-CM

## 2014-03-26 DIAGNOSIS — E876 Hypokalemia: Secondary | ICD-10-CM

## 2014-03-26 NOTE — Telephone Encounter (Signed)
Patient is scheduled to have lab work done on 04/01/14.  Patient is asking that an order be mailed to her because she works for [employer] and can have the lab work done for free.

## 2014-03-27 DIAGNOSIS — J309 Allergic rhinitis, unspecified: Secondary | ICD-10-CM | POA: Diagnosis not present

## 2014-03-27 NOTE — Addendum Note (Signed)
Addended by: Loura Pardon A on: 03/27/2014 02:17 PM   Modules accepted: Orders

## 2014-03-27 NOTE — Telephone Encounter (Signed)
Lab orders

## 2014-03-27 NOTE — Telephone Encounter (Signed)
Orders printed and in IN box

## 2014-03-27 NOTE — Telephone Encounter (Signed)
Orders mailed and pt notified

## 2014-03-31 DIAGNOSIS — J309 Allergic rhinitis, unspecified: Secondary | ICD-10-CM | POA: Diagnosis not present

## 2014-04-01 ENCOUNTER — Other Ambulatory Visit: Payer: BLUE CROSS/BLUE SHIELD

## 2014-04-03 DIAGNOSIS — I10 Essential (primary) hypertension: Secondary | ICD-10-CM | POA: Diagnosis not present

## 2014-04-03 DIAGNOSIS — E785 Hyperlipidemia, unspecified: Secondary | ICD-10-CM | POA: Diagnosis not present

## 2014-04-04 LAB — LIPID PANEL
Chol/HDL Ratio: 3.6 ratio units (ref 0.0–4.4)
Cholesterol, Total: 222 mg/dL — ABNORMAL HIGH (ref 100–199)
HDL: 62 mg/dL (ref >39)
LDL CALC: 138 mg/dL — AB (ref 0–99)
TRIGLYCERIDES: 112 mg/dL (ref 0–149)
VLDL Cholesterol Cal: 22 mg/dL (ref 5–40)

## 2014-04-04 LAB — BASIC METABOLIC PANEL
BUN/Creatinine Ratio: 16 (ref 11–26)
BUN: 14 mg/dL (ref 8–27)
CHLORIDE: 102 mmol/L (ref 97–108)
CO2: 25 mmol/L (ref 18–29)
Calcium: 9.7 mg/dL (ref 8.7–10.3)
Creatinine, Ser: 0.9 mg/dL (ref 0.57–1.00)
GFR calc non Af Amer: 65 mL/min/{1.73_m2} (ref >59)
GFR, EST AFRICAN AMERICAN: 75 mL/min/{1.73_m2} (ref >59)
GLUCOSE: 89 mg/dL (ref 65–99)
POTASSIUM: 3.8 mmol/L (ref 3.5–5.2)
Sodium: 142 mmol/L (ref 134–144)

## 2014-04-07 ENCOUNTER — Encounter: Payer: Self-pay | Admitting: *Deleted

## 2014-04-07 DIAGNOSIS — J3089 Other allergic rhinitis: Secondary | ICD-10-CM | POA: Diagnosis not present

## 2014-04-14 DIAGNOSIS — J3089 Other allergic rhinitis: Secondary | ICD-10-CM | POA: Diagnosis not present

## 2014-04-23 DIAGNOSIS — J3089 Other allergic rhinitis: Secondary | ICD-10-CM | POA: Diagnosis not present

## 2014-04-28 DIAGNOSIS — J3089 Other allergic rhinitis: Secondary | ICD-10-CM | POA: Diagnosis not present

## 2014-05-05 DIAGNOSIS — J3089 Other allergic rhinitis: Secondary | ICD-10-CM | POA: Diagnosis not present

## 2014-05-07 ENCOUNTER — Other Ambulatory Visit: Payer: Self-pay | Admitting: *Deleted

## 2014-05-07 MED ORDER — ATORVASTATIN CALCIUM 20 MG PO TABS: ORAL_TABLET | ORAL | Status: DC

## 2014-05-07 NOTE — Telephone Encounter (Signed)
Fax refill request, no recent/future appt., please advise  

## 2014-05-07 NOTE — Telephone Encounter (Signed)
done

## 2014-05-07 NOTE — Telephone Encounter (Signed)
Please refill for 6 mo 

## 2014-05-14 DIAGNOSIS — J3089 Other allergic rhinitis: Secondary | ICD-10-CM | POA: Diagnosis not present

## 2014-05-19 DIAGNOSIS — J3089 Other allergic rhinitis: Secondary | ICD-10-CM | POA: Diagnosis not present

## 2014-05-26 ENCOUNTER — Ambulatory Visit: Payer: BLUE CROSS/BLUE SHIELD

## 2014-05-26 DIAGNOSIS — J3089 Other allergic rhinitis: Secondary | ICD-10-CM | POA: Diagnosis not present

## 2014-06-02 DIAGNOSIS — J3089 Other allergic rhinitis: Secondary | ICD-10-CM | POA: Diagnosis not present

## 2014-06-03 ENCOUNTER — Ambulatory Visit (INDEPENDENT_AMBULATORY_CARE_PROVIDER_SITE_OTHER): Payer: Medicare Other

## 2014-06-03 DIAGNOSIS — Z23 Encounter for immunization: Secondary | ICD-10-CM | POA: Diagnosis not present

## 2014-06-11 DIAGNOSIS — J3089 Other allergic rhinitis: Secondary | ICD-10-CM | POA: Diagnosis not present

## 2014-06-18 DIAGNOSIS — J3089 Other allergic rhinitis: Secondary | ICD-10-CM | POA: Diagnosis not present

## 2014-06-23 DIAGNOSIS — J3089 Other allergic rhinitis: Secondary | ICD-10-CM | POA: Diagnosis not present

## 2014-07-07 ENCOUNTER — Telehealth: Payer: Self-pay | Admitting: Family Medicine

## 2014-07-07 MED ORDER — VALSARTAN 160 MG PO TABS: 160.0000 mg | ORAL_TABLET | Freq: Every day | ORAL | Status: DC

## 2014-07-07 NOTE — Telephone Encounter (Signed)
Pt does mail order through [facility].  Pt will not get this rx in the mail until 1/18 so the patient needs a rx written to send to her local drugstore so that she will not run out of pills. [facility] [address] She needs this today since she just took her last pill this morning.

## 2014-07-07 NOTE — Telephone Encounter (Signed)
Please send enough to her local pharmacy to get her by - about 2 weeks Thanks

## 2014-07-07 NOTE — Telephone Encounter (Signed)
Rx sent and pt notified.

## 2014-07-09 DIAGNOSIS — J3089 Other allergic rhinitis: Secondary | ICD-10-CM | POA: Diagnosis not present

## 2014-07-15 ENCOUNTER — Ambulatory Visit (INDEPENDENT_AMBULATORY_CARE_PROVIDER_SITE_OTHER): Payer: Medicare Other | Admitting: Family Medicine

## 2014-07-15 ENCOUNTER — Encounter: Payer: Self-pay | Admitting: Family Medicine

## 2014-07-15 VITALS — BP 118/74 | HR 71 | Temp 98.4°F | Ht 62.0 in | Wt 157.0 lb

## 2014-07-15 DIAGNOSIS — J069 Acute upper respiratory infection, unspecified: Secondary | ICD-10-CM | POA: Diagnosis not present

## 2014-07-15 NOTE — Progress Notes (Signed)
Subjective:    Patient ID: Amy Herman, female    DOB: [birth year], 69 y.o.   MRN: [id number]  HPI Here for uri symptoms  Sinus issues   Started with some facial tenderness- on the L side of her face under the eye  Comes and goes  Started a few days ago  Ears feel full - sounds like she is in a barrel  Nasal congestion - with clear d/c Post nasal drip  Glands in neck feel swollen - worse on the R side / a little sore Non prod cough - last week  Doing sinus irrigations also -helpful  Using flonase   Symptoms are leveling out now   No fever   Patient Active Problem List   Diagnosis Date Noted  . Left flank pain 02/18/2014  . Nausea with vomiting 02/18/2014  . Pre-operative cardiovascular examination 07/21/2013  . GERD (gastroesophageal reflux disease) 06/18/2013  . Varicose veins of lower extremities with other complications 09/32/6712  . Eustachian tube dysfunction 08/15/2011  . Lumbar disc disease 08/15/2011  . Joint pain 08/15/2011  . Osteopenia 01/31/2011  . Post-menopausal 01/31/2011  . Hypokalemia 01/31/2011  . HYPERLIPIDEMIA 03/24/2008  . ESSENTIAL HYPERTENSION, BENIGN 03/24/2008  . COLONIC POLYPS, HX OF 03/24/2008   Past Medical History  Diagnosis Date  . Allergic rhinitis   . Hypertension   . GERD (gastroesophageal reflux disease)   . Osteoarthritis of knee   . Hyperlipidemia   . Bronchitis     more frequent in the past  . Osteopenia   . Colon polyps     history of  . Glaucoma     suspected  . Heart murmur    Past Surgical History  Procedure Laterality Date  . Carpal tunnel release    . Bunionectomy      bilateral-PIN LEFT GREAT TOE  . Knee arthroscopy      right  . Tubal ligation      bilateral  . Vein surgery  2006    legs  . Functional endoscopic sinus surgery      05-04-2011   History  Substance Use Topics  . Smoking status: Never Smoker   . Smokeless tobacco: Never Used     Comment: used to have secondary smoke exposure from  husband  . Alcohol Use: No   Family History  Problem Relation Age of Onset  . Cancer Mother     colon, lungs, liver  . Colon cancer Mother 57  . Cancer Father     prostate  . Heart disease Father     A-fib  . Stroke Father     TIA's  . Alcohol abuse Brother   . Cancer Brother     pancreatic, smoker  . Diabetes Brother   . Diabetes Brother   . Stomach cancer Neg Hx    Allergies  Allergen Reactions  . Cefuroxime Axetil     REACTION: bumps on tounge  . Clarithromycin Other (See Comments)    Burning sensation to stomach  . Levofloxacin   . Losartan     cough  . Rosuvastatin     REACTION: severe muscle pain   Current Outpatient Prescriptions on File Prior to Visit  Medication Sig Dispense Refill  . aspirin 81 MG EC tablet Take 81 mg by mouth daily.      Marland Kitchen atorvastatin (LIPITOR) 20 MG tablet Take one pill by mouth twice weekly 8 tablet 5  . calcium citrate-vitamin D (CITRACAL+D) 315-200 MG-UNIT per tablet Take 1  tablet by mouth daily.      Marland Kitchen esomeprazole (NEXIUM) 40 MG capsule Take 1 capsule (40 mg total) by mouth daily before breakfast. 90 capsule 1  . fexofenadine (ALLEGRA) 60 MG tablet Take 60 mg by mouth daily.    . fluticasone (FLONASE) 50 MCG/ACT nasal spray Place 2 sprays into the nose daily. 2 sprays in each nostril daily 16 g 1  . Garlic (ODOR FREE GARLIC) 165 MG TABS Take 2 tablets by mouth 2 (two) times daily.     . potassium chloride (KLOR-CON 10) 10 MEQ tablet Take 2 tablets (20 mEq total) by mouth daily. 180 tablet 1  . SINGULAIR 10 MG tablet Take 10 mg by mouth at bedtime as needed.     . triamterene-hydrochlorothiazide (MAXZIDE-25) 37.5-25 MG per tablet Take 1 tablet by mouth  daily 90 tablet 1  . triamterene-hydrochlorothiazide (MAXZIDE-25) 37.5-25 MG per tablet TAKE 1 EACH (1 TABLET TOTAL) BY MOUTH DAILY. 90 tablet 3  . valsartan (DIOVAN) 160 MG tablet Take 1 tablet by mouth  daily 90 tablet 1  . valsartan (DIOVAN) 160 MG tablet Take 1 tablet (160 mg total)  by mouth daily. 15 tablet 0  . vitamin D, CHOLECALCIFEROL, 400 UNITS tablet Take 400 Units by mouth 2 (two) times daily.       No current facility-administered medications on file prior to visit.      Review of Systems Review of Systems  Constitutional: Negative for fever, appetite change,  and unexpected weight change.  ENt pos for congestion and sinus/ear pressure/neg for st  Eyes: Negative for pain and visual disturbance.  Respiratory: Negative for wheeze and shortness of breath.   Cardiovascular: Negative for cp or palpitations    Gastrointestinal: Negative for nausea, diarrhea and constipation.  Genitourinary: Negative for urgency and frequency.  Skin: Negative for pallor or rash   Neurological: Negative for weakness, light-headedness, numbness and headaches.  Hematological: Negative for adenopathy. Does not bruise/bleed easily.  Psychiatric/Behavioral: Negative for dysphoric mood. The patient is not nervous/anxious.         Objective:   Physical Exam  Constitutional: She appears well-developed and well-nourished. No distress.  HENT:  Head: Normocephalic and atraumatic.  Right Ear: External ear normal.  Left Ear: External ear normal.  Mouth/Throat: Oropharynx is clear and moist. No oropharyngeal exudate.  TMs are clear but retracted Nares are injected and congested  No sinus tenderness  Throat clear Some clear rhinorrhea   No facial rash or swelling   Eyes: Conjunctivae and EOM are normal. Pupils are equal, round, and reactive to light. Right eye exhibits no discharge. Left eye exhibits no discharge. No scleral icterus.  Neck: Normal range of motion. Neck supple. Carotid bruit is not present.  Cardiovascular: Normal rate, regular rhythm, normal heart sounds and intact distal pulses.  Exam reveals no gallop.   No murmur heard. Pulmonary/Chest: Effort normal and breath sounds normal. No respiratory distress. She has no wheezes. She has no rales.  Lymphadenopathy:     She has no cervical adenopathy.  Neurological: She is alert. No cranial nerve deficit.  Skin: Skin is warm and dry. No rash noted. No erythema.  Psychiatric: She has a normal mood and affect.          Assessment & Plan:   Problem List Items Addressed This Visit      Respiratory   Viral URI - Primary    Re assuring exam - with congestion/ nasal d/c Reviewed s/s of bacterial sinus infection to  watch for  Disc symptomatic care - see instructions on AVS -continue nasal irrigations  flonase should help ETD Update if not starting to improve in a week or if worsening

## 2014-07-15 NOTE — Progress Notes (Signed)
Pre visit review using our clinic review tool, if applicable. No additional management support is needed unless otherwise documented below in the visit note. 

## 2014-07-15 NOTE — Patient Instructions (Signed)
I think you have a viral upper respiratory infection with sinus congestion - but not a bacterial infection Keep doing sinus irrigations and flonase and rest  mucinex is ok if helpful  Watch for sinus pain/ colored nasal discharge and fever - these could be signs of a bacterial infection  Also if worse tingling or any rash let me know

## 2014-07-15 NOTE — Assessment & Plan Note (Signed)
Re assuring exam - with congestion/ nasal d/c Reviewed s/s of bacterial sinus infection to watch for  Disc symptomatic care - see instructions on AVS -continue nasal irrigations  flonase should help ETD Update if not starting to improve in a week or if worsening

## 2014-07-29 ENCOUNTER — Telehealth: Payer: Self-pay

## 2014-07-29 MED ORDER — AMOXICILLIN-POT CLAVULANATE 875-125 MG PO TABS: 1.0000 | ORAL_TABLET | Freq: Two times a day (BID) | ORAL | Status: AC

## 2014-07-29 NOTE — Telephone Encounter (Signed)
That is fine - I see she has med allergies  Has she been able to take augmentin or amoxicillin in the past ? If so we will go with augmentin 875 mg 1 po bid for 10 d # 20 no ref (please call in ) If not-let me know

## 2014-07-29 NOTE — Telephone Encounter (Signed)
Per pt she hasn't had any problems taking amox or augmentin in the past. I sent rx in electronically. Pt will call back if no improvement.

## 2014-07-29 NOTE — Telephone Encounter (Signed)
Pt was seen 07/15/14 and has seen no improvement in sinus pain; now pt has yellow mucus when blows nose and slight prod cough with yellow phlegm. No fever, no wheezing or SOB. Pt wants to know if should have abx sent to [place]. Pt request cb.

## 2014-08-06 DIAGNOSIS — J301 Allergic rhinitis due to pollen: Secondary | ICD-10-CM | POA: Diagnosis not present

## 2014-08-06 DIAGNOSIS — J3089 Other allergic rhinitis: Secondary | ICD-10-CM | POA: Diagnosis not present

## 2014-08-07 ENCOUNTER — Other Ambulatory Visit: Payer: Self-pay | Admitting: Family Medicine

## 2014-08-13 DIAGNOSIS — J3089 Other allergic rhinitis: Secondary | ICD-10-CM | POA: Diagnosis not present

## 2014-08-18 DIAGNOSIS — J3089 Other allergic rhinitis: Secondary | ICD-10-CM | POA: Diagnosis not present

## 2014-08-20 DIAGNOSIS — J3089 Other allergic rhinitis: Secondary | ICD-10-CM | POA: Diagnosis not present

## 2014-08-25 DIAGNOSIS — J3089 Other allergic rhinitis: Secondary | ICD-10-CM | POA: Diagnosis not present

## 2014-08-27 DIAGNOSIS — H4011X1 Primary open-angle glaucoma, mild stage: Secondary | ICD-10-CM | POA: Diagnosis not present

## 2014-09-01 DIAGNOSIS — J3089 Other allergic rhinitis: Secondary | ICD-10-CM | POA: Diagnosis not present

## 2014-09-01 LAB — HM MAMMOGRAPHY

## 2014-09-02 ENCOUNTER — Telehealth: Payer: Self-pay

## 2014-09-02 NOTE — Telephone Encounter (Signed)
Pt is considering starting CO Q 10 and pt wants Dr Glori Bickers to verify it is OK for pt to take with her other meds. Pt request cb.

## 2014-09-02 NOTE — Telephone Encounter (Signed)
I do not think that would be problematic but if any issues or side effects let me know

## 2014-09-02 NOTE — Telephone Encounter (Signed)
Called and notified patient of Dr Marliss Coots comments. Patient verbalized understanding.

## 2014-09-08 DIAGNOSIS — J3089 Other allergic rhinitis: Secondary | ICD-10-CM | POA: Diagnosis not present

## 2014-09-15 DIAGNOSIS — J3089 Other allergic rhinitis: Secondary | ICD-10-CM | POA: Diagnosis not present

## 2014-09-22 DIAGNOSIS — J3089 Other allergic rhinitis: Secondary | ICD-10-CM | POA: Diagnosis not present

## 2014-09-29 DIAGNOSIS — J3089 Other allergic rhinitis: Secondary | ICD-10-CM | POA: Diagnosis not present

## 2014-10-05 DIAGNOSIS — I8393 Asymptomatic varicose veins of bilateral lower extremities: Secondary | ICD-10-CM | POA: Diagnosis not present

## 2014-10-06 DIAGNOSIS — J3089 Other allergic rhinitis: Secondary | ICD-10-CM | POA: Diagnosis not present

## 2014-10-07 ENCOUNTER — Other Ambulatory Visit: Payer: Self-pay | Admitting: Family Medicine

## 2014-10-08 DIAGNOSIS — I87323 Chronic venous hypertension (idiopathic) with inflammation of bilateral lower extremity: Secondary | ICD-10-CM | POA: Diagnosis not present

## 2014-10-13 DIAGNOSIS — J3089 Other allergic rhinitis: Secondary | ICD-10-CM | POA: Diagnosis not present

## 2014-10-14 DIAGNOSIS — J3089 Other allergic rhinitis: Secondary | ICD-10-CM | POA: Diagnosis not present

## 2014-10-20 DIAGNOSIS — J3089 Other allergic rhinitis: Secondary | ICD-10-CM | POA: Diagnosis not present

## 2014-10-27 DIAGNOSIS — J3089 Other allergic rhinitis: Secondary | ICD-10-CM | POA: Diagnosis not present

## 2014-11-05 DIAGNOSIS — J3089 Other allergic rhinitis: Secondary | ICD-10-CM | POA: Diagnosis not present

## 2014-11-05 DIAGNOSIS — J301 Allergic rhinitis due to pollen: Secondary | ICD-10-CM | POA: Diagnosis not present

## 2014-12-03 DIAGNOSIS — J3089 Other allergic rhinitis: Secondary | ICD-10-CM | POA: Diagnosis not present

## 2014-12-10 DIAGNOSIS — J3089 Other allergic rhinitis: Secondary | ICD-10-CM | POA: Diagnosis not present

## 2014-12-15 DIAGNOSIS — J301 Allergic rhinitis due to pollen: Secondary | ICD-10-CM | POA: Diagnosis not present

## 2014-12-15 DIAGNOSIS — J3089 Other allergic rhinitis: Secondary | ICD-10-CM | POA: Diagnosis not present

## 2014-12-21 DIAGNOSIS — L816 Other disorders of diminished melanin formation: Secondary | ICD-10-CM | POA: Diagnosis not present

## 2014-12-21 DIAGNOSIS — L570 Actinic keratosis: Secondary | ICD-10-CM | POA: Diagnosis not present

## 2014-12-21 DIAGNOSIS — D229 Melanocytic nevi, unspecified: Secondary | ICD-10-CM | POA: Diagnosis not present

## 2014-12-21 DIAGNOSIS — L821 Other seborrheic keratosis: Secondary | ICD-10-CM | POA: Diagnosis not present

## 2014-12-21 DIAGNOSIS — D485 Neoplasm of uncertain behavior of skin: Secondary | ICD-10-CM | POA: Diagnosis not present

## 2014-12-22 ENCOUNTER — Encounter: Payer: Self-pay | Admitting: *Deleted

## 2014-12-25 ENCOUNTER — Other Ambulatory Visit: Payer: Self-pay | Admitting: Family Medicine

## 2014-12-25 DIAGNOSIS — J301 Allergic rhinitis due to pollen: Secondary | ICD-10-CM | POA: Diagnosis not present

## 2014-12-25 DIAGNOSIS — J3089 Other allergic rhinitis: Secondary | ICD-10-CM | POA: Diagnosis not present

## 2014-12-29 DIAGNOSIS — J3089 Other allergic rhinitis: Secondary | ICD-10-CM | POA: Diagnosis not present

## 2015-01-05 DIAGNOSIS — J3089 Other allergic rhinitis: Secondary | ICD-10-CM | POA: Diagnosis not present

## 2015-01-12 DIAGNOSIS — J3089 Other allergic rhinitis: Secondary | ICD-10-CM | POA: Diagnosis not present

## 2015-01-19 DIAGNOSIS — Z1231 Encounter for screening mammogram for malignant neoplasm of breast: Secondary | ICD-10-CM | POA: Diagnosis not present

## 2015-01-19 DIAGNOSIS — Z01419 Encounter for gynecological examination (general) (routine) without abnormal findings: Secondary | ICD-10-CM | POA: Diagnosis not present

## 2015-01-20 DIAGNOSIS — Z01419 Encounter for gynecological examination (general) (routine) without abnormal findings: Secondary | ICD-10-CM | POA: Diagnosis not present

## 2015-01-22 DIAGNOSIS — J3089 Other allergic rhinitis: Secondary | ICD-10-CM | POA: Diagnosis not present

## 2015-01-26 DIAGNOSIS — J3089 Other allergic rhinitis: Secondary | ICD-10-CM | POA: Diagnosis not present

## 2015-02-04 DIAGNOSIS — J3089 Other allergic rhinitis: Secondary | ICD-10-CM | POA: Diagnosis not present

## 2015-02-09 DIAGNOSIS — J3089 Other allergic rhinitis: Secondary | ICD-10-CM | POA: Diagnosis not present

## 2015-02-16 DIAGNOSIS — J3089 Other allergic rhinitis: Secondary | ICD-10-CM | POA: Diagnosis not present

## 2015-02-23 DIAGNOSIS — J3089 Other allergic rhinitis: Secondary | ICD-10-CM | POA: Diagnosis not present

## 2015-03-02 DIAGNOSIS — J3089 Other allergic rhinitis: Secondary | ICD-10-CM | POA: Diagnosis not present

## 2015-03-05 DIAGNOSIS — H4011X1 Primary open-angle glaucoma, mild stage: Secondary | ICD-10-CM | POA: Diagnosis not present

## 2015-03-11 DIAGNOSIS — J3089 Other allergic rhinitis: Secondary | ICD-10-CM | POA: Diagnosis not present

## 2015-03-15 ENCOUNTER — Ambulatory Visit: Payer: BLUE CROSS/BLUE SHIELD | Admitting: Family Medicine

## 2015-03-16 DIAGNOSIS — J3089 Other allergic rhinitis: Secondary | ICD-10-CM | POA: Diagnosis not present

## 2015-03-17 ENCOUNTER — Ambulatory Visit (INDEPENDENT_AMBULATORY_CARE_PROVIDER_SITE_OTHER): Payer: Medicare Other | Admitting: Family Medicine

## 2015-03-17 ENCOUNTER — Encounter: Payer: Self-pay | Admitting: Family Medicine

## 2015-03-17 VITALS — BP 128/68 | HR 77 | Temp 98.1°F | Ht 67.0 in | Wt 153.2 lb

## 2015-03-17 DIAGNOSIS — S0990XA Unspecified injury of head, initial encounter: Secondary | ICD-10-CM

## 2015-03-17 DIAGNOSIS — R51 Headache: Secondary | ICD-10-CM | POA: Diagnosis not present

## 2015-03-17 DIAGNOSIS — R519 Headache, unspecified: Secondary | ICD-10-CM

## 2015-03-17 NOTE — Patient Instructions (Signed)
Use cold compress on area of head pain and warm on your neck Try to get a more supportive pillow  Stop at check out for referral for CT -we will update you  If pain suddenly worsens or other symptoms let me know

## 2015-03-17 NOTE — Assessment & Plan Note (Signed)
Post traumatic (however trauma was in July) Happened in July (trip and fall)-contusion of R side of head behind ear Now starting to have pain in area  CT of head scheduled [facility] neuro exam  Will update in the meantime if worse or new symptoms

## 2015-03-17 NOTE — Assessment & Plan Note (Signed)
Happened in July (trip and fall)-contusion of R side of head behind ear Now starting to have pain in area  CT of head scheduled [facility] neuro exam  Will update in the meantime if worse or new symptoms

## 2015-03-17 NOTE — Progress Notes (Signed)
Subjective:    Patient ID: Amy Herman, female    DOB: [birth year], 69 y.o.   MRN: [id number]  HPI Here for headache   She had a fall in July- fell over a watermelon on the floor- fell and hit her head behind R ear on a counter Never really had a lump  Did not break skin or bleed   Hurt for a little while and did well   Then woke up late aug with pain in the area of injury  It radiates back to her neck and shoulders on that side  Ache (does not throb) is dull / not severe   She takes 81 mg asa daily   No blurred vision  No dizziness No nausea  No confusion or concentration problems   The pain goes away usually after a few areas   She took tylenol for it and also left over meloxicam (that would help)    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 Viral URI 07/15/2014  . Left flank pain 02/18/2014  . Nausea with vomiting 02/18/2014  . Pre-operative cardiovascular examination 07/21/2013  . GERD (gastroesophageal reflux disease) 06/18/2013  . Varicose veins of lower extremities with other complications 51/70/0174  . Eustachian tube dysfunction 08/15/2011  . Lumbar disc disease 08/15/2011  . Joint pain 08/15/2011  . Osteopenia 01/31/2011  . Post-menopausal 01/31/2011  . Hypokalemia 01/31/2011  . HYPERLIPIDEMIA 03/24/2008  . ESSENTIAL HYPERTENSION, BENIGN 03/24/2008  . COLONIC POLYPS, HX OF 03/24/2008   Past Medical History  Diagnosis Date  . Allergic rhinitis   . Hypertension   . GERD (gastroesophageal reflux disease)   . Osteoarthritis of knee   . Hyperlipidemia   . Bronchitis     more frequent in the past  . Osteopenia   . Colon polyps     history of  . Glaucoma     suspected  . Heart murmur    Past Surgical History  Procedure Laterality Date  . Carpal tunnel release    . Bunionectomy      bilateral-PIN LEFT GREAT TOE  . Knee arthroscopy      right  . Tubal ligation       bilateral  . Vein surgery  2006    legs  . Functional endoscopic sinus surgery      05-04-2011   Social History  Substance Use Topics  . Smoking status: Never Smoker   . Smokeless tobacco: Never Used     Comment: used to have secondary smoke exposure from husband  . Alcohol Use: No   Family History  Problem Relation Age of Onset  . Cancer Mother     colon, lungs, liver  . Colon cancer Mother 8  . Cancer Father     prostate  . Heart disease Father     A-fib  . Stroke Father     TIA's  . Alcohol abuse Brother   . Cancer Brother     pancreatic, smoker  . Diabetes Brother   . Diabetes Brother   . Stomach cancer Neg Hx    Allergies  Allergen Reactions  . Cefuroxime Axetil     REACTION: bumps on tounge  . Clarithromycin Other (See Comments)    Burning sensation to stomach  . Levofloxacin   . Losartan     cough  . Rosuvastatin     REACTION: severe muscle  pain   Current Outpatient Prescriptions on File Prior to Visit  Medication Sig Dispense Refill  . aspirin 81 MG EC tablet Take 81 mg by mouth daily.      Marland Kitchen atorvastatin (LIPITOR) 20 MG tablet Take one pill by mouth twice weekly 8 tablet 5  . calcium citrate-vitamin D (CITRACAL+D) 315-200 MG-UNIT per tablet Take 1 tablet by mouth daily.      Marland Kitchen esomeprazole (NEXIUM) 40 MG capsule Take 1 capsule (40 mg total) by mouth daily before breakfast. 90 capsule 1  . fluticasone (FLONASE) 50 MCG/ACT nasal spray Place 2 sprays into the nose daily. 2 sprays in each nostril daily 16 g 1  . Garlic (ODOR FREE GARLIC) 694 MG TABS Take 2 tablets by mouth 2 (two) times daily.     . potassium chloride (K-DUR) 10 MEQ tablet Take 2 tablets (20 mEq  total) by mouth daily. 180 tablet 3  . SINGULAIR 10 MG tablet Take 10 mg by mouth at bedtime as needed.     . triamterene-hydrochlorothiazide (MAXZIDE-25) 37.5-25 MG per tablet Take 1 tablet by mouth  daily 90 tablet 3  . valsartan (DIOVAN) 160 MG tablet Take 1 tablet by mouth  daily 90 tablet 0   . vitamin D, CHOLECALCIFEROL, 400 UNITS tablet Take 400 Units by mouth 2 (two) times daily.       No current facility-administered medications on file prior to visit.     Review of Systems Review of Systems  Constitutional: Negative for fever, appetite change, fatigue and unexpected weight change.  Eyes: Negative for pain and visual disturbance.  Respiratory: Negative for cough and shortness of breath.   Cardiovascular: Negative for cp or palpitations    Gastrointestinal: Negative for nausea, diarrhea and constipation.  Genitourinary: Negative for urgency and frequency.  Skin: Negative for pallor or rash   Neurological: Negative for weakness, light-headedness, numbness and pos for headache (and remote trauma to head) Hematological: Negative for adenopathy. Does not bruise/bleed easily.  Psychiatric/Behavioral: Negative for dysphoric mood. The patient is not nervous/anxious.         Objective:   Physical Exam  Constitutional: She is oriented to person, place, and time. She appears well-developed and well-nourished. No distress.  HENT:  Head: Normocephalic and atraumatic.  Right Ear: External ear normal.  Left Ear: External ear normal.  Nose: Nose normal.  Mouth/Throat: Oropharynx is clear and moist. No oropharyngeal exudate.  No sinus tenderness No temporal tenderness  No TMJ tenderness  No visible/palpable contusion or lump noted   Eyes: Conjunctivae and EOM are normal. Pupils are equal, round, and reactive to light. Right eye exhibits no discharge. Left eye exhibits no discharge. No scleral icterus.  No nystagmus  Neck: Normal range of motion and full passive range of motion without pain. Neck supple. No JVD present. Carotid bruit is not present. No tracheal deviation present. No thyromegaly present.  Cardiovascular: Normal rate, regular rhythm and normal heart sounds.   No murmur heard. Pulmonary/Chest: Effort normal and breath sounds normal. No respiratory distress. She  has no wheezes. She has no rales.  Abdominal: Soft. Bowel sounds are normal. She exhibits no distension and no mass. There is no tenderness.  Musculoskeletal: She exhibits no edema or tenderness.  Lymphadenopathy:    She has no cervical adenopathy.  Neurological: She is alert and oriented to person, place, and time. She has normal strength and normal reflexes. She displays no atrophy and no tremor. No cranial nerve deficit or sensory deficit. She exhibits normal  muscle tone. She displays a negative Romberg sign. Coordination and gait normal.  No focal cerebellar signs   Skin: Skin is warm and dry. No rash noted. No pallor.  Psychiatric: She has a normal mood and affect. Her behavior is normal. Thought content normal.          Assessment & Plan:   Problem List Items Addressed This Visit      Other   Head injury, closed    Happened in July (trip and fall)-contusion of R side of head behind ear Now starting to have pain in area  CT of head scheduled N[facility] neuro exam  Will update in the meantime if worse or new symptoms       Relevant Orders   CT Head Wo Contrast   Head pain - Primary    Post traumatic (however trauma was in July) Happened in July (trip and fall)-contusion of R side of head behind ear Now starting to have pain in area  CT of head scheduled Nl neuro exam  Will update in the meantime if worse or new symptoms        Relevant Orders   CT Head Wo Contrast

## 2015-03-17 NOTE — Progress Notes (Signed)
Pre visit review using our clinic review tool, if applicable. No additional management support is needed unless otherwise documented below in the visit note. 

## 2015-03-19 ENCOUNTER — Ambulatory Visit
Admission: RE | Admit: 2015-03-19 | Discharge: 2015-03-19 | Disposition: A | Discharge status: Home / Self Care | Payer: Medicare Other | Source: Ambulatory Visit | Attending: Family Medicine | Admitting: Family Medicine

## 2015-03-19 DIAGNOSIS — R51 Headache: Secondary | ICD-10-CM | POA: Insufficient documentation

## 2015-03-19 DIAGNOSIS — R519 Headache, unspecified: Secondary | ICD-10-CM

## 2015-03-19 DIAGNOSIS — S0990XA Unspecified injury of head, initial encounter: Secondary | ICD-10-CM

## 2015-03-22 ENCOUNTER — Telehealth: Payer: Self-pay | Admitting: Family Medicine

## 2015-03-22 NOTE — Telephone Encounter (Signed)
Mrs. Breshears returned your phone call while you were at lunch regarding the CT scan. Thank you.  CB Number [phone]

## 2015-03-22 NOTE — Telephone Encounter (Signed)
Addressed through result notes  

## 2015-03-25 DIAGNOSIS — H4011X1 Primary open-angle glaucoma, mild stage: Secondary | ICD-10-CM | POA: Diagnosis not present

## 2015-03-26 DIAGNOSIS — J3089 Other allergic rhinitis: Secondary | ICD-10-CM | POA: Diagnosis not present

## 2015-03-30 ENCOUNTER — Telehealth: Payer: Self-pay | Admitting: *Deleted

## 2015-03-30 DIAGNOSIS — R519 Headache, unspecified: Secondary | ICD-10-CM

## 2015-03-30 DIAGNOSIS — R51 Headache: Principal | ICD-10-CM

## 2015-03-30 NOTE — Telephone Encounter (Signed)
Patient left voice mail on triage line.  She did not indicate reason for call.  Left message for patient to return call.

## 2015-03-30 NOTE — Telephone Encounter (Signed)
Patient called to update on closed head injury and head pain from fall in July.  Patient was evaluated in the office 03/17/15.  She still c/o right sided head pain that comes during the night and improves as she is up and moving during the day.  No new symptoms.  Patient is requesting a neurology referral.  Her daughter recommended Dr. Alverda Skeans at [facility].  Please advise.

## 2015-03-30 NOTE — Telephone Encounter (Signed)
I will do that referral

## 2015-04-01 DIAGNOSIS — J3089 Other allergic rhinitis: Secondary | ICD-10-CM | POA: Diagnosis not present

## 2015-04-05 ENCOUNTER — Telehealth: Payer: Self-pay | Admitting: Family Medicine

## 2015-04-05 DIAGNOSIS — R519 Headache, unspecified: Secondary | ICD-10-CM

## 2015-04-05 DIAGNOSIS — R51 Headache: Principal | ICD-10-CM

## 2015-04-05 NOTE — Addendum Note (Signed)
Addended by: Loura Pardon A on: 04/05/2015 02:50 PM   Modules accepted: Orders

## 2015-04-05 NOTE — Telephone Encounter (Signed)
Ref done  

## 2015-04-05 NOTE — Telephone Encounter (Signed)
Patient was referred to [facility].  Patient can't get an appointment until February.  Patient would like to have a new referral sent to [facility], so she can be seen sooner.  Patient has been having headaches on and off.

## 2015-04-08 DIAGNOSIS — J3089 Other allergic rhinitis: Secondary | ICD-10-CM | POA: Diagnosis not present

## 2015-04-16 DIAGNOSIS — J3089 Other allergic rhinitis: Secondary | ICD-10-CM | POA: Diagnosis not present

## 2015-04-23 DIAGNOSIS — J3089 Other allergic rhinitis: Secondary | ICD-10-CM | POA: Diagnosis not present

## 2015-04-24 ENCOUNTER — Other Ambulatory Visit: Payer: Self-pay | Admitting: Family Medicine

## 2015-04-26 NOTE — Telephone Encounter (Signed)
Electronic refill request, med not on med list and last time it was prescribed was 03/05/13, please advise

## 2015-04-26 NOTE — Telephone Encounter (Signed)
Please check in with her -ask what she is taking it for-remind her to take with food and make sure she is not on any other nsaids Then can refill for a year

## 2015-04-26 NOTE — Telephone Encounter (Signed)
Pt is taking med for her head pain, pt sees neuro next month about pain, pt notified to take Rx with food and not to take any other nsaids, per Dr. Glori Bickers it's okay to take her asa 81mg  and tylenol

## 2015-04-27 DIAGNOSIS — J3089 Other allergic rhinitis: Secondary | ICD-10-CM | POA: Diagnosis not present

## 2015-05-03 DIAGNOSIS — J3089 Other allergic rhinitis: Secondary | ICD-10-CM | POA: Diagnosis not present

## 2015-05-10 ENCOUNTER — Other Ambulatory Visit: Payer: Self-pay | Admitting: Family Medicine

## 2015-05-13 ENCOUNTER — Encounter: Payer: Self-pay | Admitting: Neurology

## 2015-05-13 ENCOUNTER — Ambulatory Visit (INDEPENDENT_AMBULATORY_CARE_PROVIDER_SITE_OTHER): Payer: Medicare Other | Admitting: Neurology

## 2015-05-13 VITALS — BP 110/70 | HR 92 | Ht 67.0 in | Wt 152.4 lb

## 2015-05-13 DIAGNOSIS — M542 Cervicalgia: Secondary | ICD-10-CM

## 2015-05-13 DIAGNOSIS — M4806 Spinal stenosis, lumbar region: Secondary | ICD-10-CM | POA: Diagnosis not present

## 2015-05-13 DIAGNOSIS — G44309 Post-traumatic headache, unspecified, not intractable: Secondary | ICD-10-CM | POA: Diagnosis not present

## 2015-05-13 DIAGNOSIS — M48061 Spinal stenosis, lumbar region without neurogenic claudication: Secondary | ICD-10-CM

## 2015-05-13 MED ORDER — GABAPENTIN 100 MG PO CAPS: 100.0000 mg | ORAL_CAPSULE | Freq: Every day | ORAL | Status: DC

## 2015-05-13 NOTE — Patient Instructions (Signed)
1.  For headache, we will send you for physical therapy on the neck 2.  For the burning in the buttocks, start gabapentin 100mg  at bedtime.  Call in the next 2 weeks to let us know if we have to increase dose.  It may help for headache too 3.  Follow up in 3 months.

## 2015-05-13 NOTE — Progress Notes (Signed)
NEUROLOGY CONSULTATION NOTE  KEIGAN SEMONES MRN: [id number] DOB: [birth year]  Referring provider: Dr. Glori Bickers Primary care provider: Dr. Glori Bickers  Reason for consult:  headache  HISTORY OF PRESENT ILLNESS: Amy Herman is a 69 year old right-handed female who presents for headache and buttock pain.  History obtained by patient, her husband and PCP note.  Images of head CT and lumbar X-ray reviewed.  Onset:  In July, she tripped and hit the back of head on right.  In August, she started having headache. Location:  Back of head on right Quality:  Non-throbbing Intensity:  dull Aura:  no Prodrome:  no Associated symptoms:  no Duration:  Usually 5 to 20 minutes Frequency:  Usually at night in bed Triggers/exacerbating factors:  Laying on right side in bed Relieving factors:  Applying pressure to right paraspinal region  Past abortive medication:  none Past preventative medication:  none Other past therapy:  none  Current abortive medication:  Tylenol Other therapy:  Cold/warm compresses  CT of head from 03/19/15 was normal.  Recently, she began experiencing burning in both buttocks and knees when laying in bed.  It improves when she walks.  She denies back pain.  6 years ago, she was evaluated for radicular pain and X-ray revealed severe central stenosis at L4-5.  Since she was feeling well, surgery was not pursued.  PAST MEDICAL HISTORY: Past Medical History  Diagnosis Date  . Allergic rhinitis   . Hypertension   . GERD (gastroesophageal reflux disease)   . Osteoarthritis of knee   . Hyperlipidemia   . Bronchitis     more frequent in the past  . Osteopenia   . Colon polyps     history of  . Glaucoma     suspected  . Heart murmur     PAST SURGICAL HISTORY: Past Surgical History  Procedure Laterality Date  . Carpal tunnel release    . Bunionectomy      bilateral-PIN LEFT GREAT TOE  . Knee arthroscopy      right  . Tubal ligation      bilateral  . Vein  surgery  2006    legs  . Functional endoscopic sinus surgery      05-04-2011    MEDICATIONS: Current Outpatient Prescriptions on File Prior to Visit  Medication Sig Dispense Refill  . aspirin 81 MG EC tablet Take 81 mg by mouth daily.      Marland Kitchen atorvastatin (LIPITOR) 20 MG tablet Take one pill by mouth twice weekly 8 tablet 5  . calcium citrate-vitamin D (CITRACAL+D) 315-200 MG-UNIT per tablet Take 1 tablet by mouth daily.      . cetirizine (ZYRTEC) 10 MG tablet Take 10 mg by mouth daily as needed for allergies.    Marland Kitchen esomeprazole (NEXIUM) 40 MG capsule Take 1 capsule (40 mg total) by mouth daily before breakfast. 90 capsule 1  . fluticasone (FLONASE) 50 MCG/ACT nasal spray Place 2 sprays into the nose daily. 2 sprays in each nostril daily 16 g 1  . Garlic (ODOR FREE GARLIC) 123XX123 MG TABS Take 2 tablets by mouth 2 (two) times daily.     . meloxicam (MOBIC) 15 MG tablet Take 1 tablet by mouth  daily with food for pain 90 tablet 3  . potassium chloride (K-DUR) 10 MEQ tablet Take 2 tablets (20 mEq  total) by mouth daily. 180 tablet 3  . SINGULAIR 10 MG tablet Take 10 mg by mouth at bedtime as needed.     Marland Kitchen  triamterene-hydrochlorothiazide (MAXZIDE-25) 37.5-25 MG per tablet Take 1 tablet by mouth  daily 90 tablet 3  . valsartan (DIOVAN) 160 MG tablet Take 1 tablet by mouth  daily 90 tablet 1  . vitamin D, CHOLECALCIFEROL, 400 UNITS tablet Take 400 Units by mouth 2 (two) times daily.       No current facility-administered medications on file prior to visit.    ALLERGIES: Allergies  Allergen Reactions  . Cefuroxime     Other reaction(s): RASH  . Cefuroxime Axetil     REACTION: bumps on tounge  . Clarithromycin Other (See Comments)    Burning sensation to stomach  . Levofloxacin   . Losartan     cough  . Other     Other reaction(s): Unknown  . Rosuvastatin     REACTION: severe muscle pain    FAMILY HISTORY: Family History  Problem Relation Age of Onset  . Cancer Mother     colon,  lungs, liver  . Colon cancer Mother 109  . Cancer Father     prostate  . Heart disease Father     A-fib  . Stroke Father     TIA's  . Alcohol abuse Brother   . Cancer Brother     pancreatic, smoker  . Diabetes Brother   . Diabetes Brother   . Stomach cancer Neg Hx     SOCIAL HISTORY: Social History   Social History  . Marital Status: Married    Spouse Name: N/A  . Number of Children: N/A  . Years of Education: N/A   Occupational History  . [employer]     over 30 years   Social History Main Topics  . Smoking status: Never Smoker   . Smokeless tobacco: Never Used     Comment: used to have secondary smoke exposure from husband  . Alcohol Use: No  . Drug Use: No  . Sexual Activity: Not on file   Other Topics Concern  . Not on file   Social History Narrative   Teaches arobics, takes spinning class.  Plans to retire from [employer] in next several years.  Lives with husband.    REVIEW OF SYSTEMS: Constitutional: No fevers, chills, or sweats, no generalized fatigue, change in appetite Eyes: No visual changes, double vision, eye pain Ear, nose and throat: No hearing loss, ear pain, nasal congestion, sore throat Cardiovascular: No chest pain, palpitations Respiratory:  No shortness of breath at rest or with exertion, wheezes GastrointestinaI: No nausea, vomiting, diarrhea, abdominal pain, fecal incontinence Genitourinary:  No dysuria, urinary retention or frequency Musculoskeletal:  No neck pain, back pain Integumentary: No rash, pruritus, skin lesions Neurological: as above Psychiatric: No depression, insomnia, anxiety Endocrine: No palpitations, fatigue, diaphoresis, mood swings, change in appetite, change in weight, increased thirst Hematologic/Lymphatic:  No anemia, purpura, petechiae. Allergic/Immunologic: no itchy/runny eyes, nasal congestion, recent allergic reactions, rashes  PHYSICAL EXAM: Filed Vitals:   05/13/15 0931  BP: 110/70  Pulse: 92   General: No  acute distress.  Patient appears well-groomed.  Head:  Normocephalic/atraumatic Eyes:  fundi unremarkable, without vessel changes, exudates, hemorrhages or papilledema. Neck: supple, mild right sided paraspinal tenderness, full range of motion Back: No paraspinal tenderness Heart: regular rate and rhythm Lungs: Clear to auscultation bilaterally. Vascular: No carotid bruits. Neurological Exam: Mental status: alert and oriented to person, place, and time, recent and remote memory intact, fund of knowledge intact, attention and concentration intact, speech fluent and not dysarthric, language intact. Cranial nerves: CN I: not tested CN  II: pupils equal, round and reactive to light, visual fields intact, fundi unremarkable, without vessel changes, exudates, hemorrhages or papilledema. CN III, IV, VI:  full range of motion, no nystagmus, no ptosis CN V: facial sensation intact CN VII: upper and lower face symmetric CN VIII: hearing intact CN IX, X: gag intact, uvula midline CN XI: sternocleidomastoid and trapezius muscles intact CN XII: tongue midline Bulk & Tone: normal, no fasciculations. Motor:  5/5 throughout Sensation: temperature and vibration sensation intact. Deep Tendon Reflexes:  2+ throughout, toes downgoing. Finger to nose testing:  Without dysmetria.  Heel to shin:  Without dysmetria.  Gait:  Normal station and stride.  Able to turn and tandem walk. Romberg negative.  IMPRESSION: Post-traumatic headache Lumbar stenosis  PLAN: 1.  PT of neck for headache 2.  Start gabapentin 100mg  at night.  She should call in 2 weeks if pain persists 3.  Follow up in 3 months.  Thank you for allowing me to take part in the care of this patient.  Metta Clines, DO  CC:  Loura Pardon, MD

## 2015-05-13 NOTE — Addendum Note (Signed)
Addended byAnnamaria Helling on: 05/13/2015 11:33 AM   Modules accepted: Orders

## 2015-05-18 ENCOUNTER — Ambulatory Visit: Payer: BLUE CROSS/BLUE SHIELD | Admitting: Neurology

## 2015-06-01 DIAGNOSIS — J208 Acute bronchitis due to other specified organisms: Secondary | ICD-10-CM | POA: Diagnosis not present

## 2015-06-01 DIAGNOSIS — R0981 Nasal congestion: Secondary | ICD-10-CM | POA: Diagnosis not present

## 2015-06-07 ENCOUNTER — Telehealth: Payer: Self-pay | Admitting: Neurology

## 2015-06-07 DIAGNOSIS — M542 Cervicalgia: Secondary | ICD-10-CM

## 2015-06-07 NOTE — Telephone Encounter (Signed)
Spoke with patient. She has not heard from [facility]. Would like to switch referral for someone in [place]. Okay to switch referral?

## 2015-06-07 NOTE — Telephone Encounter (Signed)
That would be fine 

## 2015-06-07 NOTE — Telephone Encounter (Signed)
Referral faxed to [place], [place] location.

## 2015-06-07 NOTE — Telephone Encounter (Signed)
Pt called concerning a therapy appt? Call back @ [phone]

## 2015-06-16 ENCOUNTER — Telehealth: Payer: Self-pay

## 2015-06-16 MED ORDER — VALSARTAN 160 MG PO TABS: ORAL_TABLET | ORAL | Status: DC

## 2015-06-16 NOTE — Telephone Encounter (Signed)
Pt left v/m requesting cb about valsartan; left v/m requesting pt to cb.

## 2015-06-16 NOTE — Telephone Encounter (Signed)
Pt [facility] with [facility] and [facility] not have refill for valsartan; pt request refill resent to [facility]; advised pt done.

## 2015-06-16 NOTE — Telephone Encounter (Signed)
Pt left v/m requesting cb; left v/m requesting pt to cb. 

## 2015-06-25 DIAGNOSIS — J3089 Other allergic rhinitis: Secondary | ICD-10-CM | POA: Diagnosis not present

## 2015-07-01 DIAGNOSIS — M542 Cervicalgia: Secondary | ICD-10-CM | POA: Diagnosis not present

## 2015-07-18 DIAGNOSIS — R0982 Postnasal drip: Secondary | ICD-10-CM | POA: Diagnosis not present

## 2015-07-18 DIAGNOSIS — H6502 Acute serous otitis media, left ear: Secondary | ICD-10-CM | POA: Diagnosis not present

## 2015-07-18 DIAGNOSIS — J302 Other seasonal allergic rhinitis: Secondary | ICD-10-CM | POA: Diagnosis not present

## 2015-07-23 DIAGNOSIS — J3089 Other allergic rhinitis: Secondary | ICD-10-CM | POA: Diagnosis not present

## 2015-07-29 ENCOUNTER — Ambulatory Visit (INDEPENDENT_AMBULATORY_CARE_PROVIDER_SITE_OTHER): Payer: Medicare Other

## 2015-07-29 DIAGNOSIS — Z23 Encounter for immunization: Secondary | ICD-10-CM

## 2015-07-30 DIAGNOSIS — J301 Allergic rhinitis due to pollen: Secondary | ICD-10-CM | POA: Diagnosis not present

## 2015-07-30 DIAGNOSIS — J3089 Other allergic rhinitis: Secondary | ICD-10-CM | POA: Diagnosis not present

## 2015-08-03 DIAGNOSIS — J3089 Other allergic rhinitis: Secondary | ICD-10-CM | POA: Diagnosis not present

## 2015-08-09 DIAGNOSIS — J0110 Acute frontal sinusitis, unspecified: Secondary | ICD-10-CM | POA: Diagnosis not present

## 2015-08-09 DIAGNOSIS — R0982 Postnasal drip: Secondary | ICD-10-CM | POA: Diagnosis not present

## 2015-08-12 ENCOUNTER — Ambulatory Visit (INDEPENDENT_AMBULATORY_CARE_PROVIDER_SITE_OTHER): Payer: Medicare Other | Admitting: *Deleted

## 2015-08-12 DIAGNOSIS — Z23 Encounter for immunization: Secondary | ICD-10-CM

## 2015-08-12 DIAGNOSIS — J3089 Other allergic rhinitis: Secondary | ICD-10-CM | POA: Diagnosis not present

## 2015-08-20 DIAGNOSIS — J3089 Other allergic rhinitis: Secondary | ICD-10-CM | POA: Diagnosis not present

## 2015-08-26 ENCOUNTER — Other Ambulatory Visit: Payer: Self-pay | Admitting: Family Medicine

## 2015-09-02 DIAGNOSIS — J3089 Other allergic rhinitis: Secondary | ICD-10-CM | POA: Diagnosis not present

## 2015-09-13 ENCOUNTER — Ambulatory Visit: Payer: BLUE CROSS/BLUE SHIELD | Admitting: Neurology

## 2015-09-14 DIAGNOSIS — J3089 Other allergic rhinitis: Secondary | ICD-10-CM | POA: Diagnosis not present

## 2015-09-16 DIAGNOSIS — J3089 Other allergic rhinitis: Secondary | ICD-10-CM | POA: Diagnosis not present

## 2015-09-21 DIAGNOSIS — H401131 Primary open-angle glaucoma, bilateral, mild stage: Secondary | ICD-10-CM | POA: Diagnosis not present

## 2015-09-23 DIAGNOSIS — J3089 Other allergic rhinitis: Secondary | ICD-10-CM | POA: Diagnosis not present

## 2015-09-27 DIAGNOSIS — R05 Cough: Secondary | ICD-10-CM | POA: Diagnosis not present

## 2015-09-27 DIAGNOSIS — B9689 Other specified bacterial agents as the cause of diseases classified elsewhere: Secondary | ICD-10-CM | POA: Diagnosis not present

## 2015-09-27 DIAGNOSIS — J019 Acute sinusitis, unspecified: Secondary | ICD-10-CM | POA: Diagnosis not present

## 2015-09-30 DIAGNOSIS — J3089 Other allergic rhinitis: Secondary | ICD-10-CM | POA: Diagnosis not present

## 2015-10-05 DIAGNOSIS — J3089 Other allergic rhinitis: Secondary | ICD-10-CM | POA: Diagnosis not present

## 2015-10-12 DIAGNOSIS — J3089 Other allergic rhinitis: Secondary | ICD-10-CM | POA: Diagnosis not present

## 2015-10-21 DIAGNOSIS — J3089 Other allergic rhinitis: Secondary | ICD-10-CM | POA: Diagnosis not present

## 2015-11-03 DIAGNOSIS — J320 Chronic maxillary sinusitis: Secondary | ICD-10-CM | POA: Diagnosis not present

## 2015-11-04 DIAGNOSIS — J3089 Other allergic rhinitis: Secondary | ICD-10-CM | POA: Diagnosis not present

## 2015-11-17 ENCOUNTER — Other Ambulatory Visit: Payer: Self-pay | Admitting: Family Medicine

## 2015-11-18 DIAGNOSIS — J301 Allergic rhinitis due to pollen: Secondary | ICD-10-CM | POA: Diagnosis not present

## 2015-11-18 DIAGNOSIS — R05 Cough: Secondary | ICD-10-CM | POA: Diagnosis not present

## 2015-11-18 DIAGNOSIS — J3089 Other allergic rhinitis: Secondary | ICD-10-CM | POA: Diagnosis not present

## 2015-11-18 DIAGNOSIS — J019 Acute sinusitis, unspecified: Secondary | ICD-10-CM | POA: Diagnosis not present

## 2015-11-18 DIAGNOSIS — J209 Acute bronchitis, unspecified: Secondary | ICD-10-CM | POA: Diagnosis not present

## 2015-11-19 ENCOUNTER — Other Ambulatory Visit: Payer: Self-pay | Admitting: *Deleted

## 2015-11-19 MED ORDER — TRIAMTERENE-HCTZ 37.5-25 MG PO TABS: 1.0000 | ORAL_TABLET | Freq: Every day | ORAL | Status: DC

## 2015-11-19 NOTE — Telephone Encounter (Signed)
Fax refill request, no recent/future f/u or CPE scheduled please advise

## 2015-11-19 NOTE — Telephone Encounter (Signed)
appt scheduled and med refilled 

## 2015-11-19 NOTE — Telephone Encounter (Signed)
Please schedule 30 mi f/u (or PE if she pref) for the fall and refill until then thanks

## 2015-11-23 DIAGNOSIS — J3089 Other allergic rhinitis: Secondary | ICD-10-CM | POA: Diagnosis not present

## 2015-11-30 DIAGNOSIS — J3089 Other allergic rhinitis: Secondary | ICD-10-CM | POA: Diagnosis not present

## 2015-11-30 DIAGNOSIS — J301 Allergic rhinitis due to pollen: Secondary | ICD-10-CM | POA: Diagnosis not present

## 2015-12-07 DIAGNOSIS — J3089 Other allergic rhinitis: Secondary | ICD-10-CM | POA: Diagnosis not present

## 2015-12-16 DIAGNOSIS — J3089 Other allergic rhinitis: Secondary | ICD-10-CM | POA: Diagnosis not present

## 2015-12-21 DIAGNOSIS — J3089 Other allergic rhinitis: Secondary | ICD-10-CM | POA: Diagnosis not present

## 2015-12-31 DIAGNOSIS — J3089 Other allergic rhinitis: Secondary | ICD-10-CM | POA: Diagnosis not present

## 2016-01-19 DIAGNOSIS — M170 Bilateral primary osteoarthritis of knee: Secondary | ICD-10-CM | POA: Diagnosis not present

## 2016-01-23 DIAGNOSIS — J0100 Acute maxillary sinusitis, unspecified: Secondary | ICD-10-CM | POA: Diagnosis not present

## 2016-01-24 ENCOUNTER — Ambulatory Visit
Admission: RE | Admit: 2016-01-24 | Discharge: 2016-01-24 | Disposition: A | Discharge status: Home / Self Care | Payer: Medicare Other | Source: Ambulatory Visit | Attending: Orthopedic Surgery | Admitting: Orthopedic Surgery

## 2016-01-24 ENCOUNTER — Other Ambulatory Visit: Payer: Self-pay | Admitting: Orthopedic Surgery

## 2016-01-24 DIAGNOSIS — M25562 Pain in left knee: Principal | ICD-10-CM

## 2016-01-24 DIAGNOSIS — M25561 Pain in right knee: Secondary | ICD-10-CM

## 2016-01-24 DIAGNOSIS — M170 Bilateral primary osteoarthritis of knee: Secondary | ICD-10-CM | POA: Diagnosis not present

## 2016-01-26 DIAGNOSIS — M170 Bilateral primary osteoarthritis of knee: Secondary | ICD-10-CM | POA: Diagnosis not present

## 2016-01-28 DIAGNOSIS — J3089 Other allergic rhinitis: Secondary | ICD-10-CM | POA: Diagnosis not present

## 2016-02-04 DIAGNOSIS — J3089 Other allergic rhinitis: Secondary | ICD-10-CM | POA: Diagnosis not present

## 2016-02-09 DIAGNOSIS — M170 Bilateral primary osteoarthritis of knee: Secondary | ICD-10-CM | POA: Diagnosis not present

## 2016-02-11 DIAGNOSIS — J3089 Other allergic rhinitis: Secondary | ICD-10-CM | POA: Diagnosis not present

## 2016-02-14 ENCOUNTER — Other Ambulatory Visit: Payer: Self-pay | Admitting: Family Medicine

## 2016-02-16 ENCOUNTER — Encounter: Payer: Self-pay | Admitting: Family Medicine

## 2016-02-16 ENCOUNTER — Ambulatory Visit (INDEPENDENT_AMBULATORY_CARE_PROVIDER_SITE_OTHER): Payer: Medicare Other | Admitting: Family Medicine

## 2016-02-16 VITALS — BP 106/62 | HR 71 | Temp 98.0°F | Ht 62.0 in | Wt 152.8 lb

## 2016-02-16 DIAGNOSIS — I10 Essential (primary) hypertension: Secondary | ICD-10-CM | POA: Diagnosis not present

## 2016-02-16 DIAGNOSIS — Z209 Contact with and (suspected) exposure to unspecified communicable disease: Secondary | ICD-10-CM

## 2016-02-16 DIAGNOSIS — E785 Hyperlipidemia, unspecified: Secondary | ICD-10-CM

## 2016-02-16 DIAGNOSIS — E2839 Other primary ovarian failure: Secondary | ICD-10-CM | POA: Diagnosis not present

## 2016-02-16 DIAGNOSIS — M8580 Other specified disorders of bone density and structure, unspecified site: Secondary | ICD-10-CM

## 2016-02-16 LAB — COMPREHENSIVE METABOLIC PANEL
ALBUMIN: 3.8 g/dL (ref 3.5–5.2)
ALK PHOS: 88 U/L (ref 39–117)
ALT: 18 U/L (ref 0–35)
AST: 20 U/L (ref 0–37)
BILIRUBIN TOTAL: 0.3 mg/dL (ref 0.2–1.2)
BUN: 14 mg/dL (ref 6–23)
CO2: 27 mEq/L (ref 19–32)
Calcium: 9.4 mg/dL (ref 8.4–10.5)
Chloride: 104 mEq/L (ref 96–112)
Creatinine, Ser: 0.89 mg/dL (ref 0.40–1.20)
GFR: 66.45 mL/min (ref >60.00)
GLUCOSE: 86 mg/dL (ref 70–99)
POTASSIUM: 3.9 meq/L (ref 3.5–5.1)
SODIUM: 137 meq/L (ref 135–145)
TOTAL PROTEIN: 7 g/dL (ref 6.0–8.3)

## 2016-02-16 LAB — CBC WITH DIFFERENTIAL/PLATELET
BASOS ABS: 0.1 10*3/uL (ref 0.0–0.1)
Basophils Relative: 1.7 % (ref 0.0–3.0)
EOS PCT: 7.7 % — AB (ref 0.0–5.0)
Eosinophils Absolute: 0.5 10*3/uL (ref 0.0–0.7)
HCT: 37.7 % (ref 36.0–46.0)
HEMOGLOBIN: 13 g/dL (ref 12.0–15.0)
Lymphocytes Relative: 34.2 % (ref 12.0–46.0)
Lymphs Abs: 2.3 10*3/uL (ref 0.7–4.0)
MCHC: 34.4 g/dL (ref 30.0–36.0)
MCV: 88.6 fl (ref 78.0–100.0)
MONOS PCT: 7.8 % (ref 3.0–12.0)
Monocytes Absolute: 0.5 10*3/uL (ref 0.1–1.0)
NEUTROS PCT: 48.6 % (ref 43.0–77.0)
Neutro Abs: 3.3 10*3/uL (ref 1.4–7.7)
Platelets: 305 10*3/uL (ref 150.0–400.0)
RBC: 4.26 Mil/uL (ref 3.87–5.11)
RDW: 12.5 % (ref 11.5–15.5)
WBC: 6.8 10*3/uL (ref 4.0–10.5)

## 2016-02-16 LAB — LIPID PANEL
CHOLESTEROL: 223 mg/dL — AB (ref 0–200)
HDL: 57.3 mg/dL (ref >39.00)
LDL Cholesterol: 138 mg/dL — ABNORMAL HIGH (ref 0–99)
NONHDL: 166.19
Total CHOL/HDL Ratio: 4
Triglycerides: 139 mg/dL (ref 0.0–149.0)
VLDL: 27.8 mg/dL (ref 0.0–40.0)

## 2016-02-16 LAB — TSH: TSH: 0.93 u[IU]/mL (ref 0.35–4.50)

## 2016-02-16 MED ORDER — POTASSIUM CHLORIDE ER 10 MEQ PO TBCR: EXTENDED_RELEASE_TABLET | ORAL | 3 refills | Status: DC

## 2016-02-16 MED ORDER — VALSARTAN 160 MG PO TABS: ORAL_TABLET | ORAL | 3 refills | Status: DC

## 2016-02-16 MED ORDER — TRIAMTERENE-HCTZ 37.5-25 MG PO TABS: 1.0000 | ORAL_TABLET | Freq: Every day | ORAL | 3 refills | Status: DC

## 2016-02-16 NOTE — Assessment & Plan Note (Signed)
Adding hep C screening to labs today

## 2016-02-16 NOTE — Progress Notes (Signed)
Pre visit review using our clinic review tool, if applicable. No additional management support is needed unless otherwise documented below in the visit note. 

## 2016-02-16 NOTE — Progress Notes (Signed)
Subjective:    Patient ID: Amy Herman, female    DOB: 09/22/1945, 70 y.o.   MRN: [id number]  HPI Here for f/u of chronic health problems   Is feeling good -doing great overall   occ sinus problems- still a little sinus drainage No sinus pain or fever  Still using steroid nasal spray and nasal saline   Took care of her husband with prostate cancer- doing better however     Wt Readings from Last 3 Encounters:  02/16/16 152 lb 12 oz (69.3 kg)  05/13/15 152 lb 7 oz (69.1 kg)  03/17/15 153 lb 4 oz (69.5 kg)  bmi is 27.9 Eats healthy and gets good exercise (weights and gym)  bp is stable today (great!) No cp or palpitations or headaches or edema  No side effects to medicines  BP Readings from Last 3 Encounters:  02/16/16 106/62  05/13/15 110/70  03/17/15 128/68    On diovan and maxzide and K   On meloxicam for knee OA Sees ortho/ Dr Jamesetta Geralds shots - lubricant Lanetta Inch helping   Hx of osteopenia  dexa 2012 - was in [place] ca and D No fractures  Good exerciser   Hx of hyperlipidemia Lab Results  Component Value Date   CHOL 222 (H) 04/03/2014   HDL 62 04/03/2014   LDLCALC 138 (H) 04/03/2014   TRIG 112 04/03/2014   CHOLHDL 3.6 04/03/2014   on lipitor and diet  Has not had it checked for a while  Only takes her cholesterol pill once per week- it makes her ache    Patient Active Problem List   Diagnosis Date Noted  . Estrogen deficiency 02/16/2016  . Contact with or exposure to communicable disease 02/16/2016  . Pre-operative cardiovascular examination 07/21/2013  . GERD (gastroesophageal reflux disease) 06/18/2013  . Varicose veins of lower extremities with other complications 0000000  . Lumbar disc disease 08/15/2011  . Joint pain 08/15/2011  . Osteopenia 01/31/2011  . Post-menopausal 01/31/2011  . Hypokalemia 01/31/2011  . Hyperlipidemia 03/24/2008  . ESSENTIAL HYPERTENSION, BENIGN 03/24/2008  . COLONIC POLYPS, HX OF 03/24/2008   Past  Medical History:  Diagnosis Date  . Allergic rhinitis   . Bronchitis    more frequent in the past  . Colon polyps    history of  . GERD (gastroesophageal reflux disease)   . Glaucoma    suspected  . Heart murmur   . Hyperlipidemia   . Hypertension   . Osteoarthritis of knee   . Osteopenia    Past Surgical History:  Procedure Laterality Date  . BUNIONECTOMY     bilateral-PIN LEFT GREAT TOE  . CARPAL TUNNEL RELEASE    . FUNCTIONAL ENDOSCOPIC SINUS SURGERY     05-04-2011  . KNEE ARTHROSCOPY     right  . TUBAL LIGATION     bilateral  . VEIN SURGERY  2006   legs   Social History  Substance Use Topics  . Smoking status: Never Smoker  . Smokeless tobacco: Never Used     Comment: used to have secondary smoke exposure from husband  . Alcohol use No   Family History  Problem Relation Age of Onset  . Cancer Mother     colon, lungs, liver  . Colon cancer Mother 38  . Cancer Father     prostate  . Heart disease Father     A-fib  . Stroke Father     TIA's  . Alcohol abuse Brother   .  Cancer Brother     pancreatic, smoker  . Diabetes Brother   . Diabetes Brother   . Stomach cancer Neg Hx    Allergies  Allergen Reactions  . Cefuroxime     Other reaction(s): RASH  . Cefuroxime Axetil     REACTION: bumps on tounge  . Clarithromycin Other (See Comments)    Burning sensation to stomach  . Levofloxacin   . Lipitor [Atorvastatin]     Muscle pain   . Losartan     cough  . Other     Other reaction(s): Unknown  . Rosuvastatin     REACTION: severe muscle pain   Current Outpatient Prescriptions on File Prior to Visit  Medication Sig Dispense Refill  . Ascorbic Acid (VITAMIN C) 1000 MG tablet Take by mouth.    Marland Kitchen aspirin 81 MG EC tablet Take 81 mg by mouth daily.      . bimatoprost (LUMIGAN) 0.01 % SOLN     . calcium carbonate (OS-CAL - DOSED IN MG OF ELEMENTAL CALCIUM) 1250 (500 CA) MG tablet Take by mouth.    . calcium citrate-vitamin D (CITRACAL+D) 315-200 MG-UNIT  per tablet Take 1 tablet by mouth daily.      . cetirizine (ZYRTEC) 10 MG tablet Take 10 mg by mouth daily as needed for allergies.    Marland Kitchen esomeprazole (NEXIUM) 40 MG capsule Take 1 capsule (40 mg total) by mouth daily before breakfast. 90 capsule 1  . fluticasone (FLONASE) 50 MCG/ACT nasal spray Place 2 sprays into the nose daily. 2 sprays in each nostril daily 16 g 1  . gabapentin (NEURONTIN) 100 MG capsule Take 1 capsule (100 mg total) by mouth at bedtime. 30 capsule 3  . Garlic (ODOR FREE GARLIC) 123XX123 MG TABS Take 2 tablets by mouth 2 (two) times daily.     . meloxicam (MOBIC) 15 MG tablet Take 1 tablet by mouth  daily with food for pain 90 tablet 3  . SINGULAIR 10 MG tablet Take 10 mg by mouth at bedtime as needed.     . vitamin D, CHOLECALCIFEROL, 400 UNITS tablet Take 400 Units by mouth 2 (two) times daily.       No current facility-administered medications on file prior to visit.     Review of Systems    Review of Systems  Constitutional: Negative for fever, appetite change, fatigue and unexpected weight change.  Eyes: Negative for pain and visual disturbance.  Respiratory: Negative for cough and shortness of breath.   Cardiovascular: Negative for cp or palpitations    Gastrointestinal: Negative for nausea, diarrhea and constipation.  Genitourinary: Negative for urgency and frequency.  Skin: Negative for pallor or rash   Neurological: Negative for weakness, light-headedness, numbness and headaches.  Hematological: Negative for adenopathy. Does not bruise/bleed easily.  Psychiatric/Behavioral: Negative for dysphoric mood. The patient is not nervous/anxious.  pos for stressors     Objective:   Physical Exam  Constitutional: She appears well-developed and well-nourished. No distress.  overwt and well appearing  Looks younger than stated age  energetic  HENT:  Head: Normocephalic and atraumatic.  Mouth/Throat: Oropharynx is clear and moist.  Eyes: Conjunctivae and EOM are normal.  Pupils are equal, round, and reactive to light.  Neck: Normal range of motion. Neck supple. No JVD present. Carotid bruit is not present. No thyromegaly present.  Cardiovascular: Normal rate, regular rhythm, normal heart sounds and intact distal pulses.  Exam reveals no gallop.   Pulmonary/Chest: Effort normal and breath sounds normal.  No respiratory distress. She has no wheezes. She has no rales.  No crackles  Abdominal: Soft. Bowel sounds are normal. She exhibits no distension, no abdominal bruit and no mass. There is no tenderness.  Musculoskeletal: She exhibits no edema.  No kyphosis   Lymphadenopathy:    She has no cervical adenopathy.  Neurological: She is alert. She has normal reflexes.  Skin: Skin is warm and dry. No rash noted.  Psychiatric: She has a normal mood and affect.          Assessment & Plan:   Problem List Items Addressed This Visit      Cardiovascular and Mediastinum   ESSENTIAL HYPERTENSION, BENIGN - Primary    bp in fair control at this time  BP Readings from Last 1 Encounters:  02/16/16 106/62   No changes needed Disc lifstyle change with low sodium diet and exercise  Labs today  Good health habits        Relevant Medications   valsartan (DIOVAN) 160 MG tablet   triamterene-hydrochlorothiazide (MAXZIDE-25) 37.5-25 MG tablet   Other Relevant Orders   CBC with Differential/Platelet   Comprehensive metabolic panel   Lipid panel   TSH     Musculoskeletal and Integument   Osteopenia    Overdue for dexa No fractures On ca and D  Great exercise         Other   Hyperlipidemia    Intolerant of lipitor- myalgias Only took 1 time weekly-inst to stop it Lab today  Disc goals for lipids and reasons to control them Rev labs with pt (from last lab) Rev low sat fat diet in detail       Relevant Medications   valsartan (DIOVAN) 160 MG tablet   triamterene-hydrochlorothiazide (MAXZIDE-25) 37.5-25 MG tablet   Other Relevant Orders   Lipid  panel   Estrogen deficiency   Relevant Orders   DG Bone Density   Contact with or exposure to communicable disease    Adding hep C screening to labs today      Relevant Orders   Hepatitis C antibody    Other Visit Diagnoses   None.

## 2016-02-16 NOTE — Patient Instructions (Addendum)
Stop at check out for referral for bone density test  Stop the cholesterol medicine  Omega 3 is fine Checking labs today  For cholesterol keep watching diet and exercising

## 2016-02-16 NOTE — Assessment & Plan Note (Signed)
bp in fair control at this time  BP Readings from Last 1 Encounters:  02/16/16 106/62   No changes needed Disc lifstyle change with low sodium diet and exercise  Labs today  Good health habits

## 2016-02-16 NOTE — Assessment & Plan Note (Signed)
Intolerant of lipitor- myalgias Only took 1 time weekly-inst to stop it Lab today  Disc goals for lipids and reasons to control them Rev labs with pt (from last lab) Rev low sat fat diet in detail

## 2016-02-16 NOTE — Assessment & Plan Note (Signed)
Overdue for dexa No fractures On ca and D  Great exercise

## 2016-02-18 ENCOUNTER — Encounter: Payer: Self-pay | Admitting: *Deleted

## 2016-02-18 DIAGNOSIS — J3089 Other allergic rhinitis: Secondary | ICD-10-CM | POA: Diagnosis not present

## 2016-02-21 ENCOUNTER — Telehealth: Payer: Self-pay

## 2016-02-21 MED ORDER — DOXYCYCLINE HYCLATE 100 MG PO TABS: 100.0000 mg | ORAL_TABLET | Freq: Two times a day (BID) | ORAL | 0 refills | Status: DC

## 2016-02-21 NOTE — Telephone Encounter (Signed)
Patient advised.

## 2016-02-21 NOTE — Telephone Encounter (Signed)
Pt was seen 02/16/16 having some sinus drainage; today S/T with yellow mucus when blows nose; not a lot of non prod cough; sneezing and h/a; no fever. Pt request abx to [facility] [address].

## 2016-02-21 NOTE — Telephone Encounter (Signed)
I sent doxycycline for a presumed sinus infection  F/u if no imp in 10 d

## 2016-02-25 DIAGNOSIS — J3089 Other allergic rhinitis: Secondary | ICD-10-CM | POA: Diagnosis not present

## 2016-03-03 DIAGNOSIS — J3089 Other allergic rhinitis: Secondary | ICD-10-CM | POA: Diagnosis not present

## 2016-03-10 DIAGNOSIS — J3089 Other allergic rhinitis: Secondary | ICD-10-CM | POA: Diagnosis not present

## 2016-03-17 DIAGNOSIS — J3089 Other allergic rhinitis: Secondary | ICD-10-CM | POA: Diagnosis not present

## 2016-03-21 ENCOUNTER — Ambulatory Visit: Payer: BLUE CROSS/BLUE SHIELD | Attending: Family Medicine

## 2016-03-28 DIAGNOSIS — H401131 Primary open-angle glaucoma, bilateral, mild stage: Secondary | ICD-10-CM | POA: Diagnosis not present

## 2016-04-13 DIAGNOSIS — J3089 Other allergic rhinitis: Secondary | ICD-10-CM | POA: Diagnosis not present

## 2016-04-27 DIAGNOSIS — J3089 Other allergic rhinitis: Secondary | ICD-10-CM | POA: Diagnosis not present

## 2016-05-08 ENCOUNTER — Ambulatory Visit (INDEPENDENT_AMBULATORY_CARE_PROVIDER_SITE_OTHER): Payer: Medicare Other

## 2016-05-08 ENCOUNTER — Telehealth (INDEPENDENT_AMBULATORY_CARE_PROVIDER_SITE_OTHER): Payer: Medicare Other | Admitting: Family Medicine

## 2016-05-08 ENCOUNTER — Telehealth: Payer: Self-pay | Admitting: Family Medicine

## 2016-05-08 VITALS — BP 118/72 | HR 75 | Temp 98.4°F | Ht 60.0 in | Wt 142.5 lb

## 2016-05-08 DIAGNOSIS — I10 Essential (primary) hypertension: Secondary | ICD-10-CM

## 2016-05-08 DIAGNOSIS — Z209 Contact with and (suspected) exposure to unspecified communicable disease: Secondary | ICD-10-CM | POA: Diagnosis not present

## 2016-05-08 DIAGNOSIS — Z23 Encounter for immunization: Secondary | ICD-10-CM | POA: Diagnosis not present

## 2016-05-08 DIAGNOSIS — Z1231 Encounter for screening mammogram for malignant neoplasm of breast: Secondary | ICD-10-CM | POA: Insufficient documentation

## 2016-05-08 DIAGNOSIS — Z0000 Encounter for general adult medical examination without abnormal findings: Secondary | ICD-10-CM | POA: Diagnosis not present

## 2016-05-08 DIAGNOSIS — E7800 Pure hypercholesterolemia, unspecified: Secondary | ICD-10-CM

## 2016-05-08 LAB — CBC WITH DIFFERENTIAL/PLATELET
BASOS PCT: 1.9 % (ref 0.0–3.0)
Basophils Absolute: 0.1 10*3/uL (ref 0.0–0.1)
EOS ABS: 0.3 10*3/uL (ref 0.0–0.7)
Eosinophils Relative: 5.1 % — ABNORMAL HIGH (ref 0.0–5.0)
HEMATOCRIT: 39.3 % (ref 36.0–46.0)
Hemoglobin: 13.5 g/dL (ref 12.0–15.0)
Lymphocytes Relative: 30 % (ref 12.0–46.0)
Lymphs Abs: 1.7 10*3/uL (ref 0.7–4.0)
MCHC: 34.2 g/dL (ref 30.0–36.0)
MCV: 88.2 fl (ref 78.0–100.0)
MONO ABS: 0.8 10*3/uL (ref 0.1–1.0)
Monocytes Relative: 13.6 % — ABNORMAL HIGH (ref 3.0–12.0)
NEUTROS ABS: 2.8 10*3/uL (ref 1.4–7.7)
Neutrophils Relative %: 49.4 % (ref 43.0–77.0)
PLATELETS: 319 10*3/uL (ref 150.0–400.0)
RBC: 4.46 Mil/uL (ref 3.87–5.11)
RDW: 12.9 % (ref 11.5–15.5)
WBC: 5.6 10*3/uL (ref 4.0–10.5)

## 2016-05-08 LAB — COMPREHENSIVE METABOLIC PANEL
ALT: 15 U/L (ref 0–35)
AST: 17 U/L (ref 0–37)
Albumin: 4.1 g/dL (ref 3.5–5.2)
Alkaline Phosphatase: 84 U/L (ref 39–117)
BUN: 14 mg/dL (ref 6–23)
CALCIUM: 9.8 mg/dL (ref 8.4–10.5)
CHLORIDE: 101 meq/L (ref 96–112)
CO2: 31 meq/L (ref 19–32)
CREATININE: 0.83 mg/dL (ref 0.40–1.20)
GFR: 71.98 mL/min (ref >60.00)
Glucose, Bld: 84 mg/dL (ref 70–99)
POTASSIUM: 3.5 meq/L (ref 3.5–5.1)
Sodium: 139 mEq/L (ref 135–145)
Total Bilirubin: 0.3 mg/dL (ref 0.2–1.2)
Total Protein: 7.1 g/dL (ref 6.0–8.3)

## 2016-05-08 LAB — LIPID PANEL
CHOLESTEROL: 251 mg/dL — AB (ref 0–200)
HDL: 65.1 mg/dL (ref >39.00)
LDL CALC: 159 mg/dL — AB (ref 0–99)
NonHDL: 186
TRIGLYCERIDES: 133 mg/dL (ref 0.0–149.0)
Total CHOL/HDL Ratio: 4
VLDL: 26.6 mg/dL (ref 0.0–40.0)

## 2016-05-08 LAB — TSH: TSH: 1.27 u[IU]/mL (ref 0.35–4.50)

## 2016-05-08 NOTE — Progress Notes (Signed)
Subjective:   Amy Herman is a 70 y.o. female who presents for an Initial Medicare Annual Wellness Visit.  Review of Systems    N/A  Cardiac Risk Factors include: advanced age (>22men, >35 women);dyslipidemia;hypertension     Objective:    Today's Vitals   05/08/16 1423  BP: 118/72  Pulse: 75  Temp: 98.4 F (36.9 C)  TempSrc: Oral  SpO2: 95%  Weight: 142 lb 8 oz (64.6 kg)  Height: 5' (1.524 m)  PainSc: 0-No pain   Body mass index is 27.83 kg/m.   Current Medications (verified) Outpatient Encounter Prescriptions as of 05/08/2016  Medication Sig  . Ascorbic Acid (VITAMIN C) 1000 MG tablet Take by mouth.  Marland Kitchen aspirin 81 MG EC tablet Take 81 mg by mouth daily.    . bimatoprost (LUMIGAN) 0.01 % SOLN   . calcium carbonate (OS-CAL - DOSED IN MG OF ELEMENTAL CALCIUM) 1250 (500 CA) MG tablet Take by mouth.  . calcium citrate-vitamin D (CITRACAL+D) 315-200 MG-UNIT per tablet Take 1 tablet by mouth daily.    . cetirizine (ZYRTEC) 10 MG tablet Take 10 mg by mouth daily as needed for allergies.  Marland Kitchen esomeprazole (NEXIUM) 40 MG capsule Take 1 capsule (40 mg total) by mouth daily before breakfast.  . fluticasone (FLONASE) 50 MCG/ACT nasal spray Place 2 sprays into the nose daily. 2 sprays in each nostril daily  . gabapentin (NEURONTIN) 100 MG capsule Take 1 capsule (100 mg total) by mouth at bedtime.  . Garlic (ODOR FREE GARLIC) 123XX123 MG TABS Take 2 tablets by mouth 2 (two) times daily.   . meloxicam (MOBIC) 15 MG tablet Take 1 tablet by mouth  daily with food for pain  . potassium chloride (K-DUR) 10 MEQ tablet Take 2 tablets (20 mEq  total) by mouth daily.  Marland Kitchen SINGULAIR 10 MG tablet Take 10 mg by mouth at bedtime as needed.   . triamterene-hydrochlorothiazide (MAXZIDE-25) 37.5-25 MG tablet Take 1 tablet by mouth daily.  . valsartan (DIOVAN) 160 MG tablet Take 1 tablet by mouth  daily  . vitamin D, CHOLECALCIFEROL, 400 UNITS tablet Take 400 Units by mouth 2 (two) times daily.    .  [DISCONTINUED] doxycycline (VIBRA-TABS) 100 MG tablet Take 1 tablet (100 mg total) by mouth 2 (two) times daily.   No facility-administered encounter medications on file as of 05/08/2016.     Allergies (verified) Cefuroxime; Cefuroxime axetil; Clarithromycin; Levofloxacin; Lipitor [atorvastatin]; Losartan; Other; and Rosuvastatin   History: Past Medical History:  Diagnosis Date  . Allergic rhinitis   . Bronchitis    more frequent in the past  . Colon polyps    history of  . GERD (gastroesophageal reflux disease)   . Glaucoma    suspected  . Heart murmur   . Hyperlipidemia   . Hypertension   . Osteoarthritis of knee   . Osteopenia    Past Surgical History:  Procedure Laterality Date  . BUNIONECTOMY     bilateral-PIN LEFT GREAT TOE  . CARPAL TUNNEL RELEASE    . FUNCTIONAL ENDOSCOPIC SINUS SURGERY     05-04-2011  . KNEE ARTHROSCOPY     right  . TUBAL LIGATION     bilateral  . VEIN SURGERY  2006   legs   Family History  Problem Relation Age of Onset  . Cancer Mother     colon, lungs, liver  . Colon cancer Mother 2  . Cancer Father     prostate  . Heart disease Father  A-fib  . Stroke Father     TIA's  . Alcohol abuse Brother   . Cancer Brother     pancreatic, smoker  . Diabetes Brother   . Diabetes Brother   . Stomach cancer Neg Hx    Social History   Occupational History  . [employer]     over 30 years   Social History Main Topics  . Smoking status: Never Smoker  . Smokeless tobacco: Never Used     Comment: used to have secondary smoke exposure from husband  . Alcohol use No  . Drug use: No  . Sexual activity: No    Tobacco Counseling Counseling given: No   Activities of Daily Living In your present state of health, do you have any difficulty performing the following activities: 05/08/2016  Hearing? N  Vision? N  Difficulty concentrating or making decisions? N  Walking or climbing stairs? N  Dressing or bathing? N  Doing errands, shopping?  N  Preparing Food and eating ? N  Using the Toilet? N  In the past six months, have you accidently leaked urine? N  Do you have problems with loss of bowel control? N  Managing your Medications? N  Managing your Finances? N  Housekeeping or managing your Housekeeping? N  Some recent data might be hidden    Immunizations and Health Maintenance Immunization History  Administered Date(s) Administered  . Influenza Split 06/08/2011  . Influenza Whole 05/03/2009  . Influenza,inj,Quad PF,36+ Mos 04/10/2013, 06/03/2014, 07/29/2015, 05/08/2016  . Pneumococcal Conjugate-13 08/12/2015  . Pneumococcal Polysaccharide-23 07/03/2004, 01/31/2011  . Td 07/23/2006  . Zoster 06/13/2007   There are no preventive care reminders to display for this patient.  Patient Care Team: Abner Greenspan, MD as PCP - General Linus Mako, MD as Attending Physician (Family Medicine)     Assessment:   This is a routine wellness examination for Amy Herman.   Hearing/Vision screen  Hearing Screening   125Hz  250Hz  500Hz  1000Hz  2000Hz  3000Hz  4000Hz  6000Hz  8000Hz   Right ear:   40 40 40  40    Left ear:   40 40 40  40    Vision Screening Comments: Last vision exam with Dr. Sandra Cockayne in 2017  Dietary issues and exercise activities discussed: Current Exercise Habits: Home exercise routine, Type of exercise: calisthenics, Time (Minutes): 60, Frequency (Times/Week): 5, Weekly Exercise (Minutes/Week): 300, Exercise limited by: None identified  Goals    . Increase physical activity          Starting 05/08/2016, I will continue to exercise for at least 60 min 5 days per week.       Depression Screen PHQ 2/9 Scores 05/08/2016 06/28/2012  PHQ - 2 Score 0 0    Fall Risk Fall Risk  05/08/2016 05/13/2015 06/28/2012  Falls in the past year? No Yes No  Number falls in past yr: - 1 -  Injury with Fall? - Yes -  Risk for fall due to : - Other (Comment) -  Follow up - Falls evaluation completed;Falls prevention discussed  -    Cognitive Function: MMSE - Mini Mental State Exam 05/08/2016  Orientation to time 5  Orientation to Place 5  Registration 3  Attention/ Calculation 0  Recall 3  Language- name 2 objects 0  Language- repeat 1  Language- follow 3 step command 3  Language- read & follow direction 0  Write a sentence 0  Copy design 0  Total score 20     PLEASE NOTE: A Mini-Cog  screen was completed. Maximum score is 20. A value of 0 denotes this part of Folstein MMSE was not completed or the patient failed this part of the Mini-Cog screening.   Mini-Cog Screening Orientation to Time - Max 5 pts Orientation to Place - Max 5 pts Registration - Max 3 pts Recall - Max 3 pts Language Repeat - Max 1 pts Language Follow 3 Step Command - Max 3 pts     Screening Tests Health Maintenance  Topic Date Due  . TETANUS/TDAP  07/23/2016  . MAMMOGRAM  08/31/2016  . COLONOSCOPY  06/19/2017  . INFLUENZA VACCINE  Completed  . DEXA SCAN  Completed  . ZOSTAVAX  Completed  . Hepatitis C Screening  Completed  . PNA vac Low Risk Adult  Completed      Plan:     I have personally reviewed and addressed the Medicare Annual Wellness questionnaire and have noted the following in the patient's chart:  A. Medical and social history B. Use of alcohol, tobacco or illicit drugs  C. Current medications and supplements D. Functional ability and status E.  Nutritional status F.  Physical activity G. Advance directives H. List of other physicians I.  Hospitalizations, surgeries, and ER visits in previous 12 months J.  [place] to include hearing, vision, cognitive, depression L. Referrals and appointments - none  In addition, I have reviewed and discussed with patient certain preventive protocols, quality metrics, and best practice recommendations. A written personalized care plan for preventive services as well as general preventive health recommendations were provided to patient.  See attached  scanned questionnaire for additional information.   Signed,   Lindell Noe, MHA, BS, LPN Health Coach

## 2016-05-08 NOTE — Progress Notes (Signed)
PCP notes:   Health maintenance:  Hep C screening - completed Flu vaccine - administered Mammogram - pt will schedule future appt Bone density - pt will schedule future appt  Abnormal screenings:   None  Patient concerns:   None  Nurse concerns:  None  Next PCP appt:   05/22/16 @ 1545  I reviewed health advisor's note, was available for consultation, and agree with documentation and plan. Loura Pardon MD

## 2016-05-08 NOTE — Telephone Encounter (Signed)
-----   Message from Eustace Pen, LPN sent at [id number] 12:10 PM EST ----- Regarding: Lab Orders for today 05/08/16 Please place lab orders for today, if any. Thank you :)

## 2016-05-08 NOTE — Telephone Encounter (Signed)
-----   Message from Virgina Organ sent at 05/08/2016  4:50 PM EST ----- Regarding: MM orders needed Dr. Glori Bickers,  Pt would like to get her Mammogram scheduled at [facility] if you would place the order that would be great.    Vaughan Basta

## 2016-05-08 NOTE — Telephone Encounter (Signed)
Order done - thanks

## 2016-05-08 NOTE — Patient Instructions (Signed)
Amy Herman , Thank you for taking time to come for your Medicare Wellness Visit. I appreciate your ongoing commitment to your health goals. Please review the following plan we discussed and let me know if I can assist you in the future.   These are the goals we discussed: Goals    . Increase physical activity          Starting 05/08/2016, I will continue to exercise for at least 60 min 5 days per week.        This is a list of the screening recommended for you and due dates:  Health Maintenance  Topic Date Due  . Tetanus Vaccine  07/23/2016  . Mammogram  08/31/2016  . Colon Cancer Screening  06/19/2017  . Flu Shot  Completed  . DEXA scan (bone density measurement)  Completed  . Shingles Vaccine  Completed  .  Hepatitis C: One time screening is recommended by Center for Disease Control  (CDC) for  adults born from [birth year] through 1966.   Completed  . Pneumonia vaccines  Completed   Preventive Care for Adults  A healthy lifestyle and preventive care can promote health and wellness. Preventive health guidelines for adults include the following key practices.  . A routine yearly physical is a good way to check with your health care provider about your health and preventive screening. It is a chance to share any concerns and updates on your health and to receive a thorough exam.  . Visit your dentist for a routine exam and preventive care every 6 months. Brush your teeth twice a day and floss once a day. Good oral hygiene prevents tooth decay and gum disease.  . The frequency of eye exams is based on your age, health, family medical history, use  of contact lenses, and other factors. Follow your health care provider's ecommendations for frequency of eye exams.  . Eat a healthy diet. Foods like vegetables, fruits, whole grains, low-fat dairy products, and lean protein foods contain the nutrients you need without too many calories. Decrease your intake of foods high in solid fats, added  sugars, and salt. Eat the right amount of calories for you. Get information about a proper diet from your health care provider, if necessary.  . Regular physical exercise is one of the most important things you can do for your health. Most adults should get at least 150 minutes of moderate-intensity exercise (any activity that increases your heart rate and causes you to sweat) each week. In addition, most adults need muscle-strengthening exercises on 2 or more days a week.  Silver Sneakers may be a benefit available to you. To determine eligibility, you may visit the website: www.silversneakers.com or contact program at 503-389-2789 Mon-Fri between 8AM-8PM.   . Maintain a healthy weight. The body mass index (BMI) is a screening tool to identify possible weight problems. It provides an estimate of body fat based on height and weight. Your health care provider can find your BMI and can help you achieve or maintain a healthy weight.   For adults 20 years and older: ? A BMI below 18.5 is considered underweight. ? A BMI of 18.5 to 24.9 is normal. ? A BMI of 25 to 29.9 is considered overweight. ? A BMI of 30 and above is considered obese.   . Maintain normal blood lipids and cholesterol levels by exercising and minimizing your intake of saturated fat. Eat a balanced diet with plenty of fruit and vegetables. Blood tests for lipids  and cholesterol should begin at age 38 and be repeated every 5 years. If your lipid or cholesterol levels are high, you are over 50, or you are at high risk for heart disease, you may need your cholesterol levels checked more frequently. Ongoing high lipid and cholesterol levels should be treated with medicines if diet and exercise are not working.  . If you smoke, find out from your health care provider how to quit. If you do not use tobacco, please do not start.  . If you choose to drink alcohol, please do not consume more than 2 drinks per day. One drink is considered to  be 12 ounces (355 mL) of beer, 5 ounces (148 mL) of wine, or 1.5 ounces (44 mL) of liquor.  . If you are 45-61 years old, ask your health care provider if you should take aspirin to prevent strokes.  . Use sunscreen. Apply sunscreen liberally and repeatedly throughout the day. You should seek shade when your shadow is shorter than you. Protect yourself by wearing long sleeves, pants, a wide-brimmed hat, and sunglasses year round, whenever you are outdoors.  . Once a month, do a whole body skin exam, using a mirror to look at the skin on your back. Tell your health care provider of new moles, moles that have irregular borders, moles that are larger than a pencil eraser, or moles that have changed in shape or color.

## 2016-05-08 NOTE — Progress Notes (Signed)
Pre visit review using our clinic review tool, if applicable. No additional management support is needed unless otherwise documented below in the visit note. 

## 2016-05-09 LAB — HEPATITIS C ANTIBODY: HCV Ab: NEGATIVE

## 2016-05-11 DIAGNOSIS — J0100 Acute maxillary sinusitis, unspecified: Secondary | ICD-10-CM | POA: Diagnosis not present

## 2016-05-11 DIAGNOSIS — R0982 Postnasal drip: Secondary | ICD-10-CM | POA: Diagnosis not present

## 2016-05-18 DIAGNOSIS — J3089 Other allergic rhinitis: Secondary | ICD-10-CM | POA: Diagnosis not present

## 2016-05-19 NOTE — Telephone Encounter (Signed)
Thank you for scheduling Amy Herman!

## 2016-05-22 ENCOUNTER — Encounter: Payer: BLUE CROSS/BLUE SHIELD | Admitting: Family Medicine

## 2016-06-08 DIAGNOSIS — J301 Allergic rhinitis due to pollen: Secondary | ICD-10-CM | POA: Diagnosis not present

## 2016-06-13 DIAGNOSIS — J3089 Other allergic rhinitis: Secondary | ICD-10-CM | POA: Diagnosis not present

## 2016-06-16 ENCOUNTER — Other Ambulatory Visit: Payer: Self-pay | Admitting: Family Medicine

## 2016-06-18 DIAGNOSIS — J988 Other specified respiratory disorders: Secondary | ICD-10-CM | POA: Diagnosis not present

## 2016-06-20 DIAGNOSIS — J3089 Other allergic rhinitis: Secondary | ICD-10-CM | POA: Diagnosis not present

## 2016-06-29 ENCOUNTER — Ambulatory Visit: Payer: Medicare Other

## 2016-06-29 ENCOUNTER — Other Ambulatory Visit: Payer: Medicare Other

## 2016-07-02 DIAGNOSIS — J069 Acute upper respiratory infection, unspecified: Secondary | ICD-10-CM | POA: Diagnosis not present

## 2016-07-11 DIAGNOSIS — J3089 Other allergic rhinitis: Secondary | ICD-10-CM | POA: Diagnosis not present

## 2016-07-27 DIAGNOSIS — R05 Cough: Secondary | ICD-10-CM | POA: Diagnosis not present

## 2016-07-27 DIAGNOSIS — J019 Acute sinusitis, unspecified: Secondary | ICD-10-CM | POA: Diagnosis not present

## 2016-07-27 DIAGNOSIS — J3089 Other allergic rhinitis: Secondary | ICD-10-CM | POA: Diagnosis not present

## 2016-07-27 DIAGNOSIS — J301 Allergic rhinitis due to pollen: Secondary | ICD-10-CM | POA: Diagnosis not present

## 2016-08-04 DIAGNOSIS — M4726 Other spondylosis with radiculopathy, lumbar region: Secondary | ICD-10-CM | POA: Diagnosis not present

## 2016-08-04 DIAGNOSIS — M4155 Other secondary scoliosis, thoracolumbar region: Secondary | ICD-10-CM | POA: Diagnosis not present

## 2016-08-04 DIAGNOSIS — M4316 Spondylolisthesis, lumbar region: Secondary | ICD-10-CM | POA: Diagnosis not present

## 2016-08-04 DIAGNOSIS — M5481 Occipital neuralgia: Secondary | ICD-10-CM | POA: Diagnosis not present

## 2016-08-04 DIAGNOSIS — M5136 Other intervertebral disc degeneration, lumbar region: Secondary | ICD-10-CM | POA: Diagnosis not present

## 2016-08-04 DIAGNOSIS — M549 Dorsalgia, unspecified: Secondary | ICD-10-CM | POA: Diagnosis not present

## 2016-08-04 DIAGNOSIS — R51 Headache: Secondary | ICD-10-CM | POA: Diagnosis not present

## 2016-08-04 DIAGNOSIS — M5416 Radiculopathy, lumbar region: Secondary | ICD-10-CM | POA: Diagnosis not present

## 2016-08-04 DIAGNOSIS — M4722 Other spondylosis with radiculopathy, cervical region: Secondary | ICD-10-CM | POA: Diagnosis not present

## 2016-08-04 DIAGNOSIS — M542 Cervicalgia: Secondary | ICD-10-CM | POA: Diagnosis not present

## 2016-08-04 DIAGNOSIS — M5030 Other cervical disc degeneration, unspecified cervical region: Secondary | ICD-10-CM | POA: Diagnosis not present

## 2016-08-08 ENCOUNTER — Ambulatory Visit
Admission: RE | Admit: 2016-08-08 | Discharge: 2016-08-08 | Disposition: A | Discharge status: Home / Self Care | Payer: Medicare Other | Source: Ambulatory Visit | Attending: Family Medicine | Admitting: Family Medicine

## 2016-08-08 ENCOUNTER — Other Ambulatory Visit: Payer: Self-pay | Admitting: Family Medicine

## 2016-08-08 DIAGNOSIS — Z1231 Encounter for screening mammogram for malignant neoplasm of breast: Secondary | ICD-10-CM | POA: Diagnosis not present

## 2016-08-08 DIAGNOSIS — J3089 Other allergic rhinitis: Secondary | ICD-10-CM | POA: Diagnosis not present

## 2016-08-08 DIAGNOSIS — E2839 Other primary ovarian failure: Secondary | ICD-10-CM | POA: Diagnosis not present

## 2016-08-08 DIAGNOSIS — Z780 Asymptomatic menopausal state: Secondary | ICD-10-CM | POA: Diagnosis not present

## 2016-08-08 DIAGNOSIS — M8580 Other specified disorders of bone density and structure, unspecified site: Secondary | ICD-10-CM | POA: Insufficient documentation

## 2016-08-08 LAB — HM MAMMOGRAPHY

## 2016-08-09 DIAGNOSIS — M4726 Other spondylosis with radiculopathy, lumbar region: Secondary | ICD-10-CM | POA: Diagnosis not present

## 2016-08-09 DIAGNOSIS — M4722 Other spondylosis with radiculopathy, cervical region: Secondary | ICD-10-CM | POA: Diagnosis not present

## 2016-08-09 DIAGNOSIS — M542 Cervicalgia: Secondary | ICD-10-CM | POA: Diagnosis not present

## 2016-08-09 DIAGNOSIS — M545 Low back pain: Secondary | ICD-10-CM | POA: Diagnosis not present

## 2016-08-11 DIAGNOSIS — Z23 Encounter for immunization: Secondary | ICD-10-CM | POA: Diagnosis not present

## 2016-08-15 DIAGNOSIS — J3089 Other allergic rhinitis: Secondary | ICD-10-CM | POA: Diagnosis not present

## 2016-08-16 ENCOUNTER — Other Ambulatory Visit: Payer: Self-pay | Admitting: *Deleted

## 2016-08-16 ENCOUNTER — Inpatient Hospital Stay
Admission: RE | Admit: 2016-08-16 | Discharge: 2016-08-16 | Disposition: A | Discharge status: Home / Self Care | Payer: Self-pay | Source: Ambulatory Visit | Attending: *Deleted | Admitting: *Deleted

## 2016-08-16 DIAGNOSIS — M5136 Other intervertebral disc degeneration, lumbar region: Secondary | ICD-10-CM | POA: Diagnosis not present

## 2016-08-16 DIAGNOSIS — M4316 Spondylolisthesis, lumbar region: Secondary | ICD-10-CM | POA: Diagnosis not present

## 2016-08-16 DIAGNOSIS — M4155 Other secondary scoliosis, thoracolumbar region: Secondary | ICD-10-CM | POA: Diagnosis not present

## 2016-08-16 DIAGNOSIS — Z9289 Personal history of other medical treatment: Secondary | ICD-10-CM

## 2016-08-16 DIAGNOSIS — M5481 Occipital neuralgia: Secondary | ICD-10-CM | POA: Diagnosis not present

## 2016-08-16 DIAGNOSIS — M4722 Other spondylosis with radiculopathy, cervical region: Secondary | ICD-10-CM | POA: Diagnosis not present

## 2016-08-16 DIAGNOSIS — M5416 Radiculopathy, lumbar region: Secondary | ICD-10-CM | POA: Diagnosis not present

## 2016-08-16 DIAGNOSIS — M4726 Other spondylosis with radiculopathy, lumbar region: Secondary | ICD-10-CM | POA: Diagnosis not present

## 2016-08-16 DIAGNOSIS — M5030 Other cervical disc degeneration, unspecified cervical region: Secondary | ICD-10-CM | POA: Diagnosis not present

## 2016-08-17 ENCOUNTER — Encounter: Payer: Self-pay | Admitting: *Deleted

## 2016-08-24 DIAGNOSIS — J3089 Other allergic rhinitis: Secondary | ICD-10-CM | POA: Diagnosis not present

## 2016-08-29 DIAGNOSIS — J3089 Other allergic rhinitis: Secondary | ICD-10-CM | POA: Diagnosis not present

## 2016-09-05 DIAGNOSIS — J3089 Other allergic rhinitis: Secondary | ICD-10-CM | POA: Diagnosis not present

## 2016-09-14 DIAGNOSIS — J3089 Other allergic rhinitis: Secondary | ICD-10-CM | POA: Diagnosis not present

## 2016-09-19 DIAGNOSIS — J3089 Other allergic rhinitis: Secondary | ICD-10-CM | POA: Diagnosis not present

## 2016-09-25 DIAGNOSIS — H401131 Primary open-angle glaucoma, bilateral, mild stage: Secondary | ICD-10-CM | POA: Diagnosis not present

## 2016-09-26 DIAGNOSIS — J3089 Other allergic rhinitis: Secondary | ICD-10-CM | POA: Diagnosis not present

## 2016-10-03 DIAGNOSIS — J3089 Other allergic rhinitis: Secondary | ICD-10-CM | POA: Diagnosis not present

## 2016-10-10 DIAGNOSIS — J3089 Other allergic rhinitis: Secondary | ICD-10-CM | POA: Diagnosis not present

## 2016-10-17 DIAGNOSIS — J3089 Other allergic rhinitis: Secondary | ICD-10-CM | POA: Diagnosis not present

## 2016-10-26 DIAGNOSIS — J3089 Other allergic rhinitis: Secondary | ICD-10-CM | POA: Diagnosis not present

## 2016-10-31 ENCOUNTER — Other Ambulatory Visit: Payer: Self-pay | Admitting: Family Medicine

## 2016-11-02 DIAGNOSIS — J3089 Other allergic rhinitis: Secondary | ICD-10-CM | POA: Diagnosis not present

## 2016-11-07 DIAGNOSIS — J3089 Other allergic rhinitis: Secondary | ICD-10-CM | POA: Diagnosis not present

## 2016-11-09 DIAGNOSIS — J301 Allergic rhinitis due to pollen: Secondary | ICD-10-CM | POA: Diagnosis not present

## 2016-11-09 DIAGNOSIS — R05 Cough: Secondary | ICD-10-CM | POA: Diagnosis not present

## 2016-11-09 DIAGNOSIS — J3089 Other allergic rhinitis: Secondary | ICD-10-CM | POA: Diagnosis not present

## 2016-11-15 DIAGNOSIS — M170 Bilateral primary osteoarthritis of knee: Secondary | ICD-10-CM | POA: Diagnosis not present

## 2016-11-16 DIAGNOSIS — J3089 Other allergic rhinitis: Secondary | ICD-10-CM | POA: Diagnosis not present

## 2016-11-21 DIAGNOSIS — M25571 Pain in right ankle and joints of right foot: Secondary | ICD-10-CM | POA: Diagnosis not present

## 2016-11-21 DIAGNOSIS — M79672 Pain in left foot: Secondary | ICD-10-CM | POA: Diagnosis not present

## 2016-11-21 DIAGNOSIS — S93491A Sprain of other ligament of right ankle, initial encounter: Secondary | ICD-10-CM | POA: Diagnosis not present

## 2016-11-21 DIAGNOSIS — M2022 Hallux rigidus, left foot: Secondary | ICD-10-CM | POA: Diagnosis not present

## 2016-11-21 DIAGNOSIS — Q6621 Congenital metatarsus primus varus: Secondary | ICD-10-CM | POA: Diagnosis not present

## 2016-11-22 DIAGNOSIS — M170 Bilateral primary osteoarthritis of knee: Secondary | ICD-10-CM | POA: Diagnosis not present

## 2016-11-23 DIAGNOSIS — J3089 Other allergic rhinitis: Secondary | ICD-10-CM | POA: Diagnosis not present

## 2016-11-28 DIAGNOSIS — J3089 Other allergic rhinitis: Secondary | ICD-10-CM | POA: Diagnosis not present

## 2016-11-29 DIAGNOSIS — N3000 Acute cystitis without hematuria: Secondary | ICD-10-CM | POA: Diagnosis not present

## 2016-11-29 DIAGNOSIS — M170 Bilateral primary osteoarthritis of knee: Secondary | ICD-10-CM | POA: Diagnosis not present

## 2016-11-29 DIAGNOSIS — R300 Dysuria: Secondary | ICD-10-CM | POA: Diagnosis not present

## 2016-12-05 DIAGNOSIS — J3089 Other allergic rhinitis: Secondary | ICD-10-CM | POA: Diagnosis not present

## 2016-12-05 DIAGNOSIS — S93491D Sprain of other ligament of right ankle, subsequent encounter: Secondary | ICD-10-CM | POA: Diagnosis not present

## 2016-12-06 DIAGNOSIS — M170 Bilateral primary osteoarthritis of knee: Secondary | ICD-10-CM | POA: Diagnosis not present

## 2016-12-09 DIAGNOSIS — J0100 Acute maxillary sinusitis, unspecified: Secondary | ICD-10-CM | POA: Diagnosis not present

## 2016-12-12 DIAGNOSIS — J3089 Other allergic rhinitis: Secondary | ICD-10-CM | POA: Diagnosis not present

## 2016-12-19 ENCOUNTER — Other Ambulatory Visit: Payer: Self-pay | Admitting: Family Medicine

## 2016-12-19 DIAGNOSIS — M659 Synovitis and tenosynovitis, unspecified: Secondary | ICD-10-CM | POA: Diagnosis not present

## 2016-12-19 DIAGNOSIS — M25571 Pain in right ankle and joints of right foot: Secondary | ICD-10-CM | POA: Diagnosis not present

## 2016-12-19 DIAGNOSIS — M19072 Primary osteoarthritis, left ankle and foot: Secondary | ICD-10-CM | POA: Diagnosis not present

## 2016-12-19 DIAGNOSIS — S93491D Sprain of other ligament of right ankle, subsequent encounter: Secondary | ICD-10-CM | POA: Diagnosis not present

## 2016-12-20 ENCOUNTER — Other Ambulatory Visit: Payer: Self-pay | Admitting: Family Medicine

## 2016-12-20 NOTE — Telephone Encounter (Signed)
Please schedule a fall f/u and refill until then  

## 2016-12-20 NOTE — Telephone Encounter (Signed)
No recent/future appts., please advise  

## 2016-12-21 NOTE — Telephone Encounter (Signed)
That is fine-thanks for making me aware

## 2016-12-21 NOTE — Telephone Encounter (Signed)
appt scheduled for 02/07/17 and med refill  **pt did want me to let Dr. Glori Bickers know that she has been having a really rough time. Pt lost her husband to cancer in November 2017, a few days later they found her sister in the bathroom and she had passed away too, pt said shortly after that her nephew has been in and out of the hospital with CAD and she has been trying to help him too. Pt feels like she hasn't had time to grieve her losses and hasn't really had time to process anything or take care of herself. Pt just wanted Dr. Glori Bickers to be aware of what's going on with her. Pt didn't want to schedule any appts sooner because she just started working a new job on 12/18/16 and doesn't want to miss any days yet

## 2016-12-28 DIAGNOSIS — J0181 Other acute recurrent sinusitis: Secondary | ICD-10-CM | POA: Diagnosis not present

## 2016-12-29 DIAGNOSIS — J3089 Other allergic rhinitis: Secondary | ICD-10-CM | POA: Diagnosis not present

## 2017-01-05 DIAGNOSIS — J3089 Other allergic rhinitis: Secondary | ICD-10-CM | POA: Diagnosis not present

## 2017-01-09 DIAGNOSIS — J3089 Other allergic rhinitis: Secondary | ICD-10-CM | POA: Diagnosis not present

## 2017-01-11 DIAGNOSIS — J3089 Other allergic rhinitis: Secondary | ICD-10-CM | POA: Diagnosis not present

## 2017-01-16 DIAGNOSIS — J3089 Other allergic rhinitis: Secondary | ICD-10-CM | POA: Diagnosis not present

## 2017-01-19 DIAGNOSIS — J3089 Other allergic rhinitis: Secondary | ICD-10-CM | POA: Diagnosis not present

## 2017-01-21 DIAGNOSIS — N390 Urinary tract infection, site not specified: Secondary | ICD-10-CM | POA: Diagnosis not present

## 2017-01-21 DIAGNOSIS — R300 Dysuria: Secondary | ICD-10-CM | POA: Diagnosis not present

## 2017-02-02 DIAGNOSIS — J3089 Other allergic rhinitis: Secondary | ICD-10-CM | POA: Diagnosis not present

## 2017-02-07 ENCOUNTER — Ambulatory Visit: Payer: Medicare Other | Admitting: Family Medicine

## 2017-02-09 DIAGNOSIS — J3089 Other allergic rhinitis: Secondary | ICD-10-CM | POA: Diagnosis not present

## 2017-02-16 DIAGNOSIS — J3089 Other allergic rhinitis: Secondary | ICD-10-CM | POA: Diagnosis not present

## 2017-02-19 ENCOUNTER — Ambulatory Visit (INDEPENDENT_AMBULATORY_CARE_PROVIDER_SITE_OTHER): Payer: Medicare Other | Admitting: Family Medicine

## 2017-02-19 ENCOUNTER — Ambulatory Visit (INDEPENDENT_AMBULATORY_CARE_PROVIDER_SITE_OTHER)
Admission: RE | Admit: 2017-02-19 | Discharge: 2017-02-19 | Disposition: A | Discharge status: Home / Self Care | Payer: Medicare Other | Source: Ambulatory Visit | Attending: Family Medicine | Admitting: Family Medicine

## 2017-02-19 ENCOUNTER — Encounter: Payer: Self-pay | Admitting: Family Medicine

## 2017-02-19 VITALS — BP 116/68 | HR 81 | Temp 97.9°F | Ht 62.0 in

## 2017-02-19 DIAGNOSIS — M25521 Pain in right elbow: Secondary | ICD-10-CM | POA: Diagnosis not present

## 2017-02-19 DIAGNOSIS — E7800 Pure hypercholesterolemia, unspecified: Secondary | ICD-10-CM | POA: Diagnosis not present

## 2017-02-19 DIAGNOSIS — K219 Gastro-esophageal reflux disease without esophagitis: Secondary | ICD-10-CM | POA: Diagnosis not present

## 2017-02-19 DIAGNOSIS — R300 Dysuria: Secondary | ICD-10-CM | POA: Diagnosis not present

## 2017-02-19 DIAGNOSIS — I10 Essential (primary) hypertension: Secondary | ICD-10-CM

## 2017-02-19 DIAGNOSIS — E876 Hypokalemia: Secondary | ICD-10-CM | POA: Diagnosis not present

## 2017-02-19 DIAGNOSIS — N3000 Acute cystitis without hematuria: Secondary | ICD-10-CM

## 2017-02-19 DIAGNOSIS — N390 Urinary tract infection, site not specified: Secondary | ICD-10-CM | POA: Insufficient documentation

## 2017-02-19 LAB — POC URINALSYSI DIPSTICK (AUTOMATED)
BILIRUBIN UA: NEGATIVE
Glucose, UA: NEGATIVE
KETONES UA: NEGATIVE
Nitrite, UA: NEGATIVE
PH UA: 6 (ref 5.0–8.0)
Protein, UA: NEGATIVE
RBC UA: NEGATIVE
Spec Grav, UA: 1.025 (ref 1.010–1.025)
Urobilinogen, UA: 0.2 E.U./dL

## 2017-02-19 MED ORDER — POTASSIUM CHLORIDE ER 10 MEQ PO TBCR: EXTENDED_RELEASE_TABLET | ORAL | 3 refills | Status: DC

## 2017-02-19 MED ORDER — TRIAMTERENE-HCTZ 37.5-25 MG PO TABS: 1.0000 | ORAL_TABLET | Freq: Every day | ORAL | 3 refills | Status: DC

## 2017-02-19 MED ORDER — MONTELUKAST SODIUM 10 MG PO TABS: 10.0000 mg | ORAL_TABLET | Freq: Every evening | ORAL | 3 refills | Status: DC | PRN

## 2017-02-19 MED ORDER — TELMISARTAN 80 MG PO TABS: 80.0000 mg | ORAL_TABLET | Freq: Every day | ORAL | 3 refills | Status: DC

## 2017-02-19 NOTE — Progress Notes (Signed)
Subjective:    Patient ID: Amy Herman, female    DOB: 08-01-1945, 71 y.o.   MRN: [id number]  HPI Here for f/u of chronic medical problems and some new issues   Lost her husband 8 mo ago  Then lost sister the next day  Doing fair  She went back to work  Faith helps her a lot   She takes good care of herself and works out     Having urinary symptoms  Went to 2 different walk in clinics - first tx with macrobid (improved ) and then sulfa (improved) but then symptoms returned after  Culture was done 7/22 and it showed 10,000 colonies  ua showed wbc and rbc   Symptoms: still has some mild bladder pain (mild)  No pain to urinate  Frequency- due to drinking water  A little cranberry juice today  No flank pain  No blood in urine  No n/v or fever   Results for orders placed or performed in visit on 02/19/17  POCT Urinalysis Dipstick (Automated)  Result Value Ref Range   Color, UA Yellow    Clarity, UA Clear    Glucose, UA Negative    Bilirubin, UA Negative    Ketones, UA Negative    Spec Grav, UA 1.025 1.010 - 1.025   Blood, UA Negative    pH, UA 6.0 5.0 - 8.0   Protein, UA Negative    Urobilinogen, UA 0.2 0.2 or 1.0 E.U./dL   Nitrite, UA Negative    Leukocytes, UA Small (1+) (A) Negative    She never had frequent utis  A little vaginal d/c - light in color (not all the time)  No itching or burning  Had a hysterectomy years ago (partial)   Drinks lots of water  She used to drink a coke a day and stopped  Infrequent lemonade  Works to stay hydrated   She bought a probiotic for urinary care    Elbow pain (fell on R arm in may)  Landed on her arm proximal to elbow  Very bruised  Never lost mobility  She is interested in an xray  She used ice on it initially  It is still slightly sore to lie on  (she also tore 3 ligaments in her R ankle -that is better)    Did not weigh today   Last tetanus shot Td was 12.08  bp is stable today  No cp or  palpitations or headaches or edema  No side effects to medicines  BP Readings from Last 3 Encounters:  02/19/17 116/68  05/08/16 118/72  02/16/16 106/62     Takes valsartan 160   (was recalled)  triamterine hct 37.5-25  Lab Results  Component Value Date   CREATININE 0.83 05/08/2016   BUN 14 05/08/2016   NA 139 05/08/2016   K 3.5 05/08/2016   CL 101 05/08/2016   CO2 31 05/08/2016       GERD No longer takes nexium if she eats right    Hyperlipidemia Lab Results  Component Value Date   CHOL 251 (H) 05/08/2016   HDL 65.10 05/08/2016   LDLCALC 159 (H) 05/08/2016   TRIG 133.0 05/08/2016   CHOLHDL 4 05/08/2016  tried atorvastatin and intolerant to it (even if she did not take it every day)   She would like to check labs today  Diet is really good!  Eating even better now   Patient Active Problem List   Diagnosis Date Noted  .  UTI (urinary tract infection) 02/19/2017  . Right elbow pain 02/19/2017  . Encounter for screening mammogram for breast cancer 05/08/2016  . Estrogen deficiency 02/16/2016  . Contact with or exposure to communicable disease 02/16/2016  . Pre-operative cardiovascular examination 07/21/2013  . GERD (gastroesophageal reflux disease) 06/18/2013  . Varicose veins of lower extremities with other complications 10/93/2355  . Lumbar disc disease 08/15/2011  . Joint pain 08/15/2011  . Osteopenia 01/31/2011  . Post-menopausal 01/31/2011  . Hypokalemia 01/31/2011  . Hyperlipidemia 03/24/2008  . ESSENTIAL HYPERTENSION, BENIGN 03/24/2008  . COLONIC POLYPS, HX OF 03/24/2008   Past Medical History:  Diagnosis Date  . Allergic rhinitis   . Bronchitis    more frequent in the past  . Colon polyps    history of  . GERD (gastroesophageal reflux disease)   . Glaucoma    suspected  . Heart murmur   . Hyperlipidemia   . Hypertension   . Osteoarthritis of knee   . Osteopenia    Past Surgical History:  Procedure Laterality Date  . BUNIONECTOMY      bilateral-PIN LEFT GREAT TOE  . CARPAL TUNNEL RELEASE    . FUNCTIONAL ENDOSCOPIC SINUS SURGERY     05-04-2011  . KNEE ARTHROSCOPY     right  . TUBAL LIGATION     bilateral  . VEIN SURGERY  2006   legs   Social History  Substance Use Topics  . Smoking status: Never Smoker  . Smokeless tobacco: Never Used     Comment: used to have secondary smoke exposure from husband  . Alcohol use No   Family History  Problem Relation Age of Onset  . Cancer Mother        colon, lungs, liver  . Colon cancer Mother 65  . Cancer Father        prostate  . Heart disease Father        A-fib  . Stroke Father        TIA's  . Alcohol abuse Brother   . Cancer Brother        pancreatic, smoker  . Diabetes Brother   . Diabetes Brother   . Stomach cancer Neg Hx    Allergies  Allergen Reactions  . Cefuroxime     Other reaction(s): RASH  . Cefuroxime Axetil     REACTION: bumps on tounge  . Clarithromycin Other (See Comments)    Burning sensation to stomach  . Levofloxacin   . Lipitor [Atorvastatin]     Muscle pain   . Losartan     cough  . Other     Other reaction(s): Unknown  . Rosuvastatin     REACTION: severe muscle pain   Current Outpatient Prescriptions on File Prior to Visit  Medication Sig Dispense Refill  . Ascorbic Acid (VITAMIN C) 1000 MG tablet Take by mouth.    Marland Kitchen aspirin 81 MG EC tablet Take 81 mg by mouth daily.      . bimatoprost (LUMIGAN) 0.01 % SOLN     . calcium carbonate (OS-CAL - DOSED IN MG OF ELEMENTAL CALCIUM) 1250 (500 CA) MG tablet Take by mouth.    . calcium citrate-vitamin D (CITRACAL+D) 315-200 MG-UNIT per tablet Take 1 tablet by mouth daily.      . cetirizine (ZYRTEC) 10 MG tablet Take 10 mg by mouth daily as needed for allergies.    . fluticasone (FLONASE) 50 MCG/ACT nasal spray Place 2 sprays into the nose daily. 2 sprays in each nostril daily  16 g 1  . gabapentin (NEURONTIN) 100 MG capsule Take 1 capsule (100 mg total) by mouth at bedtime. 30 capsule 3  .  Garlic (ODOR FREE GARLIC) 630 MG TABS Take 2 tablets by mouth 2 (two) times daily.     . meloxicam (MOBIC) 15 MG tablet Take 1 tablet by mouth  daily with food for pain 90 tablet 3  . potassium chloride (K-DUR,KLOR-CON) 10 MEQ tablet TAKE 2 TABLETS BY MOUTH  DAILY 180 tablet 1  . vitamin D, CHOLECALCIFEROL, 400 UNITS tablet Take 400 Units by mouth 2 (two) times daily.       No current facility-administered medications on file prior to visit.     Review of Systems Review of Systems  Constitutional: Negative for fever, appetite change, fatigue and unexpected weight change.  Eyes: Negative for pain and visual disturbance.  Respiratory: Negative for cough and shortness of breath.   Cardiovascular: Negative for cp or palpitations    Gastrointestinal: Negative for nausea, diarrhea and constipation.  Genitourinary: pos for urgency and frequency. neg for flank pain or hematuria  MSK pos for right elbow pain  Skin: Negative for pallor or rash   Neurological: Negative for weakness, light-headedness, numbness and headaches.  Hematological: Negative for adenopathy. Does not bruise/bleed easily.  Psychiatric/Behavioral: Negative for dysphoric mood. The patient is not nervous/anxious.pos for grief and stressors          Objective:   Physical Exam  Constitutional: She appears well-developed and well-nourished. No distress.  Well appearing   HENT:  Head: Normocephalic and atraumatic.  Mouth/Throat: Oropharynx is clear and moist.  Eyes: Pupils are equal, round, and reactive to light. Conjunctivae and EOM are normal.  Neck: Normal range of motion. Neck supple. No JVD present. Carotid bruit is not present. No thyromegaly present.  Cardiovascular: Normal rate, regular rhythm, normal heart sounds and intact distal pulses.  Exam reveals no gallop.   Pulmonary/Chest: Effort normal and breath sounds normal. No respiratory distress. She has no wheezes. She has no rales.  No crackles  Abdominal: Soft. Bowel  sounds are normal. She exhibits no distension, no abdominal bruit and no mass. There is no tenderness. There is no rebound.  No cva tenderness  Mild suprapubic tenderness  Musculoskeletal: She exhibits no edema.  Tender medial R elbow -mild  No obvious swelling or deformity No crepitus Nl rom and strength with opposed flex/ext and pronation   Lymphadenopathy:    She has no cervical adenopathy.  Neurological: She is alert. She has normal reflexes. No cranial nerve deficit. She exhibits normal muscle tone. Coordination normal.  Skin: Skin is warm and dry. No rash noted.  Psychiatric: She has a normal mood and affect.          Assessment & Plan:   Problem List Items Addressed This Visit      Cardiovascular and Mediastinum   ESSENTIAL HYPERTENSION, BENIGN    bp in fair control at this time  BP Readings from Last 1 Encounters:  02/19/17 116/68   No changes needed Disc lifstyle change with low sodium diet and exercise  Will change losartan to telmistartan (she is intol of losartan)  Due to recall valsartan Watch bp Watch for side eff       Relevant Medications   telmisartan (MICARDIS) 80 MG tablet   triamterene-hydrochlorothiazide (MAXZIDE-25) 37.5-25 MG tablet   Other Relevant Orders   CBC with Differential/Platelet   Comprehensive metabolic panel   Lipid panel   TSH  Digestive   GERD (gastroesophageal reflux disease)    Doing well w/o nexium now  Watching diet occ otc omeprazole        Genitourinary   UTI (urinary tract infection)    S/p tx times 2  Symptoms improved but not gone  Urine cx sent today -pending result Enc water intake and avoid other beverages       Relevant Orders   Urine Culture     Other   Hyperlipidemia    Disc goals for lipids and reasons to control them Rev labs with pt (last draw) Unfortunately intol of statin  Lab today with imp diet Rev low sat fat diet in detail       Relevant Medications   telmisartan (MICARDIS) 80 MG  tablet   triamterene-hydrochlorothiazide (MAXZIDE-25) 37.5-25 MG tablet   Hypokalemia    On supplementation Lab today      Right elbow pain    After a fall in may Some medial tenderness  xray now       Relevant Orders   DG Elbow Complete Right    Other Visit Diagnoses    Dysuria    -  Primary   Relevant Orders   POCT Urinalysis Dipstick (Automated) (Completed)

## 2017-02-19 NOTE — Patient Instructions (Addendum)
It is fine to try the probiotic for urinary care    Valsartan had a recall and you will not be able to get it when you run out  Here is a px for micardis to take when you need to fill it  Let me know if any side effects or problems or if blood pressure changes   Labs today   We will send your urine for a culture and let you know when it returns  Keep drinking water  Stop cranberry or other beverages   Xray of elbow today-we will get a result tomorrow

## 2017-02-19 NOTE — Assessment & Plan Note (Signed)
Doing well w/o nexium now  Watching diet occ otc omeprazole

## 2017-02-19 NOTE — Assessment & Plan Note (Signed)
On supplementation Lab today

## 2017-02-19 NOTE — Assessment & Plan Note (Signed)
After a fall in may Some medial tenderness  xray now

## 2017-02-19 NOTE — Assessment & Plan Note (Signed)
S/p tx times 2  Symptoms improved but not gone  Urine cx sent today -pending result Enc water intake and avoid other beverages

## 2017-02-19 NOTE — Assessment & Plan Note (Signed)
Disc goals for lipids and reasons to control them Rev labs with pt (last draw) Unfortunately intol of statin  Lab today with imp diet Rev low sat fat diet in detail

## 2017-02-19 NOTE — Assessment & Plan Note (Signed)
bp in fair control at this time  BP Readings from Last 1 Encounters:  02/19/17 116/68   No changes needed Disc lifstyle change with low sodium diet and exercise  Will change losartan to telmistartan (she is intol of losartan)  Due to recall valsartan Watch bp Watch for side eff

## 2017-02-20 LAB — CBC WITH DIFFERENTIAL/PLATELET
BASOS PCT: 2.5 % (ref 0.0–3.0)
Basophils Absolute: 0.1 10*3/uL (ref 0.0–0.1)
EOS PCT: 6.4 % — AB (ref 0.0–5.0)
Eosinophils Absolute: 0.3 10*3/uL (ref 0.0–0.7)
HCT: 40.9 % (ref 36.0–46.0)
HEMOGLOBIN: 13.6 g/dL (ref 12.0–15.0)
Lymphocytes Relative: 42.4 % (ref 12.0–46.0)
Lymphs Abs: 2.1 10*3/uL (ref 0.7–4.0)
MCHC: 33.2 g/dL (ref 30.0–36.0)
MCV: 91.4 fl (ref 78.0–100.0)
Monocytes Absolute: 0.4 10*3/uL (ref 0.1–1.0)
Monocytes Relative: 8.9 % (ref 3.0–12.0)
NEUTROS ABS: 2 10*3/uL (ref 1.4–7.7)
NEUTROS PCT: 39.8 % — AB (ref 43.0–77.0)
Platelets: 336 10*3/uL (ref 150.0–400.0)
RBC: 4.47 Mil/uL (ref 3.87–5.11)
RDW: 12.6 % (ref 11.5–15.5)
WBC: 5 10*3/uL (ref 4.0–10.5)

## 2017-02-20 LAB — COMPREHENSIVE METABOLIC PANEL
ALBUMIN: 3.8 g/dL (ref 3.5–5.2)
ALK PHOS: 83 U/L (ref 39–117)
ALT: 15 U/L (ref 0–35)
AST: 20 U/L (ref 0–37)
BUN: 19 mg/dL (ref 6–23)
CHLORIDE: 101 meq/L (ref 96–112)
CO2: 30 mEq/L (ref 19–32)
Calcium: 9.4 mg/dL (ref 8.4–10.5)
Creatinine, Ser: 0.95 mg/dL (ref 0.40–1.20)
GFR: 61.46 mL/min (ref >60.00)
Glucose, Bld: 99 mg/dL (ref 70–99)
POTASSIUM: 3.7 meq/L (ref 3.5–5.1)
SODIUM: 136 meq/L (ref 135–145)
TOTAL PROTEIN: 7.6 g/dL (ref 6.0–8.3)
Total Bilirubin: 0.3 mg/dL (ref 0.2–1.2)

## 2017-02-20 LAB — LIPID PANEL
CHOLESTEROL: 213 mg/dL — AB (ref 0–200)
HDL: 56.9 mg/dL (ref >39.00)
LDL CALC: 131 mg/dL — AB (ref 0–99)
NonHDL: 156.38
Total CHOL/HDL Ratio: 4
Triglycerides: 129 mg/dL (ref 0.0–149.0)
VLDL: 25.8 mg/dL (ref 0.0–40.0)

## 2017-02-20 LAB — TSH: TSH: 1.22 u[IU]/mL (ref 0.35–4.50)

## 2017-02-20 LAB — URINE CULTURE: Organism ID, Bacteria: NO GROWTH

## 2017-02-22 DIAGNOSIS — J3089 Other allergic rhinitis: Secondary | ICD-10-CM | POA: Diagnosis not present

## 2017-02-28 ENCOUNTER — Telehealth: Payer: Self-pay

## 2017-02-28 NOTE — Telephone Encounter (Signed)
Pt had been taking the valsartan without any problem; last weekend pt began with lightheadedness. Pt has stopped valsartan and is starting the micardis. Pt wanted Dr Glori Bickers to know. FYI to Dr Glori Bickers.

## 2017-02-28 NOTE — Telephone Encounter (Signed)
Thanks for the heads up  Continue with the miacardis and alert me if any problems

## 2017-03-02 DIAGNOSIS — J3089 Other allergic rhinitis: Secondary | ICD-10-CM | POA: Diagnosis not present

## 2017-03-09 DIAGNOSIS — J3089 Other allergic rhinitis: Secondary | ICD-10-CM | POA: Diagnosis not present

## 2017-03-15 NOTE — Telephone Encounter (Signed)
Stop the miacardis and see how she feels - let it get out of her system for a week - then call and update with how she is feeling , then we can start something else  Please remove from med list and add to allergy list

## 2017-03-15 NOTE — Telephone Encounter (Signed)
Pt is in [place] due to death in her family; pt has been taking Micardis and on and off since started Micardis pt has had lightheadedness, shakiness and nervous with fast heart beat; pt is not sure how fast her heartbeat is when this happens but only last for few mins. Each time. No CP,SOB or H/A. Pt request cb with what to do. Pt does not have way to ck BP and P now since out of town. If pt needs new med pt will have info for pharmacy there if needed.

## 2017-03-15 NOTE — Addendum Note (Signed)
Addended by: Tammi Sou on: 03/15/2017 03:19 PM   Modules accepted: Orders

## 2017-03-15 NOTE — Telephone Encounter (Signed)
Pt notified of Dr. Marliss Coots instructions and verbalized understanding. Pt will update [place] next week and med removed from med list and added to allergy list

## 2017-03-19 ENCOUNTER — Ambulatory Visit: Payer: Self-pay | Admitting: Obstetrics and Gynecology

## 2017-03-20 DIAGNOSIS — R42 Dizziness and giddiness: Secondary | ICD-10-CM | POA: Diagnosis not present

## 2017-03-20 DIAGNOSIS — J301 Allergic rhinitis due to pollen: Secondary | ICD-10-CM | POA: Diagnosis not present

## 2017-03-20 DIAGNOSIS — J329 Chronic sinusitis, unspecified: Secondary | ICD-10-CM | POA: Diagnosis not present

## 2017-03-20 DIAGNOSIS — R430 Anosmia: Secondary | ICD-10-CM | POA: Diagnosis not present

## 2017-03-20 DIAGNOSIS — J339 Nasal polyp, unspecified: Secondary | ICD-10-CM | POA: Diagnosis not present

## 2017-03-30 DIAGNOSIS — J3089 Other allergic rhinitis: Secondary | ICD-10-CM | POA: Diagnosis not present

## 2017-04-02 NOTE — Telephone Encounter (Signed)
Those bp readings sound fine off the miacardis-stay off of it and continue other medicines  (please take off med list if it is on )  Keep watching bp  If lightheadedness continues despite nl bp- it may be a good idea to f/u with ENT

## 2017-04-02 NOTE — Telephone Encounter (Signed)
Pt called back to update after stopping miacardis;lightheadedness still comes and goes. Pt has seen Dr Charolett Bumpers for polyps in sinus. Pt not sure why still has ligihtheadedness on and off.Pt has taken BP when lightheaded and BP goes up to 132/80. Usually 117/76 to 130/80. Pt request cb with what to do.[facility]. If needs to start at [facility].

## 2017-04-02 NOTE — Telephone Encounter (Signed)
Pt notified of Dr. Marliss Coots comments and instructions.   Pt said she f/u with her ENT a few weeks ago and he advise her that her polyps have returned in her nasal cavity. He gave her a prednisone taper which helped her taste return but she is still experiencing dizziness. She has a f/u with him in 2 weeks so she will see what Dr. Dionicio Stall (ENT) says.   Pt did want me to let Dr. Glori Bickers know that she has been dealing with back issues as well and she has seen Dr. Mertie Moores and he did an MRI and xrays of her back, she ? If due to nerve issues does Dr. Glori Bickers think her dizziness could be related to that as well, if possible she wanted Dr. Glori Bickers to look at the MRI and xrays and let her know what she thinks of them

## 2017-04-02 NOTE — Telephone Encounter (Signed)
I do not think that back issues would add to dizziness- that is a good question  If ENT cannot help we could discuss a neurology appt  I hope she feels better soon

## 2017-04-03 NOTE — Telephone Encounter (Signed)
Pt advise of Dr. Marliss Coots comments and verbalized understanding

## 2017-04-06 DIAGNOSIS — J3089 Other allergic rhinitis: Secondary | ICD-10-CM | POA: Diagnosis not present

## 2017-04-10 DIAGNOSIS — T39015A Adverse effect of aspirin, initial encounter: Secondary | ICD-10-CM | POA: Diagnosis not present

## 2017-04-10 DIAGNOSIS — J339 Nasal polyp, unspecified: Secondary | ICD-10-CM | POA: Diagnosis not present

## 2017-04-10 DIAGNOSIS — R430 Anosmia: Secondary | ICD-10-CM | POA: Diagnosis not present

## 2017-04-10 DIAGNOSIS — J301 Allergic rhinitis due to pollen: Secondary | ICD-10-CM | POA: Diagnosis not present

## 2017-04-13 DIAGNOSIS — J3089 Other allergic rhinitis: Secondary | ICD-10-CM | POA: Diagnosis not present

## 2017-04-17 ENCOUNTER — Ambulatory Visit (INDEPENDENT_AMBULATORY_CARE_PROVIDER_SITE_OTHER): Payer: Medicare Other | Admitting: Obstetrics and Gynecology

## 2017-04-17 ENCOUNTER — Encounter: Payer: Self-pay | Admitting: Obstetrics and Gynecology

## 2017-04-17 VITALS — BP 132/82 | HR 68 | Ht 61.5 in | Wt 149.0 lb

## 2017-04-17 DIAGNOSIS — R1030 Lower abdominal pain, unspecified: Secondary | ICD-10-CM

## 2017-04-17 DIAGNOSIS — Z01419 Encounter for gynecological examination (general) (routine) without abnormal findings: Secondary | ICD-10-CM | POA: Diagnosis not present

## 2017-04-17 DIAGNOSIS — Z124 Encounter for screening for malignant neoplasm of cervix: Secondary | ICD-10-CM

## 2017-04-17 LAB — POCT URINALYSIS DIPSTICK
Bilirubin, UA: NEGATIVE
Glucose, UA: NEGATIVE
KETONES UA: NEGATIVE
Nitrite, UA: NEGATIVE
Protein, UA: NEGATIVE
RBC UA: NEGATIVE
SPEC GRAV UA: 1.01 (ref 1.010–1.025)
UROBILINOGEN UA: 0.2 U/dL
pH, UA: 5 (ref 5.0–8.0)

## 2017-04-17 NOTE — Progress Notes (Signed)
PCP: Abner Greenspan, MD   Chief Complaint  Patient presents with  . Gynecologic Exam    HPI:      [name] is a 71 y.o. No obstetric history on file. who LMP was No LMP recorded. Patient has had a hysterectomy., presents today for her annual examination.  Her menses are absent due to menopause. She does not have intermenstrual bleeding.  She does not have vasomotor sx.  Sex activity: not sexually active. She does not have vaginal dryness.  Last Pap: January 20, 2015  Results were: no abnormalities  Hx of STDs: none  Last mammogram: August 08, 2016  Results were: normal--routine follow-up in 12 months There is no FH of breast cancer. There is no FH of ovarian cancer. The patient does do self-breast exams.  Colonoscopy: colonoscopy 5 years ago without abnormalities. . Repeat due after 5 years. Has sched 12/18. DEXA: 2018 with PCP  Tobacco use: The patient denies current or previous tobacco use. Alcohol use: none Exercise: moderately active  She does get adequate calcium and Vitamin D in her diet.  Labs with PCP.  Past Medical History:  Diagnosis Date  . Allergic rhinitis   . Bronchitis    more frequent in the past  . Colon polyps    history of  . GERD (gastroesophageal reflux disease)   . Glaucoma    suspected  . Heart murmur   . Hyperlipidemia   . Hypertension   . Osteoarthritis of knee   . Osteopenia     Past Surgical History:  Procedure Laterality Date  . BUNIONECTOMY     bilateral-PIN LEFT GREAT TOE  . CARPAL TUNNEL RELEASE    . FUNCTIONAL ENDOSCOPIC SINUS SURGERY     05-04-2011  . KNEE ARTHROSCOPY     right  . TUBAL LIGATION     bilateral  . VEIN SURGERY  2006   legs    Family History  Problem Relation Age of Onset  . Cancer Mother        colon, lungs, liver  . Colon cancer Mother 35  . Cancer Father        prostate  . Heart disease Father        A-fib  . Stroke Father        TIA's  . Alcohol abuse Brother   . Cancer Brother          pancreatic, smoker  . Diabetes Brother   . Diabetes Brother   . Stomach cancer Neg Hx     Social History   Social History  . Marital status: Widowed    Spouse name: N/A  . Number of children: N/A  . Years of education: N/A   Occupational History  . [employer]     over 30 years   Social History Main Topics  . Smoking status: Never Smoker  . Smokeless tobacco: Never Used     Comment: used to have secondary smoke exposure from husband  . Alcohol use No  . Drug use: No  . Sexual activity: No   Other Topics Concern  . Not on file   Social History Narrative   Teaches arobics, takes spinning class.  Plans to retire from [place] in next several years.  Lives with husband.    No outpatient prescriptions have been marked as taking for the 04/17/17 encounter (Office Visit) with Copland, Alicia B, PA-C.      ROS:  Review of Systems  Constitutional: Negative for fatigue,  fever and unexpected weight change.  Respiratory: Negative for cough, shortness of breath and wheezing.   Cardiovascular: Negative for chest pain, palpitations and leg swelling.  Gastrointestinal: Negative for blood in stool, constipation, diarrhea, nausea and vomiting.  Endocrine: Negative for cold intolerance, heat intolerance and polyuria.  Genitourinary: Negative for dyspareunia, dysuria, flank pain, frequency, genital sores, hematuria, menstrual problem, pelvic pain, urgency, vaginal bleeding, vaginal discharge and vaginal pain.  Musculoskeletal: Negative for back pain, joint swelling and myalgias.  Skin: Negative for rash.  Neurological: Negative for dizziness, syncope, light-headedness, numbness and headaches.  Hematological: Negative for adenopathy.  Psychiatric/Behavioral: Negative for agitation, confusion, sleep disturbance and suicidal ideas. The patient is not nervous/anxious.      Objective: BP 132/82   Pulse 68   Ht 5' 1.5" (1.562 m)   Wt 149 lb (67.6 kg)   BMI 27.70 kg/m     Physical Exam  Constitutional: She is oriented to person, place, and time. She appears well-developed and well-nourished.  Genitourinary: Vagina normal and uterus normal. There is no rash or tenderness on the right labia. There is no rash or tenderness on the left labia. No erythema or tenderness in the vagina. No vaginal discharge found. Right adnexum does not display mass and does not display tenderness. Left adnexum does not display mass and does not display tenderness. Cervix does not exhibit motion tenderness or polyp. Uterus is not enlarged or tender.  Neck: Normal range of motion. No thyromegaly present.  Cardiovascular: Normal rate, regular rhythm and normal heart sounds.   No murmur heard. Pulmonary/Chest: Effort normal and breath sounds normal. Right breast exhibits no mass, no nipple discharge, no skin change and no tenderness. Left breast exhibits no mass, no nipple discharge, no skin change and no tenderness.  Abdominal: Soft. There is no tenderness. There is no guarding.  Musculoskeletal: Normal range of motion.  Neurological: She is alert and oriented to person, place, and time. No cranial nerve deficit.  Psychiatric: She has a normal mood and affect. Her behavior is normal.  Vitals reviewed.   Results: Results for orders placed or performed in visit on 04/17/17 (from the past 24 hour(s))  POCT Urinalysis Dipstick     Status: Abnormal   Collection Time: 04/17/17 11:39 AM  Result Value Ref Range   Color, UA pale yellow    Clarity, UA clear    Glucose, UA negative    Bilirubin, UA negative    Ketones, UA negative    Spec Grav, UA 1.010 1.010 - 1.025   Blood, UA negative    pH, UA 5.0 5.0 - 8.0   Protein, UA negative    Urobilinogen, UA 0.2 0.2 or 1.0 E.U./dL   Nitrite, UA negative    Leukocytes, UA Large (3+) (A) Negative    Assessment/Plan:  Encounter for annual routine gynecological examination  Cervical cancer screening - Plan: Pap IG (Image Guided)  Lower  abdominal pain - Pos leuks. Check C&S. Will f/u if pos.  - Plan: POCT Urinalysis Dipstick, Urine Culture        GYN counsel mammography screening, menopause, adequate intake of calcium and vitamin D, diet and exercise    F/U  Return in about 2 years (around 04/18/2019) for Medicare breast/pelvic.  Alicia B. Copland, PA-C 04/17/2017 11:58 AM

## 2017-04-19 LAB — PAP IG (IMAGE GUIDED): PAP SMEAR COMMENT: 0

## 2017-04-19 LAB — URINE CULTURE

## 2017-04-27 DIAGNOSIS — J3089 Other allergic rhinitis: Secondary | ICD-10-CM | POA: Diagnosis not present

## 2017-05-01 DIAGNOSIS — J3089 Other allergic rhinitis: Secondary | ICD-10-CM | POA: Diagnosis not present

## 2017-05-18 DIAGNOSIS — J3089 Other allergic rhinitis: Secondary | ICD-10-CM | POA: Diagnosis not present

## 2017-05-18 DIAGNOSIS — J301 Allergic rhinitis due to pollen: Secondary | ICD-10-CM | POA: Diagnosis not present

## 2017-05-28 ENCOUNTER — Encounter: Payer: Self-pay | Admitting: Gastroenterology

## 2017-06-01 DIAGNOSIS — J3089 Other allergic rhinitis: Secondary | ICD-10-CM | POA: Diagnosis not present

## 2017-07-02 DIAGNOSIS — H401131 Primary open-angle glaucoma, bilateral, mild stage: Secondary | ICD-10-CM | POA: Diagnosis not present

## 2017-07-05 DIAGNOSIS — J3089 Other allergic rhinitis: Secondary | ICD-10-CM | POA: Diagnosis not present

## 2017-07-06 ENCOUNTER — Telehealth: Payer: Self-pay | Admitting: *Deleted

## 2017-07-06 NOTE — Telephone Encounter (Signed)
Copied from [place]. Topic: General - Other >> Jul 06, 2017  4:53 PM Vernona Rieger wrote: Pt called to say she got her flu shot in October. Message is for Jaana Brodt & wants to know when she can get her tetanus shot. Her colonoscopy is sch for 1/15.  Call patient back at [phone]

## 2017-07-06 NOTE — Telephone Encounter (Signed)
Patient advised.

## 2017-07-06 NOTE — Telephone Encounter (Signed)
Any time is fine  She will likely have to go to a pharmacy or health dept since she has medicare

## 2017-07-10 DIAGNOSIS — J3089 Other allergic rhinitis: Secondary | ICD-10-CM | POA: Diagnosis not present

## 2017-07-17 DIAGNOSIS — J3089 Other allergic rhinitis: Secondary | ICD-10-CM | POA: Diagnosis not present

## 2017-07-23 ENCOUNTER — Telehealth: Payer: Self-pay | Admitting: *Deleted

## 2017-07-23 ENCOUNTER — Other Ambulatory Visit: Payer: Self-pay

## 2017-07-23 ENCOUNTER — Ambulatory Visit (AMBULATORY_SURGERY_CENTER): Payer: Self-pay | Admitting: *Deleted

## 2017-07-23 VITALS — Ht 61.5 in | Wt 149.0 lb

## 2017-07-23 DIAGNOSIS — Z86010 Personal history of colonic polyps: Secondary | ICD-10-CM

## 2017-07-23 NOTE — Telephone Encounter (Signed)
Dr. Silverio Decamp,  Could this pt have either Reglan or Zofran to take before her prep?  Thanks, [employer]

## 2017-07-23 NOTE — Progress Notes (Signed)
No egg or soy allergy  No home oxygen used or hx of sleep apnea  No intubation or anesthesia problems per pt  No diet medications taken  Pt is questioning the use of Miralax for her prep.  She states, "I took Moviprep last time.  I don't want to pay much for my prep but call the pharmacy to see the prices of prep."  I phoned her Planada is $42.00, Moviprep and Plenvu aren't covered by insurance.  She states she "will only take Miralax then, since it's the cheapest."

## 2017-07-24 DIAGNOSIS — J3089 Other allergic rhinitis: Secondary | ICD-10-CM | POA: Diagnosis not present

## 2017-07-24 DIAGNOSIS — Z23 Encounter for immunization: Secondary | ICD-10-CM | POA: Diagnosis not present

## 2017-07-24 NOTE — Telephone Encounter (Signed)
Reglan 5mg  Q8h prn X1 day. Thanks

## 2017-07-25 MED ORDER — METOCLOPRAMIDE HCL 5 MG PO TABS: 5.0000 mg | ORAL_TABLET | ORAL | 0 refills | Status: DC

## 2017-07-25 NOTE — Telephone Encounter (Signed)
Called in reglan- called pt and gave instructions for Reglan 20-30 monutes before each prep half  [facility]

## 2017-07-31 ENCOUNTER — Telehealth: Payer: Self-pay | Admitting: Gastroenterology

## 2017-07-31 NOTE — Telephone Encounter (Signed)
RN told pt OK for allergy shot today.  Then notified MD in case of different instructions. Thurston Pounds RN

## 2017-08-01 ENCOUNTER — Ambulatory Visit (AMBULATORY_SURGERY_CENTER): Payer: Medicare Other | Admitting: Gastroenterology

## 2017-08-01 ENCOUNTER — Other Ambulatory Visit: Payer: Self-pay

## 2017-08-01 ENCOUNTER — Encounter: Payer: Self-pay | Admitting: Gastroenterology

## 2017-08-01 VITALS — BP 109/48 | HR 67 | Temp 98.2°F | Resp 11 | Ht 61.5 in | Wt 149.0 lb

## 2017-08-01 DIAGNOSIS — Z86010 Personal history of colonic polyps: Secondary | ICD-10-CM | POA: Diagnosis not present

## 2017-08-01 DIAGNOSIS — K219 Gastro-esophageal reflux disease without esophagitis: Secondary | ICD-10-CM | POA: Diagnosis not present

## 2017-08-01 DIAGNOSIS — E669 Obesity, unspecified: Secondary | ICD-10-CM | POA: Diagnosis not present

## 2017-08-01 DIAGNOSIS — I10 Essential (primary) hypertension: Secondary | ICD-10-CM | POA: Diagnosis not present

## 2017-08-01 MED ORDER — SODIUM CHLORIDE 0.9 % IV SOLN: 500.0000 mL | Freq: Once | INTRAVENOUS | Status: DC

## 2017-08-01 NOTE — Patient Instructions (Signed)
YOU HAD AN ENDOSCOPIC PROCEDURE TODAY AT THE [facility]:   Refer to the procedure report that was given to you for any specific questions about what was found during the examination.  If the procedure report does not answer your questions, please call your gastroenterologist to clarify.  If you requested that your care partner not be given the details of your procedure findings, then the procedure report has been included in a sealed envelope for you to review at your convenience later.  YOU SHOULD EXPECT: Some feelings of bloating in the abdomen. Passage of more gas than usual.  Walking can help get rid of the air that was put into your GI tract during the procedure and reduce the bloating. If you had a lower endoscopy (such as a colonoscopy or flexible sigmoidoscopy) you may notice spotting of blood in your stool or on the toilet paper. If you underwent a bowel prep for your procedure, you may not have a normal bowel movement for a few days.  Please Note:  You might notice some irritation and congestion in your nose or some drainage.  This is from the oxygen used during your procedure.  There is no need for concern and it should clear up in a day or so.  SYMPTOMS TO REPORT IMMEDIATELY:   Following lower endoscopy (colonoscopy or flexible sigmoidoscopy):  Excessive amounts of blood in the stool  Significant tenderness or worsening of abdominal pains  Swelling of the abdomen that is new, acute  Fever of 100F or higher  For urgent or emergent issues, a gastroenterologist can be reached at any hour by calling [phone].   DIET:  We do recommend a small meal at first, but then you may proceed to your regular diet.  Drink plenty of fluids but you should avoid alcoholic beverages for 24 hours.  ACTIVITY:  You should plan to take it easy for the rest of today and you should NOT DRIVE or use heavy machinery until tomorrow (because of the sedation medicines used during the test).     FOLLOW UP: Our staff will call the number listed on your records the next business day following your procedure to check on you and address any questions or concerns that you may have regarding the information given to you following your procedure. If we do not reach you, we will leave a message.  However, if you are feeling well and you are not experiencing any problems, there is no need to return our call.  We will assume that you have returned to your regular daily activities without incident.  If any biopsies were taken you will be contacted by phone or by letter within the next 1-3 weeks.  Please call us at [phone] if you have not heard about the biopsies in 3 weeks.   No repeat Colonoscopy due to age Diverticulosis (handout given) Hemorrhoids (handout given)  SIGNATURES/CONFIDENTIALITY: You and/or your care partner have signed paperwork which will be entered into your electronic medical record.  These signatures attest to the fact that that the information above on your After Visit Summary has been reviewed and is understood.  Full responsibility of the confidentiality of this discharge information lies with you and/or your care-partner. 

## 2017-08-01 NOTE — Progress Notes (Signed)
Pt's states no medical or surgical changes since previsit or office visit. 

## 2017-08-01 NOTE — Op Note (Signed)
[place] Patient Name: Amy Herman Procedure Date: 08/01/2017 11:40 AM MRN: [id number] Endoscopist: Mauri Pole , MD Age: 72 Referring MD:  Date of Birth: 10/24/1945 Gender: Female Account #: [id number] Procedure:                Colonoscopy Indications:              Surveillance: Personal history of adenomatous                            polyps on last colonoscopy > 5 years ago, High risk                            colon cancer surveillance: Personal history of                            adenoma less than 10 mm in size Medicines:                Monitored Anesthesia Care Procedure:                Pre-Anesthesia Assessment:                           - Prior to the procedure, a History and Physical                            was performed, and patient medications and                            allergies were reviewed. The patient's tolerance of                            previous anesthesia was also reviewed. The risks                            and benefits of the procedure and the sedation                            options and risks were discussed with the patient.                            All questions were answered, and informed consent                            was obtained. Prior Anticoagulants: The patient has                            taken no previous anticoagulant or antiplatelet                            agents. ASA Grade Assessment: II - A patient with                            mild systemic disease. After reviewing the risks  and benefits, the patient was deemed in                            satisfactory condition to undergo the procedure.                           After obtaining informed consent, the colonoscope                            was passed under direct vision. Throughout the                            procedure, the patient's blood pressure, pulse, and                            oxygen saturations were  monitored continuously. The                            Colonoscope was introduced through the anus and                            advanced to the the cecum, identified by                            appendiceal orifice and ileocecal valve. The                            colonoscopy was performed without difficulty. The                            patient tolerated the procedure well. The quality                            of the bowel preparation was good. The ileocecal                            valve, appendiceal orifice, and rectum were                            photographed. Scope In: 11:49:09 AM Scope Out: 12:01:45 PM Scope Withdrawal Time: 0 hours 8 minutes 14 seconds  Total Procedure Duration: 0 hours 12 minutes 36 seconds  Findings:                 The perianal and digital rectal examinations were                            normal.                           Multiple small and large-mouthed diverticula were                            found in the sigmoid colon.  Non-bleeding internal hemorrhoids were found during                            retroflexion. The hemorrhoids were small.                           The exam was otherwise without abnormality. Complications:            No immediate complications. Estimated Blood Loss:     Estimated blood loss: none. Impression:               - Diverticulosis in the sigmoid colon.                           - Non-bleeding internal hemorrhoids.                           - The examination was otherwise normal.                           - No specimens collected. Recommendation:           - Patient has a contact number available for                            emergencies. The signs and symptoms of potential                            delayed complications were discussed with the                            patient. Return to normal activities tomorrow.                            Written discharge instructions were provided  to the                            patient.                           - Resume previous diet.                           - Continue present medications.                           - No repeat colonoscopy due to age. Mauri Pole, MD 08/01/2017 12:05:48 PM This report has been signed electronically.

## 2017-08-01 NOTE — Progress Notes (Signed)
Report to PACU, RN, vss, BBS= Clear.  

## 2017-08-02 ENCOUNTER — Telehealth: Payer: Self-pay | Admitting: *Deleted

## 2017-08-02 DIAGNOSIS — J3089 Other allergic rhinitis: Secondary | ICD-10-CM | POA: Diagnosis not present

## 2017-08-02 NOTE — Telephone Encounter (Signed)
  Follow up Call-  Call back number 08/01/2017  Post procedure Call Back phone  # [phone]  Permission to leave phone message Yes  Some recent data might be hidden     Patient questions:  Do you have a fever, pain , or abdominal swelling? No. Pain Score  0 *  Have you tolerated food without any problems? Yes.    Have you been able to return to your normal activities? Yes.    Do you have any questions about your discharge instructions: Diet   No. Medications  No. Follow up visit  No.  Do you have questions or concerns about your Care? No.  Actions: * If pain score is 4 or above: No action needed, pain <4.

## 2017-08-07 DIAGNOSIS — J3089 Other allergic rhinitis: Secondary | ICD-10-CM | POA: Diagnosis not present

## 2017-08-08 ENCOUNTER — Other Ambulatory Visit: Payer: Self-pay | Admitting: Family Medicine

## 2017-08-08 DIAGNOSIS — Z1231 Encounter for screening mammogram for malignant neoplasm of breast: Secondary | ICD-10-CM

## 2017-08-14 DIAGNOSIS — J3089 Other allergic rhinitis: Secondary | ICD-10-CM | POA: Diagnosis not present

## 2017-08-21 DIAGNOSIS — J3089 Other allergic rhinitis: Secondary | ICD-10-CM | POA: Diagnosis not present

## 2017-08-22 ENCOUNTER — Ambulatory Visit
Admission: RE | Admit: 2017-08-22 | Discharge: 2017-08-22 | Disposition: A | Discharge status: Home / Self Care | Payer: Medicare Other | Source: Ambulatory Visit | Attending: Family Medicine | Admitting: Family Medicine

## 2017-08-22 DIAGNOSIS — Z1231 Encounter for screening mammogram for malignant neoplasm of breast: Secondary | ICD-10-CM | POA: Diagnosis not present

## 2017-08-28 DIAGNOSIS — J3089 Other allergic rhinitis: Secondary | ICD-10-CM | POA: Diagnosis not present

## 2017-09-04 DIAGNOSIS — J3089 Other allergic rhinitis: Secondary | ICD-10-CM | POA: Diagnosis not present

## 2017-09-11 DIAGNOSIS — J3089 Other allergic rhinitis: Secondary | ICD-10-CM | POA: Diagnosis not present

## 2017-09-12 DIAGNOSIS — N898 Other specified noninflammatory disorders of vagina: Secondary | ICD-10-CM | POA: Diagnosis not present

## 2017-09-18 DIAGNOSIS — J3089 Other allergic rhinitis: Secondary | ICD-10-CM | POA: Diagnosis not present

## 2017-09-25 DIAGNOSIS — J3089 Other allergic rhinitis: Secondary | ICD-10-CM | POA: Diagnosis not present

## 2017-09-26 ENCOUNTER — Telehealth: Payer: Self-pay

## 2017-09-26 NOTE — Telephone Encounter (Signed)
Copied from [place]. Topic: Inquiry >> Sep 26, 2017  4:39 PM [name] wrote: Reason for CRM: pt called to state that she got her flu shot last year in Oct, @ [facility] w/ her employer at the time; pt would like to know if she can take BIOTIN(gummies) w/ her other medications

## 2017-09-27 NOTE — Telephone Encounter (Signed)
That is fine Add to med list please  We need to be aware -if she ever needs thyroid labs-biotin can affect that  If it does not help hair/skin in 3 months- stop it (not worth the $)

## 2017-09-27 NOTE — Telephone Encounter (Signed)
Chart updated with flu shot, Dr. Glori Bickers please see prev note regarding biotin

## 2017-09-27 NOTE — Telephone Encounter (Signed)
Left VM letting pt know Dr. Tower's comments  

## 2017-10-02 DIAGNOSIS — J3089 Other allergic rhinitis: Secondary | ICD-10-CM | POA: Diagnosis not present

## 2017-10-09 DIAGNOSIS — J3089 Other allergic rhinitis: Secondary | ICD-10-CM | POA: Diagnosis not present

## 2017-10-18 DIAGNOSIS — J3089 Other allergic rhinitis: Secondary | ICD-10-CM | POA: Diagnosis not present

## 2017-10-23 DIAGNOSIS — J3089 Other allergic rhinitis: Secondary | ICD-10-CM | POA: Diagnosis not present

## 2017-10-26 DIAGNOSIS — J019 Acute sinusitis, unspecified: Secondary | ICD-10-CM | POA: Diagnosis not present

## 2017-10-26 DIAGNOSIS — B373 Candidiasis of vulva and vagina: Secondary | ICD-10-CM | POA: Diagnosis not present

## 2017-10-26 DIAGNOSIS — N390 Urinary tract infection, site not specified: Secondary | ICD-10-CM | POA: Diagnosis not present

## 2017-10-26 DIAGNOSIS — B9689 Other specified bacterial agents as the cause of diseases classified elsewhere: Secondary | ICD-10-CM | POA: Diagnosis not present

## 2017-10-30 DIAGNOSIS — J3089 Other allergic rhinitis: Secondary | ICD-10-CM | POA: Diagnosis not present

## 2017-11-07 ENCOUNTER — Telehealth: Payer: Self-pay

## 2017-11-07 DIAGNOSIS — R399 Unspecified symptoms and signs involving the genitourinary system: Secondary | ICD-10-CM | POA: Diagnosis not present

## 2017-11-07 NOTE — Telephone Encounter (Signed)
Copied from [place] [id number]. Topic: Appointment Scheduling - Scheduling Inquiry for Clinic >> Nov 07, 2017  4:43 PM Arletha Grippe wrote: Reason for CRM: pt would like to be worked in on Friday to discuss recurring uti . She wan to [place] and finished abx and it didn't clear up all the way.  Cb is [phone] She is off on Friday and wants to be seen that day

## 2017-11-07 NOTE — Telephone Encounter (Signed)
Any of my blocks on Friday are fine to put her in  Thanks

## 2017-11-08 DIAGNOSIS — R05 Cough: Secondary | ICD-10-CM | POA: Diagnosis not present

## 2017-11-08 DIAGNOSIS — J3089 Other allergic rhinitis: Secondary | ICD-10-CM | POA: Diagnosis not present

## 2017-11-08 DIAGNOSIS — J301 Allergic rhinitis due to pollen: Secondary | ICD-10-CM | POA: Diagnosis not present

## 2017-11-08 NOTE — Telephone Encounter (Signed)
appt scheduled tomorrow in a blocked spot

## 2017-11-09 ENCOUNTER — Encounter: Payer: Self-pay | Admitting: Family Medicine

## 2017-11-09 ENCOUNTER — Ambulatory Visit (INDEPENDENT_AMBULATORY_CARE_PROVIDER_SITE_OTHER): Payer: Medicare Other | Admitting: Family Medicine

## 2017-11-09 VITALS — BP 136/74 | HR 79 | Temp 98.5°F | Ht 61.5 in | Wt 145.5 lb

## 2017-11-09 DIAGNOSIS — Z87440 Personal history of urinary (tract) infections: Secondary | ICD-10-CM | POA: Diagnosis not present

## 2017-11-09 DIAGNOSIS — N898 Other specified noninflammatory disorders of vagina: Secondary | ICD-10-CM

## 2017-11-09 LAB — POC URINALSYSI DIPSTICK (AUTOMATED)
BILIRUBIN UA: NEGATIVE
Glucose, UA: NEGATIVE
Ketones, UA: NEGATIVE
LEUKOCYTES UA: NEGATIVE
Nitrite, UA: NEGATIVE
PH UA: 7 (ref 5.0–8.0)
Protein, UA: NEGATIVE
RBC UA: NEGATIVE
SPEC GRAV UA: 1.015 (ref 1.010–1.025)
Urobilinogen, UA: 0.2 E.U./dL

## 2017-11-09 NOTE — Patient Instructions (Addendum)
Urine is clear today   Your gyn evaluation is re assuring  Your vaginal discharge may be changing with age/ due to [place] changes  A pro biotic or eating yogurt may help   Keep taking good care of yourself

## 2017-11-09 NOTE — Progress Notes (Signed)
Subjective:    Patient ID: Amy Herman, female    DOB: [birth year], 72 y.o.   MRN: [id number]  HPI Here for urinary symptoms   Wt Readings from Last 3 Encounters:  11/09/17 145 lb 8 oz (66 kg)  08/01/17 149 lb (67.6 kg)  07/23/17 149 lb (67.6 kg)   27.05 kg/m   She was seen at [facility] clinic 4/26 Had UA with LE and wbc and bacteria  Culture showed mixed urogenital flora  Finished abx - augmentin on monday  Then on 5/8 ua was fairly clear with small LE and rare bact   UA today Results for orders placed or performed in visit on 11/09/17  POCT Urinalysis Dipstick (Automated)  Result Value Ref Range   Color, UA Light Yellow    Clarity, UA Clear    Glucose, UA Negative    Bilirubin, UA Negative    Ketones, UA Negative    Spec Grav, UA 1.015 1.010 - 1.025   Blood, UA Negative    pH, UA 7.0 5.0 - 8.0   Protein, UA Negative    Urobilinogen, UA 0.2 0.2 or 1.0 E.U./dL   Nitrite, UA Negative    Leukocytes, UA Negative Negative   looks clear   Sometimes she has a vaginal d/c  Saw gyn- nl exam and wet prep  Some vaginal atrophy  No chance of STD at all -no sexual activity   Exercises a lot -enjoys that   Patient Active Problem List   Diagnosis Date Noted  . Vaginal discharge 11/09/2017  . Right elbow pain 02/19/2017  . Encounter for screening mammogram for breast cancer 05/08/2016  . Estrogen deficiency 02/16/2016  . Contact with or exposure to communicable disease 02/16/2016  . Pre-operative cardiovascular examination 07/21/2013  . GERD (gastroesophageal reflux disease) 06/18/2013  . Varicose veins of lower extremities with other complications 61/95/0932  . Lumbar disc disease 08/15/2011  . Joint pain 08/15/2011  . Osteopenia 01/31/2011  . Post-menopausal 01/31/2011  . Hypokalemia 01/31/2011  . Hyperlipidemia 03/24/2008  . ESSENTIAL HYPERTENSION, BENIGN 03/24/2008  . COLONIC POLYPS, HX OF 03/24/2008   Past Medical History:  Diagnosis Date  . Allergic  rhinitis   . Allergy   . Bronchitis    more frequent in the past  . Cataract    "suspect per pt"  . Colon polyps    history of  . GERD (gastroesophageal reflux disease)   . Glaucoma    suspected  . Heart murmur   . Hyperlipidemia   . Hypertension   . Osteoarthritis of knee   . Osteopenia    Past Surgical History:  Procedure Laterality Date  . BUNIONECTOMY     bilateral-PIN LEFT GREAT TOE  . CARPAL TUNNEL RELEASE    . COLONOSCOPY    . FUNCTIONAL ENDOSCOPIC SINUS SURGERY     05-04-2011  . KNEE ARTHROSCOPY     right  . TUBAL LIGATION     bilateral  . VEIN SURGERY  2006   legs   Social History   Tobacco Use  . Smoking status: Never Smoker  . Smokeless tobacco: Never Used  . Tobacco comment: used to have secondary smoke exposure from husband  Substance Use Topics  . Alcohol use: No    Alcohol/week: 0.0 oz  . Drug use: No   Family History  Problem Relation Age of Onset  . Cancer Mother        colon, lungs, liver  . Colon cancer Mother 66  . Cancer  Father        prostate  . Heart disease Father        A-fib  . Stroke Father        TIA's  . Alcohol abuse Brother   . Cancer Brother        pancreatic, smoker  . Diabetes Brother   . Diabetes Brother   . Stomach cancer Neg Hx   . Esophageal cancer Neg Hx   . Rectal cancer Neg Hx   . Breast cancer Neg Hx    Allergies  Allergen Reactions  . Cefuroxime     Other reaction(s): RASH  . Cefuroxime Axetil     REACTION: bumps on tounge  . Cefuroxime Axetil Other (See Comments)    Other reaction(s): RASH Other reaction(s): RASH REACTION: bumps on tounge  . Clarithromycin Other (See Comments)    Other reaction(s): OTHER Other reaction(s): OTHER REACTION: GI Biaxin   . Clarithromycin Other (See Comments)    Burning sensation to stomach  . Levofloxacin   . Levofloxacin Other (See Comments)    Joint pains  . Lipitor [Atorvastatin] Other (See Comments)    Muscle pain  Muscle pain   . Losartan Other (See  Comments)    cough cough  . Other     Other reaction(s): Unknown  . Rosuvastatin Other (See Comments)    REACTION: severe muscle pain REACTION: severe muscle pain  . Valsartan     Taken off from MD d/t a recall   Current Outpatient Medications on File Prior to Visit  Medication Sig Dispense Refill  . ALBUTEROL IN Inhale into the lungs as needed.    . Ascorbic Acid (VITAMIN C) 1000 MG tablet Take by mouth.    Marland Kitchen aspirin 81 MG EC tablet Take 81 mg by mouth daily.      . bimatoprost (LUMIGAN) 0.01 % SOLN     . Bisacodyl (DULCOLAX PO) Take by mouth. For colonoscopy 08-01-17    . calcium citrate-vitamin D (CITRACAL+D) 315-200 MG-UNIT per tablet Take 1 tablet by mouth daily.      . fexofenadine (ALLEGRA) 180 MG tablet Take 180 mg by mouth as needed for allergies or rhinitis.    . Garlic (ODOR FREE GARLIC) 081 MG TABS Take 2 tablets by mouth 2 (two) times daily.     . meloxicam (MOBIC) 15 MG tablet Take 1 tablet by mouth  daily with food for pain 90 tablet 3  . metoCLOPramide (REGLAN) 5 MG tablet Take 1 tablet (5 mg total) by mouth as directed. Take 1 tablet 20-30 minutes before each prep dose 3 tablet 0  . Omega-3 Fatty Acids (OMEGA 3 PO) Take by mouth daily.    Marland Kitchen omeprazole (PRILOSEC) 20 MG capsule Take 20 mg by mouth as needed.    . Polyethylene Glycol 3350 (MIRALAX PO) Take by mouth. For colonoscopy 08-01-17    . potassium chloride (K-DUR) 10 MEQ tablet Take 2 tablets (20 mEq  total) by mouth daily. 180 tablet 3  . telmisartan (MICARDIS) 80 MG tablet Take 80 mg by mouth daily.  3  . Triamcinolone Acetonide (NASACORT AQ NA) Place into the nose daily.    Marland Kitchen triamterene-hydrochlorothiazide (MAXZIDE-25) 37.5-25 MG tablet Take 1 tablet by mouth daily. 90 tablet 3  . vitamin D, CHOLECALCIFEROL, 400 UNITS tablet Take 400 Units by mouth 2 (two) times daily.       Current Facility-Administered Medications on File Prior to Visit  Medication Dose Route Frequency Provider Last Rate Last Dose  .  0.9 %   sodium chloride infusion  500 mL Intravenous Once Nandigam, Kavitha V, MD         Review of Systems  Constitutional: Negative for activity change, appetite change, fatigue, fever and unexpected weight change.  HENT: Negative for congestion, ear pain, rhinorrhea, sinus pressure and sore throat.   Eyes: Negative for pain, redness and visual disturbance.  Respiratory: Negative for cough, shortness of breath and wheezing.   Cardiovascular: Negative for chest pain and palpitations.  Gastrointestinal: Negative for abdominal pain, blood in stool, constipation and diarrhea.  Endocrine: Negative for polydipsia and polyuria.  Genitourinary: Positive for vaginal discharge. Negative for dyspareunia, dysuria, flank pain, frequency, genital sores, hematuria, urgency and vaginal bleeding.  Musculoskeletal: Negative for arthralgias, back pain and myalgias.  Skin: Negative for pallor and rash.  Allergic/Immunologic: Negative for environmental allergies.  Neurological: Negative for dizziness, syncope and headaches.  Hematological: Negative for adenopathy. Does not bruise/bleed easily.  Psychiatric/Behavioral: Negative for decreased concentration and dysphoric mood. The patient is not nervous/anxious.        Objective:   Physical Exam  Constitutional: She appears well-developed and well-nourished. No distress.  Well appearing   HENT:  Head: Normocephalic and atraumatic.  Eyes: Pupils are equal, round, and reactive to light. Conjunctivae and EOM are normal.  Neck: Normal range of motion. Neck supple.  Cardiovascular: Normal rate, regular rhythm and normal heart sounds.  Pulmonary/Chest: Effort normal and breath sounds normal.  Abdominal: Soft. Bowel sounds are normal. She exhibits no distension. There is no tenderness. There is no rebound.  No cva tenderness  no suprapubic tenderness  Musculoskeletal: She exhibits no edema.  Lymphadenopathy:    She has no cervical adenopathy.  Neurological: She is  alert.  Skin: No rash noted.  Psychiatric: She has a normal mood and affect.  pleasant          Assessment & Plan:   Problem List Items Addressed This Visit      Genitourinary   History of UTI - Primary    Rev notes and labs from [facility] clinic This appears to be resolved now  Enc to drink water       Relevant Orders   POCT Urinalysis Dipstick (Automated) (Completed)     Other   Vaginal discharge    On and off  Nl eval from gyn  Alert if worse or change

## 2017-11-11 DIAGNOSIS — Z87440 Personal history of urinary (tract) infections: Secondary | ICD-10-CM | POA: Insufficient documentation

## 2017-11-11 NOTE — Assessment & Plan Note (Signed)
On and off  Nl eval from gyn  Alert if worse or change

## 2017-11-11 NOTE — Assessment & Plan Note (Signed)
Rev notes and labs from [facility] clinic This appears to be resolved now  Enc to drink water

## 2017-11-15 DIAGNOSIS — J3089 Other allergic rhinitis: Secondary | ICD-10-CM | POA: Diagnosis not present

## 2017-11-20 DIAGNOSIS — J3089 Other allergic rhinitis: Secondary | ICD-10-CM | POA: Diagnosis not present

## 2017-11-29 DIAGNOSIS — J3089 Other allergic rhinitis: Secondary | ICD-10-CM | POA: Diagnosis not present

## 2017-12-04 DIAGNOSIS — J3089 Other allergic rhinitis: Secondary | ICD-10-CM | POA: Diagnosis not present

## 2017-12-04 DIAGNOSIS — I83893 Varicose veins of bilateral lower extremities with other complications: Secondary | ICD-10-CM | POA: Diagnosis not present

## 2017-12-04 DIAGNOSIS — J301 Allergic rhinitis due to pollen: Secondary | ICD-10-CM | POA: Diagnosis not present

## 2017-12-13 DIAGNOSIS — M25562 Pain in left knee: Secondary | ICD-10-CM | POA: Insufficient documentation

## 2017-12-13 DIAGNOSIS — M1711 Unilateral primary osteoarthritis, right knee: Secondary | ICD-10-CM | POA: Diagnosis not present

## 2017-12-13 DIAGNOSIS — M1712 Unilateral primary osteoarthritis, left knee: Secondary | ICD-10-CM | POA: Diagnosis not present

## 2017-12-13 DIAGNOSIS — J3089 Other allergic rhinitis: Secondary | ICD-10-CM | POA: Diagnosis not present

## 2017-12-20 DIAGNOSIS — J3089 Other allergic rhinitis: Secondary | ICD-10-CM | POA: Diagnosis not present

## 2017-12-25 DIAGNOSIS — J3089 Other allergic rhinitis: Secondary | ICD-10-CM | POA: Diagnosis not present

## 2018-01-01 DIAGNOSIS — J3089 Other allergic rhinitis: Secondary | ICD-10-CM | POA: Diagnosis not present

## 2018-01-01 DIAGNOSIS — H401131 Primary open-angle glaucoma, bilateral, mild stage: Secondary | ICD-10-CM | POA: Diagnosis not present

## 2018-01-08 DIAGNOSIS — J3089 Other allergic rhinitis: Secondary | ICD-10-CM | POA: Diagnosis not present

## 2018-01-09 DIAGNOSIS — J3089 Other allergic rhinitis: Secondary | ICD-10-CM | POA: Diagnosis not present

## 2018-01-14 ENCOUNTER — Other Ambulatory Visit: Payer: Self-pay | Admitting: Family Medicine

## 2018-01-14 NOTE — Telephone Encounter (Signed)
Copied from [place] #[id number]. Topic: Quick Communication - Rx Refill/Question >> Jan 14, 2018 10:01 AM Synthia Innocent wrote: Medication:telmisartan ([place]) 80 MG tablet  Has the patient contacted their pharmacy? Yes.   (Agent: If no, request that the patient contact the pharmacy for the refill.) (Agent: If yes, when and what did the pharmacy advise?)  Preferred Pharmacy (with phone number or street name): [facility]  Agent: Please be advised that RX refills may take up to 3 business days. We ask that you follow-up with your pharmacy.

## 2018-01-14 NOTE — Telephone Encounter (Signed)
Please call her and ask her about this Thanks

## 2018-01-14 NOTE — Telephone Encounter (Signed)
See OV on 02/28/17, per pt and Dr. Marliss Coots conversation med was removed due to side eff, but it looks like it was added back as a historical med on 07/23/17 at GI appt, please advise

## 2018-01-14 NOTE — Telephone Encounter (Signed)
Refill of Micardia by historical provider  LRF 07/04/17  LOV 11/09/17 Dr. Glori Bickers  [facility]

## 2018-01-15 NOTE — Telephone Encounter (Signed)
Left VM requesting pt to call the office back, CRM created 

## 2018-01-16 ENCOUNTER — Telehealth: Payer: Self-pay | Admitting: Family Medicine

## 2018-01-16 DIAGNOSIS — M79672 Pain in left foot: Secondary | ICD-10-CM | POA: Diagnosis not present

## 2018-01-16 DIAGNOSIS — D2372 Other benign neoplasm of skin of left lower limb, including hip: Secondary | ICD-10-CM | POA: Diagnosis not present

## 2018-01-16 DIAGNOSIS — M2022 Hallux rigidus, left foot: Secondary | ICD-10-CM | POA: Diagnosis not present

## 2018-01-16 NOTE — Telephone Encounter (Signed)
Left message for pt to return call to the office to ask questions provided in the [place].

## 2018-01-16 NOTE — Telephone Encounter (Signed)
See telephone encounter from 01/14/18

## 2018-01-16 NOTE — Telephone Encounter (Signed)
Copied from [place] [id number]. Topic: Quick Communication - See Telephone Encounter >> Jan 15, 2018  4:18 PM Tammi Sou, CMA wrote: CRM for notification. See Telephone encounter regarding refill request of micardis. Per phone note from 02/28/17 pt was to stay off of med due to side effects. Please have pt speak with nurse. ? If pt has been taking this med. ? How long and why did she restart it if PCP and advised her to discontinue med., Did another provider tell her to restart it. Please have nurse document pt's response in phone note  Please contact patient.

## 2018-01-17 DIAGNOSIS — J3089 Other allergic rhinitis: Secondary | ICD-10-CM | POA: Diagnosis not present

## 2018-01-22 DIAGNOSIS — J3089 Other allergic rhinitis: Secondary | ICD-10-CM | POA: Diagnosis not present

## 2018-01-31 DIAGNOSIS — I8312 Varicose veins of left lower extremity with inflammation: Secondary | ICD-10-CM | POA: Diagnosis not present

## 2018-01-31 DIAGNOSIS — J3089 Other allergic rhinitis: Secondary | ICD-10-CM | POA: Diagnosis not present

## 2018-01-31 DIAGNOSIS — I83893 Varicose veins of bilateral lower extremities with other complications: Secondary | ICD-10-CM | POA: Diagnosis not present

## 2018-01-31 DIAGNOSIS — I87323 Chronic venous hypertension (idiopathic) with inflammation of bilateral lower extremity: Secondary | ICD-10-CM | POA: Diagnosis not present

## 2018-01-31 DIAGNOSIS — I8311 Varicose veins of right lower extremity with inflammation: Secondary | ICD-10-CM | POA: Diagnosis not present

## 2018-02-08 DIAGNOSIS — J3089 Other allergic rhinitis: Secondary | ICD-10-CM | POA: Diagnosis not present

## 2018-02-14 DIAGNOSIS — J3089 Other allergic rhinitis: Secondary | ICD-10-CM | POA: Diagnosis not present

## 2018-02-19 DIAGNOSIS — J3089 Other allergic rhinitis: Secondary | ICD-10-CM | POA: Diagnosis not present

## 2018-02-19 DIAGNOSIS — I87323 Chronic venous hypertension (idiopathic) with inflammation of bilateral lower extremity: Secondary | ICD-10-CM | POA: Diagnosis not present

## 2018-02-25 DIAGNOSIS — M2022 Hallux rigidus, left foot: Secondary | ICD-10-CM | POA: Diagnosis not present

## 2018-02-25 DIAGNOSIS — M79672 Pain in left foot: Secondary | ICD-10-CM | POA: Diagnosis not present

## 2018-02-25 DIAGNOSIS — D2372 Other benign neoplasm of skin of left lower limb, including hip: Secondary | ICD-10-CM | POA: Diagnosis not present

## 2018-02-28 DIAGNOSIS — J3089 Other allergic rhinitis: Secondary | ICD-10-CM | POA: Diagnosis not present

## 2018-03-13 ENCOUNTER — Other Ambulatory Visit: Payer: Self-pay | Admitting: Family Medicine

## 2018-03-14 NOTE — Telephone Encounter (Signed)
Pt has only had a recent acute appt but no recent f/u or CPE, please advise

## 2018-03-14 NOTE — Telephone Encounter (Signed)
Med refilled once and Morey Hummingbird will reach out to pt to schedule CPE

## 2018-03-14 NOTE — Telephone Encounter (Signed)
Please schedule a fall PE and refill until then  A Thursday is fine

## 2018-03-15 DIAGNOSIS — J3089 Other allergic rhinitis: Secondary | ICD-10-CM | POA: Diagnosis not present

## 2018-03-18 ENCOUNTER — Telehealth: Payer: Self-pay | Admitting: Family Medicine

## 2018-03-18 NOTE — Telephone Encounter (Signed)
Copied from [place] [id number]. Topic: Appointment Scheduling - Scheduling Inquiry for Clinic >> Mar 18, 2018 11:47 AM Amy Herman wrote: Reason for CRM: Pt would like to know if Dr. Glori Bickers would be willing to see her for a physical on 9/27. Advised pt there was no availability but pt states that is the only day that she has off of work. Pt is requesting a call back at [phone].  Dr.Tower, You already have 5 physicals on 03/29/18. Will you see patient for a cpx on 03/29/18?

## 2018-03-18 NOTE — Telephone Encounter (Signed)
I left a message on patient's voice mail asking her to call me back.

## 2018-03-18 NOTE — Telephone Encounter (Signed)
If you can find 30 min to fit her in-I am fine with it

## 2018-03-19 ENCOUNTER — Telehealth: Payer: Self-pay | Admitting: Family Medicine

## 2018-03-19 DIAGNOSIS — E7800 Pure hypercholesterolemia, unspecified: Secondary | ICD-10-CM

## 2018-03-19 DIAGNOSIS — I10 Essential (primary) hypertension: Secondary | ICD-10-CM

## 2018-03-19 NOTE — Telephone Encounter (Signed)
-----   Message from Ellamae Sia sent at 03/19/2018  4:17 PM EDT ----- Regarding: Lab orders for [place], 9.18.19 Patient is scheduled for CPX labs, please order future labs, Thanks , Karna Christmas

## 2018-03-19 NOTE — Telephone Encounter (Signed)
I spoke to patient and she scheduled labs on 03/20/18 and cpx on 03/29/18 at 10:45.

## 2018-03-20 ENCOUNTER — Other Ambulatory Visit (INDEPENDENT_AMBULATORY_CARE_PROVIDER_SITE_OTHER): Payer: Medicare Other

## 2018-03-20 ENCOUNTER — Telehealth: Payer: Self-pay

## 2018-03-20 DIAGNOSIS — I10 Essential (primary) hypertension: Secondary | ICD-10-CM

## 2018-03-20 DIAGNOSIS — E7800 Pure hypercholesterolemia, unspecified: Secondary | ICD-10-CM

## 2018-03-20 DIAGNOSIS — M79672 Pain in left foot: Secondary | ICD-10-CM | POA: Diagnosis not present

## 2018-03-20 DIAGNOSIS — D2372 Other benign neoplasm of skin of left lower limb, including hip: Secondary | ICD-10-CM | POA: Diagnosis not present

## 2018-03-20 LAB — CBC WITH DIFFERENTIAL/PLATELET
BASOS PCT: 2.9 % (ref 0.0–3.0)
Basophils Absolute: 0.1 10*3/uL (ref 0.0–0.1)
EOS ABS: 0.3 10*3/uL (ref 0.0–0.7)
EOS PCT: 5.7 % — AB (ref 0.0–5.0)
HCT: 39.7 % (ref 36.0–46.0)
HEMOGLOBIN: 13.4 g/dL (ref 12.0–15.0)
LYMPHS ABS: 1.9 10*3/uL (ref 0.7–4.0)
Lymphocytes Relative: 37.4 % (ref 12.0–46.0)
MCHC: 33.6 g/dL (ref 30.0–36.0)
MCV: 89.8 fl (ref 78.0–100.0)
MONO ABS: 0.5 10*3/uL (ref 0.1–1.0)
Monocytes Relative: 8.8 % (ref 3.0–12.0)
NEUTROS ABS: 2.3 10*3/uL (ref 1.4–7.7)
NEUTROS PCT: 45.2 % (ref 43.0–77.0)
Platelets: 294 10*3/uL (ref 150.0–400.0)
RBC: 4.42 Mil/uL (ref 3.87–5.11)
RDW: 12.6 % (ref 11.5–15.5)
WBC: 5.1 10*3/uL (ref 4.0–10.5)

## 2018-03-20 LAB — COMPREHENSIVE METABOLIC PANEL
ALBUMIN: 3.9 g/dL (ref 3.5–5.2)
ALT: 17 U/L (ref 0–35)
AST: 18 U/L (ref 0–37)
Alkaline Phosphatase: 86 U/L (ref 39–117)
BUN: 14 mg/dL (ref 6–23)
CHLORIDE: 104 meq/L (ref 96–112)
CO2: 31 meq/L (ref 19–32)
CREATININE: 0.75 mg/dL (ref 0.40–1.20)
Calcium: 9.7 mg/dL (ref 8.4–10.5)
GFR: 80.49 mL/min (ref >60.00)
Glucose, Bld: 87 mg/dL (ref 70–99)
POTASSIUM: 4 meq/L (ref 3.5–5.1)
SODIUM: 140 meq/L (ref 135–145)
Total Bilirubin: 0.4 mg/dL (ref 0.2–1.2)
Total Protein: 7.1 g/dL (ref 6.0–8.3)

## 2018-03-20 LAB — LIPID PANEL
CHOLESTEROL: 219 mg/dL — AB (ref 0–200)
HDL: 53.4 mg/dL (ref >39.00)
LDL CALC: 148 mg/dL — AB (ref 0–99)
NonHDL: 166.04
Total CHOL/HDL Ratio: 4
Triglycerides: 88 mg/dL (ref 0.0–149.0)
VLDL: 17.6 mg/dL (ref 0.0–40.0)

## 2018-03-20 LAB — TSH: TSH: 1.28 u[IU]/mL (ref 0.35–4.50)

## 2018-03-20 NOTE — Telephone Encounter (Signed)
Copied from [place] [id number]. Topic: General - Other >> Mar 20, 2018  8:41 AM Ivar Drape wrote: Reason for CRM:  Patient is having some calluses treated before the labs.  The doctor puts some kind of acid on her foot and she wants to know if that would interfere with her labs.  If she does not answer, please leave the answer on her cell phone voicemail.

## 2018-03-20 NOTE — Telephone Encounter (Signed)
I do not think so Not unless she has an infection

## 2018-03-20 NOTE — Telephone Encounter (Signed)
Pt notified of Dr. Tower's comments  

## 2018-03-29 ENCOUNTER — Encounter: Payer: Self-pay | Admitting: Family Medicine

## 2018-03-29 ENCOUNTER — Ambulatory Visit (INDEPENDENT_AMBULATORY_CARE_PROVIDER_SITE_OTHER): Payer: Medicare Other | Admitting: Family Medicine

## 2018-03-29 ENCOUNTER — Encounter: Payer: Medicare Other | Admitting: Family Medicine

## 2018-03-29 VITALS — BP 126/78 | HR 65 | Temp 98.7°F | Ht 60.5 in | Wt 139.0 lb

## 2018-03-29 DIAGNOSIS — E7800 Pure hypercholesterolemia, unspecified: Secondary | ICD-10-CM | POA: Diagnosis not present

## 2018-03-29 DIAGNOSIS — Z23 Encounter for immunization: Secondary | ICD-10-CM | POA: Diagnosis not present

## 2018-03-29 DIAGNOSIS — M85859 Other specified disorders of bone density and structure, unspecified thigh: Secondary | ICD-10-CM | POA: Diagnosis not present

## 2018-03-29 DIAGNOSIS — I10 Essential (primary) hypertension: Secondary | ICD-10-CM

## 2018-03-29 DIAGNOSIS — Z0000 Encounter for general adult medical examination without abnormal findings: Secondary | ICD-10-CM | POA: Diagnosis not present

## 2018-03-29 DIAGNOSIS — J3089 Other allergic rhinitis: Secondary | ICD-10-CM | POA: Diagnosis not present

## 2018-03-29 MED ORDER — TRIAMTERENE-HCTZ 37.5-25 MG PO TABS: 1.0000 | ORAL_TABLET | Freq: Every day | ORAL | 3 refills | Status: DC

## 2018-03-29 MED ORDER — POTASSIUM CHLORIDE ER 10 MEQ PO TBCR: EXTENDED_RELEASE_TABLET | ORAL | 3 refills | Status: DC

## 2018-03-29 NOTE — Patient Instructions (Addendum)
Bring copy of your advance directive next time you are in and we will scan it into your chart   Flu shot today   Keep taking good care of yourself

## 2018-03-29 NOTE — Progress Notes (Signed)
Subjective:    Patient ID: Amy Herman, female    DOB: [birth year], 72 y.o.   MRN: [id number]  HPI Here for medicare wellness visit and to rev chronic medical problems   I have personally reviewed the Medicare Annual Wellness questionnaire and have noted 1. The patient's medical and social history 2. Their use of alcohol, tobacco or illicit drugs 3. Their current medications and supplements 4. The patient's functional ability including ADL's, fall risks, home safety risks and hearing or visual             impairment. 5. Diet and physical activities 6. Evidence for depression or mood disorders  The patients weight, height, BMI have been recorded in the chart and visual acuity is per eye clinic.  I have made referrals, counseling and provided education to the patient based review of the above and I have provided the pt with a written personalized care plan for preventive services. Reviewed and updated provider list, see scanned forms.  See scanned forms.  Routine anticipatory guidance given to patient.  See health maintenance. Colon cancer screening:  Colonoscopy 1/19 with no recall due to age  Breast cancer screening mammogram 2/19 neg Self breast exam-no lumps  Pap /gyn  Neg 10/18  Flu vaccine-given today  Tetanus vaccine 1/19 Pneumovax-completed Zoster vaccine- zostavax 12/08 2/18 dexa -nl (borderline for osteopenia in FN) No falls or fracture  Advance directive- has living will and POA - (her daughter) Cognitive function addressed- see scanned forms- and if abnormal then additional documentation follows.  No problems with memory or cognition at all   Busy- sub teaching and works at [employer] ok with grief /lost husb Lots of support   PMH and [place] reviewed  Meds, vitals, and allergies reviewed.   ROS: See HPI.  Otherwise negative.    Hearing/vision  Hearing Screening   125Hz  250Hz  500Hz  1000Hz  2000Hz  3000Hz  4000Hz  6000Hz  8000Hz   Right ear:   40 40 40  40      Left ear:   40 40 40  40    Vision Screening Comments: [place] 2019  Falls -none   Wt Readings from Last 3 Encounters:  03/29/18 139 lb (63 kg)  11/09/17 145 lb 8 oz (66 kg)  08/01/17 149 lb (67.6 kg)  going to the gym Eats very healthy  Lost 6 lb 26.70 kg/m    bp is stable today  No cp or palpitations or headaches or edema  No side effects to medicines  BP Readings from Last 3 Encounters:  03/29/18 126/78  11/09/17 136/74  08/01/17 (!) 109/48     Hyperlipidemia Lab Results  Component Value Date   CHOL 219 (H) 03/20/2018   CHOL 213 (H) 02/19/2017   CHOL 251 (H) 05/08/2016   Lab Results  Component Value Date   HDL 53.40 03/20/2018   HDL 56.90 02/19/2017   HDL 65.10 05/08/2016   Lab Results  Component Value Date   LDLCALC 148 (H) 03/20/2018   LDLCALC 131 (H) 02/19/2017   LDLCALC 159 (H) 05/08/2016   Lab Results  Component Value Date   TRIG 88.0 03/20/2018   TRIG 129.0 02/19/2017   TRIG 133.0 05/08/2016   Lab Results  Component Value Date   CHOLHDL 4 03/20/2018   CHOLHDL 4 02/19/2017   CHOLHDL 4 05/08/2016   No results found for: LDLDIRECT  Statin intolerant  Stable ratio  Diet is as good as it can be   Other labs:  Results for  orders placed or performed in visit on 03/20/18  TSH  Result Value Ref Range   TSH 1.28 0.35 - 4.50 uIU/mL  Lipid panel  Result Value Ref Range   Cholesterol 219 (H) 0 - 200 mg/dL   Triglycerides 88.0 0.0 - 149.0 mg/dL   HDL 53.40 >39.00 mg/dL   VLDL 17.6 0.0 - 40.0 mg/dL   LDL Cholesterol 148 (H) 0 - 99 mg/dL   Total CHOL/HDL Ratio 4    NonHDL 166.04   Comprehensive metabolic panel  Result Value Ref Range   Sodium 140 135 - 145 mEq/L   Potassium 4.0 3.5 - 5.1 mEq/L   Chloride 104 96 - 112 mEq/L   CO2 31 19 - 32 mEq/L   Glucose, Bld 87 70 - 99 mg/dL   BUN 14 6 - 23 mg/dL   Creatinine, Ser 0.75 0.40 - 1.20 mg/dL   Total Bilirubin 0.4 0.2 - 1.2 mg/dL   Alkaline Phosphatase 86 39 - 117 U/L   AST 18 0 - 37  U/L   ALT 17 0 - 35 U/L   Total Protein 7.1 6.0 - 8.3 g/dL   Albumin 3.9 3.5 - 5.2 g/dL   Calcium 9.7 8.4 - 10.5 mg/dL   GFR 80.49 >60.00 mL/min  CBC with Differential/Platelet  Result Value Ref Range   WBC 5.1 4.0 - 10.5 K/uL   RBC 4.42 3.87 - 5.11 Mil/uL   Hemoglobin 13.4 12.0 - 15.0 g/dL   HCT 39.7 36.0 - 46.0 %   MCV 89.8 78.0 - 100.0 fl   MCHC 33.6 30.0 - 36.0 g/dL   RDW 12.6 11.5 - 15.5 %   Platelets 294.0 150.0 - 400.0 K/uL   Neutrophils Relative % 45.2 43.0 - 77.0 %   Lymphocytes Relative 37.4 12.0 - 46.0 %   Monocytes Relative 8.8 3.0 - 12.0 %   Eosinophils Relative 5.7 (H) 0.0 - 5.0 %   Basophils Relative 2.9 0.0 - 3.0 %   Neutro Abs 2.3 1.4 - 7.7 K/uL   Lymphs Abs 1.9 0.7 - 4.0 K/uL   Monocytes Absolute 0.5 0.1 - 1.0 K/uL   Eosinophils Absolute 0.3 0.0 - 0.7 K/uL   Basophils Absolute 0.1 0.0 - 0.1 K/uL     Patient Active Problem List   Diagnosis Date Noted  . Medicare annual wellness visit, initial 03/29/2018  . History of UTI 11/11/2017  . Encounter for screening mammogram for breast cancer 05/08/2016  . Estrogen deficiency 02/16/2016  . Contact with or exposure to communicable disease 02/16/2016  . Pre-operative cardiovascular examination 07/21/2013  . GERD (gastroesophageal reflux disease) 06/18/2013  . Varicose veins of lower extremities with other complications 52/77/8242  . Lumbar disc disease 08/15/2011  . Joint pain 08/15/2011  . Osteopenia 01/31/2011  . Post-menopausal 01/31/2011  . Hypokalemia 01/31/2011  . Hyperlipidemia 03/24/2008  . ESSENTIAL HYPERTENSION, BENIGN 03/24/2008  . COLONIC POLYPS, HX OF 03/24/2008   Past Medical History:  Diagnosis Date  . Allergic rhinitis   . Allergy   . Bronchitis    more frequent in the past  . Cataract    "suspect per pt"  . Colon polyps    history of  . GERD (gastroesophageal reflux disease)   . Glaucoma    suspected  . Heart murmur   . Hyperlipidemia   . Hypertension   . Osteoarthritis of knee    . Osteopenia    Past Surgical History:  Procedure Laterality Date  . BUNIONECTOMY     bilateral-PIN LEFT  GREAT TOE  . CARPAL TUNNEL RELEASE    . COLONOSCOPY    . FUNCTIONAL ENDOSCOPIC SINUS SURGERY     05-04-2011  . KNEE ARTHROSCOPY     right  . TUBAL LIGATION     bilateral  . VEIN SURGERY  2006   legs   Social History   Tobacco Use  . Smoking status: Never Smoker  . Smokeless tobacco: Never Used  . Tobacco comment: used to have secondary smoke exposure from husband  Substance Use Topics  . Alcohol use: No    Alcohol/week: 0.0 standard drinks  . Drug use: No   Family History  Problem Relation Age of Onset  . Cancer Mother        colon, lungs, liver  . Colon cancer Mother 43  . Cancer Father        prostate  . Heart disease Father        A-fib  . Stroke Father        TIA's  . Alcohol abuse Brother   . Cancer Brother        pancreatic, smoker  . Diabetes Brother   . Diabetes Brother   . Stomach cancer Neg Hx   . Esophageal cancer Neg Hx   . Rectal cancer Neg Hx   . Breast cancer Neg Hx    Allergies  Allergen Reactions  . Cefuroxime     Other reaction(s): RASH  . Cefuroxime Axetil     REACTION: bumps on tounge  . Cefuroxime Axetil Other (See Comments)    Other reaction(s): RASH Other reaction(s): RASH REACTION: bumps on tounge  . Clarithromycin Other (See Comments)    Other reaction(s): OTHER Other reaction(s): OTHER REACTION: GI Biaxin   . Clarithromycin Other (See Comments)    Burning sensation to stomach  . Levofloxacin   . Levofloxacin Other (See Comments)    Joint pains  . Lipitor [Atorvastatin] Other (See Comments)    Muscle pain  Muscle pain   . Losartan Other (See Comments)    cough cough  . Other     Other reaction(s): Unknown  . Rosuvastatin Other (See Comments)    REACTION: severe muscle pain REACTION: severe muscle pain  . Valsartan     Taken off from MD d/t a recall   Current Outpatient Medications on File Prior to Visit   Medication Sig Dispense Refill  . ALBUTEROL IN Inhale into the lungs as needed.    . Ascorbic Acid (VITAMIN C) 1000 MG tablet Take by mouth.    Marland Kitchen aspirin 81 MG EC tablet Take 81 mg by mouth daily.      . bimatoprost (LUMIGAN) 0.01 % SOLN     . Bisacodyl (DULCOLAX PO) Take by mouth. For colonoscopy 08-01-17    . calcium citrate-vitamin D (CITRACAL+D) 315-200 MG-UNIT per tablet Take 1 tablet by mouth daily.      . fexofenadine (ALLEGRA) 180 MG tablet Take 180 mg by mouth as needed for allergies or rhinitis.    . Garlic (ODOR FREE GARLIC) 656 MG TABS Take 2 tablets by mouth 2 (two) times daily.     . meloxicam (MOBIC) 15 MG tablet Take 1 tablet by mouth  daily with food for pain 90 tablet 3  . metoCLOPramide (REGLAN) 5 MG tablet Take 1 tablet (5 mg total) by mouth as directed. Take 1 tablet 20-30 minutes before each prep dose 3 tablet 0  . Omega-3 Fatty Acids (OMEGA 3 PO) Take by mouth daily.    Marland Kitchen  omeprazole (PRILOSEC) 20 MG capsule Take 20 mg by mouth as needed.    . Polyethylene Glycol 3350 (MIRALAX PO) Take by mouth. For colonoscopy 08-01-17    . Triamcinolone Acetonide (NASACORT AQ NA) Place into the nose daily.    . vitamin D, CHOLECALCIFEROL, 400 UNITS tablet Take 400 Units by mouth 2 (two) times daily.       No current facility-administered medications on file prior to visit.     Review of Systems  Constitutional: Negative for activity change, appetite change, fatigue, fever and unexpected weight change.  HENT: Negative for congestion, ear pain, rhinorrhea, sinus pressure and sore throat.   Eyes: Negative for pain, redness and visual disturbance.  Respiratory: Negative for cough, shortness of breath and wheezing.   Cardiovascular: Negative for chest pain and palpitations.  Gastrointestinal: Negative for abdominal pain, blood in stool, constipation and diarrhea.  Endocrine: Negative for polydipsia and polyuria.  Genitourinary: Negative for dysuria, frequency and urgency.   Musculoskeletal: Negative for arthralgias, back pain and myalgias.  Skin: Negative for pallor and rash.  Allergic/Immunologic: Negative for environmental allergies.  Neurological: Negative for dizziness, syncope and headaches.  Hematological: Negative for adenopathy. Does not bruise/bleed easily.  Psychiatric/Behavioral: Negative for decreased concentration and dysphoric mood. The patient is not nervous/anxious.        Objective:   Physical Exam  Constitutional: She appears well-developed and well-nourished. No distress.  Well appearing   HENT:  Head: Normocephalic and atraumatic.  Right Ear: External ear normal.  Left Ear: External ear normal.  Mouth/Throat: Oropharynx is clear and moist.  Eyes: Pupils are equal, round, and reactive to light. Conjunctivae and EOM are normal. No scleral icterus.  Neck: Normal range of motion. Neck supple. No JVD present. Carotid bruit is not present. No thyromegaly present.  Cardiovascular: Normal rate, regular rhythm, normal heart sounds and intact distal pulses. Exam reveals no gallop.  Pulmonary/Chest: Effort normal and breath sounds normal. No respiratory distress. She has no wheezes. She exhibits no tenderness. No breast tenderness, discharge or bleeding.  Abdominal: Soft. Bowel sounds are normal. She exhibits no distension, no abdominal bruit and no mass. There is no tenderness.  Genitourinary: No breast tenderness, discharge or bleeding.  Genitourinary Comments: Breast exam: No mass, nodules, thickening, tenderness, bulging, retraction, inflamation, nipple discharge or skin changes noted.  No axillary or clavicular LA.      Musculoskeletal: Normal range of motion. She exhibits no edema or tenderness.  Lymphadenopathy:    She has no cervical adenopathy.  Neurological: She is alert. She has normal reflexes. She displays normal reflexes. No cranial nerve deficit. She exhibits normal muscle tone. Coordination normal.  Skin: Skin is warm and dry. No  rash noted. No erythema. No pallor.  Solar lentigines diffusely Few sks  Psychiatric: She has a normal mood and affect. Cognition and memory are normal.  Pleasant and mentally sharp           Assessment & Plan:   Problem List Items Addressed This Visit      Cardiovascular and Mediastinum   ESSENTIAL HYPERTENSION, BENIGN    bp in fair control at this time  BP Readings from Last 1 Encounters:  03/29/18 126/78   No changes needed Most recent labs reviewed  Disc lifstyle change with low sodium diet and exercise        Relevant Medications   triamterene-hydrochlorothiazide (MAXZIDE-25) 37.5-25 MG tablet     Musculoskeletal and Integument   Osteopenia    dexa 2/18 rev No falls  or fractures  Takes ca and D        Other   Hyperlipidemia    Disc goals for lipids and reasons to control them Rev last labs with pt Rev low sat fat diet in detail  Statin intolerant and not at goal        Relevant Medications   triamterene-hydrochlorothiazide (MAXZIDE-25) 37.5-25 MG tablet   Medicare annual wellness visit, initial - Primary    Reviewed health habits including diet and exercise and skin cancer prevention Reviewed appropriate screening tests for age  Also reviewed health mt list, fam hx and immunization status , as well as social and family history   See HPI Labs reviewed  Asked to bring a copy of ADV directive to scan Flu shot today  Enc continued good self care        Other Visit Diagnoses    Need for immunization against influenza       Relevant Orders   Flu Vaccine QUAD 36+ mos IM (Completed)

## 2018-03-30 NOTE — Assessment & Plan Note (Signed)
dexa 2/18 rev No falls or fractures  Takes ca and D

## 2018-03-30 NOTE — Assessment & Plan Note (Signed)
Disc goals for lipids and reasons to control them Rev last labs with pt Rev low sat fat diet in detail  Statin intolerant and not at goal

## 2018-03-30 NOTE — Assessment & Plan Note (Signed)
bp in fair control at this time  BP Readings from Last 1 Encounters:  03/29/18 126/78   No changes needed Most recent labs reviewed  Disc lifstyle change with low sodium diet and exercise

## 2018-03-30 NOTE — Assessment & Plan Note (Signed)
Reviewed health habits including diet and exercise and skin cancer prevention Reviewed appropriate screening tests for age  Also reviewed health mt list, fam hx and immunization status , as well as social and family history   See HPI Labs reviewed  Asked to bring a copy of ADV directive to scan Flu shot today  Enc continued good self care

## 2018-04-11 DIAGNOSIS — J3089 Other allergic rhinitis: Secondary | ICD-10-CM | POA: Diagnosis not present

## 2018-04-16 DIAGNOSIS — J3089 Other allergic rhinitis: Secondary | ICD-10-CM | POA: Diagnosis not present

## 2018-04-23 DIAGNOSIS — J3089 Other allergic rhinitis: Secondary | ICD-10-CM | POA: Diagnosis not present

## 2018-05-14 DIAGNOSIS — J3089 Other allergic rhinitis: Secondary | ICD-10-CM | POA: Diagnosis not present

## 2018-05-16 DIAGNOSIS — M1711 Unilateral primary osteoarthritis, right knee: Secondary | ICD-10-CM | POA: Diagnosis not present

## 2018-05-16 DIAGNOSIS — M1712 Unilateral primary osteoarthritis, left knee: Secondary | ICD-10-CM | POA: Diagnosis not present

## 2018-05-23 DIAGNOSIS — J3089 Other allergic rhinitis: Secondary | ICD-10-CM | POA: Diagnosis not present

## 2018-05-23 DIAGNOSIS — M170 Bilateral primary osteoarthritis of knee: Secondary | ICD-10-CM | POA: Diagnosis not present

## 2018-05-28 DIAGNOSIS — J3089 Other allergic rhinitis: Secondary | ICD-10-CM | POA: Diagnosis not present

## 2018-06-04 DIAGNOSIS — D2372 Other benign neoplasm of skin of left lower limb, including hip: Secondary | ICD-10-CM | POA: Diagnosis not present

## 2018-06-04 DIAGNOSIS — Q66211 Congenital metatarsus primus varus, right foot: Secondary | ICD-10-CM | POA: Diagnosis not present

## 2018-06-04 DIAGNOSIS — J3089 Other allergic rhinitis: Secondary | ICD-10-CM | POA: Diagnosis not present

## 2018-06-04 DIAGNOSIS — M79671 Pain in right foot: Secondary | ICD-10-CM | POA: Diagnosis not present

## 2018-06-11 DIAGNOSIS — J3089 Other allergic rhinitis: Secondary | ICD-10-CM | POA: Diagnosis not present

## 2018-06-25 DIAGNOSIS — J3089 Other allergic rhinitis: Secondary | ICD-10-CM | POA: Diagnosis not present

## 2018-07-05 DIAGNOSIS — J3089 Other allergic rhinitis: Secondary | ICD-10-CM | POA: Diagnosis not present

## 2018-07-09 DIAGNOSIS — S92514A Nondisplaced fracture of proximal phalanx of right lesser toe(s), initial encounter for closed fracture: Secondary | ICD-10-CM | POA: Diagnosis not present

## 2018-07-09 DIAGNOSIS — M79671 Pain in right foot: Secondary | ICD-10-CM | POA: Diagnosis not present

## 2018-07-23 DIAGNOSIS — J3089 Other allergic rhinitis: Secondary | ICD-10-CM | POA: Diagnosis not present

## 2018-07-23 DIAGNOSIS — S92514D Nondisplaced fracture of proximal phalanx of right lesser toe(s), subsequent encounter for fracture with routine healing: Secondary | ICD-10-CM | POA: Diagnosis not present

## 2018-08-01 DIAGNOSIS — H401131 Primary open-angle glaucoma, bilateral, mild stage: Secondary | ICD-10-CM | POA: Diagnosis not present

## 2018-08-01 DIAGNOSIS — J3089 Other allergic rhinitis: Secondary | ICD-10-CM | POA: Diagnosis not present

## 2018-08-12 ENCOUNTER — Other Ambulatory Visit: Payer: Self-pay | Admitting: Family Medicine

## 2018-08-12 DIAGNOSIS — Z1231 Encounter for screening mammogram for malignant neoplasm of breast: Secondary | ICD-10-CM

## 2018-08-13 DIAGNOSIS — J3089 Other allergic rhinitis: Secondary | ICD-10-CM | POA: Diagnosis not present

## 2018-08-23 DIAGNOSIS — J3089 Other allergic rhinitis: Secondary | ICD-10-CM | POA: Diagnosis not present

## 2018-08-27 DIAGNOSIS — J3089 Other allergic rhinitis: Secondary | ICD-10-CM | POA: Diagnosis not present

## 2018-08-30 DIAGNOSIS — J3089 Other allergic rhinitis: Secondary | ICD-10-CM | POA: Diagnosis not present

## 2018-09-05 DIAGNOSIS — J3089 Other allergic rhinitis: Secondary | ICD-10-CM | POA: Diagnosis not present

## 2018-09-17 ENCOUNTER — Ambulatory Visit
Admission: RE | Admit: 2018-09-17 | Discharge: 2018-09-17 | Disposition: A | Discharge status: Home / Self Care | Payer: Medicare Other | Source: Ambulatory Visit | Attending: Family Medicine | Admitting: Family Medicine

## 2018-09-17 ENCOUNTER — Ambulatory Visit: Payer: Medicare Other

## 2018-09-17 ENCOUNTER — Other Ambulatory Visit: Payer: Self-pay

## 2018-09-17 DIAGNOSIS — Z1231 Encounter for screening mammogram for malignant neoplasm of breast: Secondary | ICD-10-CM | POA: Insufficient documentation

## 2018-09-17 DIAGNOSIS — J3089 Other allergic rhinitis: Secondary | ICD-10-CM | POA: Diagnosis not present

## 2018-09-24 DIAGNOSIS — J3089 Other allergic rhinitis: Secondary | ICD-10-CM | POA: Diagnosis not present

## 2018-10-01 DIAGNOSIS — J3089 Other allergic rhinitis: Secondary | ICD-10-CM | POA: Diagnosis not present

## 2018-10-10 DIAGNOSIS — J301 Allergic rhinitis due to pollen: Secondary | ICD-10-CM | POA: Diagnosis not present

## 2018-10-10 DIAGNOSIS — J3089 Other allergic rhinitis: Secondary | ICD-10-CM | POA: Diagnosis not present

## 2018-10-15 DIAGNOSIS — J3089 Other allergic rhinitis: Secondary | ICD-10-CM | POA: Diagnosis not present

## 2018-10-22 DIAGNOSIS — J3089 Other allergic rhinitis: Secondary | ICD-10-CM | POA: Diagnosis not present

## 2018-10-29 DIAGNOSIS — J3089 Other allergic rhinitis: Secondary | ICD-10-CM | POA: Diagnosis not present

## 2018-10-30 ENCOUNTER — Telehealth: Payer: Self-pay | Admitting: Family Medicine

## 2018-10-30 NOTE — Telephone Encounter (Signed)
Patient is currently take Omeprazole for acid reflux. She stated she has seen on the news that they are advising people to stop taking acid control medication.   Patient would like to know what Dr Glori Bickers would recommend her taking  BEST PHONE- [phone]

## 2018-10-31 NOTE — Telephone Encounter (Signed)
Called pt and left a VM to call back. CRM created and sent to [facility] pool. 

## 2018-10-31 NOTE — Telephone Encounter (Signed)
Attempted to call patient with PCP response- left message for patient to office with response.

## 2018-10-31 NOTE — Telephone Encounter (Signed)
That sounds fine and I'm glad she does not need the omeprazole.  The supplements should be ok unless any of them cause side effects (like diarrhea).  Since they are vitamins/herbs they are not FDA approved for any specific purpose so always use caution with anything of that nature.

## 2018-10-31 NOTE — Telephone Encounter (Signed)
Does she know what they were referring to specifically ?   Ranitidine (zantac) was taken off the market for a contaminate issue - but she is not taking it)  The ppis like omeprazole are being watched closely because they may decrease absorption of some vitamins (B12/ magnesium) and there is a question re: long term effects on kidney function (I have not seen this in my practice)  For people who need it however the benefits outweigh the costs of reflux complications like esophageal cancer or vocal cord damage.   For folks who think they do not really need their ppi- I advise to start weaning it - every other day and go from there (there are usually some days of acid rebound in the beginning)

## 2018-10-31 NOTE — Telephone Encounter (Signed)
Called and spoke with pt. Pt advised and voiced understanding.  

## 2018-10-31 NOTE — Telephone Encounter (Signed)
Pt called me back. Spoke with pt. Pt stated that she used to take omeprazole over the counter but discontinued and is now only taking TUMS as needed she stated that her acid reflux honestly isn't even that back just acidic foods seem to trigger her acid reflux and the TUMS seems to help and she feels more comfortable taking TUMS over omeprazole.   Pt stated that she STOPPED the omeprazole due to commercials all over TV.   Pt wanted PCP to know as well that she is taking the following supplements and is it OK to take these supplements with her current medications? culturelle, elderberry, magnesium, zinc 3.75 mg

## 2018-11-05 DIAGNOSIS — J3089 Other allergic rhinitis: Secondary | ICD-10-CM | POA: Diagnosis not present

## 2018-11-12 DIAGNOSIS — J3089 Other allergic rhinitis: Secondary | ICD-10-CM | POA: Diagnosis not present

## 2018-11-19 DIAGNOSIS — J3089 Other allergic rhinitis: Secondary | ICD-10-CM | POA: Diagnosis not present

## 2018-11-26 DIAGNOSIS — J3089 Other allergic rhinitis: Secondary | ICD-10-CM | POA: Diagnosis not present

## 2018-12-03 DIAGNOSIS — J3089 Other allergic rhinitis: Secondary | ICD-10-CM | POA: Diagnosis not present

## 2018-12-10 DIAGNOSIS — J3089 Other allergic rhinitis: Secondary | ICD-10-CM | POA: Diagnosis not present

## 2018-12-17 DIAGNOSIS — J3089 Other allergic rhinitis: Secondary | ICD-10-CM | POA: Diagnosis not present

## 2018-12-18 DIAGNOSIS — M1712 Unilateral primary osteoarthritis, left knee: Secondary | ICD-10-CM | POA: Diagnosis not present

## 2018-12-18 DIAGNOSIS — M1711 Unilateral primary osteoarthritis, right knee: Secondary | ICD-10-CM | POA: Diagnosis not present

## 2018-12-24 DIAGNOSIS — J3089 Other allergic rhinitis: Secondary | ICD-10-CM | POA: Diagnosis not present

## 2018-12-25 DIAGNOSIS — M170 Bilateral primary osteoarthritis of knee: Secondary | ICD-10-CM | POA: Diagnosis not present

## 2019-01-02 DIAGNOSIS — R05 Cough: Secondary | ICD-10-CM | POA: Diagnosis not present

## 2019-01-02 DIAGNOSIS — J3089 Other allergic rhinitis: Secondary | ICD-10-CM | POA: Diagnosis not present

## 2019-01-02 DIAGNOSIS — J301 Allergic rhinitis due to pollen: Secondary | ICD-10-CM | POA: Diagnosis not present

## 2019-01-07 DIAGNOSIS — J3089 Other allergic rhinitis: Secondary | ICD-10-CM | POA: Diagnosis not present

## 2019-01-14 DIAGNOSIS — J3089 Other allergic rhinitis: Secondary | ICD-10-CM | POA: Diagnosis not present

## 2019-01-21 DIAGNOSIS — J3089 Other allergic rhinitis: Secondary | ICD-10-CM | POA: Diagnosis not present

## 2019-01-24 DIAGNOSIS — J3089 Other allergic rhinitis: Secondary | ICD-10-CM | POA: Diagnosis not present

## 2019-01-28 DIAGNOSIS — J3089 Other allergic rhinitis: Secondary | ICD-10-CM | POA: Diagnosis not present

## 2019-01-29 DIAGNOSIS — H401131 Primary open-angle glaucoma, bilateral, mild stage: Secondary | ICD-10-CM | POA: Diagnosis not present

## 2019-02-04 DIAGNOSIS — J3089 Other allergic rhinitis: Secondary | ICD-10-CM | POA: Diagnosis not present

## 2019-02-05 DIAGNOSIS — H401131 Primary open-angle glaucoma, bilateral, mild stage: Secondary | ICD-10-CM | POA: Diagnosis not present

## 2019-02-11 DIAGNOSIS — J3089 Other allergic rhinitis: Secondary | ICD-10-CM | POA: Diagnosis not present

## 2019-02-18 DIAGNOSIS — J3089 Other allergic rhinitis: Secondary | ICD-10-CM | POA: Diagnosis not present

## 2019-02-25 DIAGNOSIS — J3089 Other allergic rhinitis: Secondary | ICD-10-CM | POA: Diagnosis not present

## 2019-03-04 DIAGNOSIS — J3089 Other allergic rhinitis: Secondary | ICD-10-CM | POA: Diagnosis not present

## 2019-03-11 DIAGNOSIS — J3089 Other allergic rhinitis: Secondary | ICD-10-CM | POA: Diagnosis not present

## 2019-03-18 DIAGNOSIS — J3089 Other allergic rhinitis: Secondary | ICD-10-CM | POA: Diagnosis not present

## 2019-03-25 DIAGNOSIS — J3089 Other allergic rhinitis: Secondary | ICD-10-CM | POA: Diagnosis not present

## 2019-04-01 ENCOUNTER — Ambulatory Visit: Payer: Medicare Other

## 2019-04-01 DIAGNOSIS — J3089 Other allergic rhinitis: Secondary | ICD-10-CM | POA: Diagnosis not present

## 2019-04-01 DIAGNOSIS — M170 Bilateral primary osteoarthritis of knee: Secondary | ICD-10-CM | POA: Diagnosis not present

## 2019-04-08 DIAGNOSIS — J3089 Other allergic rhinitis: Secondary | ICD-10-CM | POA: Diagnosis not present

## 2019-04-10 ENCOUNTER — Ambulatory Visit (INDEPENDENT_AMBULATORY_CARE_PROVIDER_SITE_OTHER): Payer: Medicare Other

## 2019-04-10 DIAGNOSIS — Z23 Encounter for immunization: Secondary | ICD-10-CM

## 2019-04-15 DIAGNOSIS — J3089 Other allergic rhinitis: Secondary | ICD-10-CM | POA: Diagnosis not present

## 2019-04-22 DIAGNOSIS — J3089 Other allergic rhinitis: Secondary | ICD-10-CM | POA: Diagnosis not present

## 2019-04-25 ENCOUNTER — Other Ambulatory Visit: Payer: Self-pay | Admitting: Family Medicine

## 2019-04-28 ENCOUNTER — Telehealth: Payer: Self-pay | Admitting: Family Medicine

## 2019-04-28 MED ORDER — TRIAMTERENE-HCTZ 37.5-25 MG PO TABS: 1.0000 | ORAL_TABLET | Freq: Every day | ORAL | 0 refills | Status: DC

## 2019-04-28 NOTE — Telephone Encounter (Signed)
Patient stated she is completely of medication and would like to know if a 7 day supply could be sent to a local pharmacy until she can get it from her mail order   Triamterene-Hydrochlorothiazide 37.5-25  [facility] [place]

## 2019-04-29 DIAGNOSIS — J3089 Other allergic rhinitis: Secondary | ICD-10-CM | POA: Diagnosis not present

## 2019-04-30 ENCOUNTER — Telehealth: Payer: Self-pay | Admitting: Family Medicine

## 2019-04-30 DIAGNOSIS — I10 Essential (primary) hypertension: Secondary | ICD-10-CM

## 2019-04-30 DIAGNOSIS — E7800 Pure hypercholesterolemia, unspecified: Secondary | ICD-10-CM

## 2019-04-30 NOTE — Telephone Encounter (Signed)
-----   Message from Cloyd Stagers, RT sent at 04/21/2019  1:30 PM EDT ----- Regarding: Lab Orders for Thursday 10.29.2020 Please place lab orders for Thursday 10.29.2020, office visit for physical on Friday 11.6.2020 Thank you, Dyke Maes RT(R)

## 2019-05-01 ENCOUNTER — Other Ambulatory Visit: Payer: Medicare Other

## 2019-05-02 ENCOUNTER — Other Ambulatory Visit (INDEPENDENT_AMBULATORY_CARE_PROVIDER_SITE_OTHER): Payer: Medicare Other

## 2019-05-02 DIAGNOSIS — I10 Essential (primary) hypertension: Secondary | ICD-10-CM | POA: Diagnosis not present

## 2019-05-02 DIAGNOSIS — E7800 Pure hypercholesterolemia, unspecified: Secondary | ICD-10-CM | POA: Diagnosis not present

## 2019-05-02 LAB — LIPID PANEL
Cholesterol: 261 mg/dL — ABNORMAL HIGH (ref 0–200)
HDL: 53.4 mg/dL (ref >39.00)
LDL Cholesterol: 194 mg/dL — ABNORMAL HIGH (ref 0–99)
NonHDL: 208.04
Total CHOL/HDL Ratio: 5
Triglycerides: 68 mg/dL (ref 0.0–149.0)
VLDL: 13.6 mg/dL (ref 0.0–40.0)

## 2019-05-02 LAB — CBC WITH DIFFERENTIAL/PLATELET
Basophils Absolute: 0.1 10*3/uL (ref 0.0–0.1)
Basophils Relative: 1.6 % (ref 0.0–3.0)
Eosinophils Absolute: 0.1 10*3/uL (ref 0.0–0.7)
Eosinophils Relative: 2.2 % (ref 0.0–5.0)
HCT: 40.3 % (ref 36.0–46.0)
Hemoglobin: 14 g/dL (ref 12.0–15.0)
Lymphocytes Relative: 44.1 % (ref 12.0–46.0)
Lymphs Abs: 2.2 10*3/uL (ref 0.7–4.0)
MCHC: 34.6 g/dL (ref 30.0–36.0)
MCV: 88.7 fl (ref 78.0–100.0)
Monocytes Absolute: 0.5 10*3/uL (ref 0.1–1.0)
Monocytes Relative: 9.7 % (ref 3.0–12.0)
Neutro Abs: 2.1 10*3/uL (ref 1.4–7.7)
Neutrophils Relative %: 42.4 % — ABNORMAL LOW (ref 43.0–77.0)
Platelets: 299 10*3/uL (ref 150.0–400.0)
RBC: 4.55 Mil/uL (ref 3.87–5.11)
RDW: 12.3 % (ref 11.5–15.5)
WBC: 4.9 10*3/uL (ref 4.0–10.5)

## 2019-05-02 LAB — COMPREHENSIVE METABOLIC PANEL
ALT: 12 U/L (ref 0–35)
AST: 19 U/L (ref 0–37)
Albumin: 4.1 g/dL (ref 3.5–5.2)
Alkaline Phosphatase: 85 U/L (ref 39–117)
BUN: 21 mg/dL (ref 6–23)
CO2: 27 mEq/L (ref 19–32)
Calcium: 9.8 mg/dL (ref 8.4–10.5)
Chloride: 103 mEq/L (ref 96–112)
Creatinine, Ser: 0.85 mg/dL (ref 0.40–1.20)
GFR: 65.34 mL/min (ref >60.00)
Glucose, Bld: 105 mg/dL — ABNORMAL HIGH (ref 70–99)
Potassium: 3.3 mEq/L — ABNORMAL LOW (ref 3.5–5.1)
Sodium: 139 mEq/L (ref 135–145)
Total Bilirubin: 0.5 mg/dL (ref 0.2–1.2)
Total Protein: 7.3 g/dL (ref 6.0–8.3)

## 2019-05-02 LAB — TSH: TSH: 0.97 u[IU]/mL (ref 0.35–4.50)

## 2019-05-06 DIAGNOSIS — J3089 Other allergic rhinitis: Secondary | ICD-10-CM | POA: Diagnosis not present

## 2019-05-09 ENCOUNTER — Encounter: Payer: Self-pay | Admitting: Family Medicine

## 2019-05-09 ENCOUNTER — Ambulatory Visit (INDEPENDENT_AMBULATORY_CARE_PROVIDER_SITE_OTHER): Payer: Medicare Other | Admitting: Family Medicine

## 2019-05-09 ENCOUNTER — Other Ambulatory Visit: Payer: Self-pay

## 2019-05-09 VITALS — BP 130/78 | HR 104 | Temp 97.8°F | Ht 60.0 in | Wt 147.5 lb

## 2019-05-09 DIAGNOSIS — I10 Essential (primary) hypertension: Secondary | ICD-10-CM | POA: Diagnosis not present

## 2019-05-09 DIAGNOSIS — E7800 Pure hypercholesterolemia, unspecified: Secondary | ICD-10-CM | POA: Diagnosis not present

## 2019-05-09 DIAGNOSIS — Z0000 Encounter for general adult medical examination without abnormal findings: Secondary | ICD-10-CM | POA: Diagnosis not present

## 2019-05-09 DIAGNOSIS — E876 Hypokalemia: Secondary | ICD-10-CM | POA: Diagnosis not present

## 2019-05-09 DIAGNOSIS — M85859 Other specified disorders of bone density and structure, unspecified thigh: Secondary | ICD-10-CM | POA: Diagnosis not present

## 2019-05-09 MED ORDER — POTASSIUM CHLORIDE ER 10 MEQ PO TBCR: EXTENDED_RELEASE_TABLET | ORAL | 3 refills | Status: DC

## 2019-05-09 MED ORDER — TRIAMTERENE-HCTZ 37.5-25 MG PO TABS: 1.0000 | ORAL_TABLET | Freq: Every day | ORAL | 3 refills | Status: DC

## 2019-05-09 NOTE — Patient Instructions (Addendum)
For cholesterol  Avoid red meat/ fried foods/ egg yolks/ fatty breakfast meats/ butter, cheese and high fat dairy/ and shellfish    If you become a vegetarian- do make sure to get enough protein   Keep exercising and staying active and getting outdoors

## 2019-05-09 NOTE — Progress Notes (Signed)
Subjective:    Patient ID: Amy Herman, female    DOB: January 25, 1946, 73 y.o.   MRN: [id number]  HPI Here for amw and review of chronic medical problems  I have personally reviewed the Medicare Annual Wellness questionnaire and have noted 1. The patient's medical and social history 2. Their use of alcohol, tobacco or illicit drugs 3. Their current medications and supplements 4. The patient's functional ability including ADL's, fall risks, home safety risks and hearing or visual             impairment. 5. Diet and physical activities 6. Evidence for depression or mood disorders  The patients weight, height, BMI have been recorded in the chart and visual acuity is per eye clinic.  I have made referrals, counseling and provided education to the patient based review of the above and I have provided the pt with a written personalized care plan for preventive services. Reviewed and updated provider list, see scanned forms.  See scanned forms.  Routine anticipatory guidance given to patient.  See health maintenance. Colon cancer screening colonoscpy 1/19 with 5 y recall  Breast cancer screening 3/20  Self breast exam-no lumps or changes  Flu vaccine 04/10/19 Tetanus vaccine Tdap 1/19 Pneumovax completed Zoster vaccine   12/08 zostavax  Dexa 2/18- BMD in the normal range  Falls-none  Fractures-none  Supplements-vitamin D and calcium  Exercise - very active /looking for an elliptical machine   Advance directive-has living will and poa up to date  Cognitive function addressed- see scanned forms- and if abnormal then additional documentation follows.  Sharp/memory is great Takes care of all her own affairs   PMH and SH reviewed  Meds, vitals, and allergies reviewed.   ROS: See HPI.  Otherwise negative.    Weight : Wt Readings from Last 3 Encounters:  05/09/19 147 lb 8 oz (66.9 kg)  03/29/18 139 lb (63 kg)  11/09/17 145 lb 8 oz (66 kg)  not going to the gym due to pandemic   28.81 kg/m   Stress - her nephew has been in the hospital for months with heart failure   She was working part time at [employer]- few hours here and there   Hearing/vision:  Hearing Screening   125Hz  250Hz  500Hz  1000Hz  2000Hz  3000Hz  4000Hz  6000Hz  8000Hz   Right ear:   40 40 40  40    Left ear:   40 40 40  40    Vision Screening Comments: Pt had eye exam on 11/2018 with Dr. Sandra Cockayne at [facility] no vision changes   bp is stable today  No cp or palpitations or headaches or edema  No side effects to medicines  BP Readings from Last 3 Encounters:  05/09/19 130/78  03/29/18 126/78  11/09/17 136/74    Pulse Readings from Last 3 Encounters:  05/09/19 (!) 104  03/29/18 65  11/09/17 79   Lab Results  Component Value Date   CREATININE 0.85 05/02/2019   BUN 21 05/02/2019   NA 139 05/02/2019   K 3.3 (L) 05/02/2019   CL 103 05/02/2019   CO2 27 05/02/2019  has missed some K doses and her level was down   Lab Results  Component Value Date   ALT 12 05/02/2019   AST 19 05/02/2019   ALKPHOS 85 05/02/2019   BILITOT 0.5 05/02/2019     Hyperlipidemia Lab Results  Component Value Date   CHOL 261 (H) 05/02/2019   CHOL 219 (H) 03/20/2018   CHOL  213 (H) 02/19/2017   Lab Results  Component Value Date   HDL 53.40 05/02/2019   HDL 53.40 03/20/2018   HDL 56.90 02/19/2017   Lab Results  Component Value Date   LDLCALC 194 (H) 05/02/2019   LDLCALC 148 (H) 03/20/2018   LDLCALC 131 (H) 02/19/2017   Lab Results  Component Value Date   TRIG 68.0 05/02/2019   TRIG 88.0 03/20/2018   TRIG 129.0 02/19/2017   Lab Results  Component Value Date   CHOLHDL 5 05/02/2019   CHOLHDL 4 03/20/2018   CHOLHDL 4 02/19/2017   No results found for: LDLDIRECT Statin intolerant -severe muscle pain even with low dose crestor or lipitor  High LDL -it went up (she let her guard down for a while)-no longer eats fried stuff Now planning to become vegetarian  Went up - was not working out like  she was due to no gym and the heat   Declines cardiology f/u for cholesterol right now    Patient Active Problem List   Diagnosis Date Noted  . Medicare annual wellness visit, subsequent 05/09/2019  . Medicare annual wellness visit, initial 03/29/2018  . History of UTI 11/11/2017  . Encounter for screening mammogram for breast cancer 05/08/2016  . Estrogen deficiency 02/16/2016  . Contact with or exposure to communicable disease 02/16/2016  . Pre-operative cardiovascular examination 07/21/2013  . GERD (gastroesophageal reflux disease) 06/18/2013  . Varicose veins of lower extremities with other complications 0000000  . Lumbar disc disease 08/15/2011  . Joint pain 08/15/2011  . Osteopenia 01/31/2011  . Post-menopausal 01/31/2011  . Hypokalemia 01/31/2011  . Hyperlipidemia 03/24/2008  . ESSENTIAL HYPERTENSION, BENIGN 03/24/2008  . COLONIC POLYPS, HX OF 03/24/2008   Past Medical History:  Diagnosis Date  . Allergic rhinitis   . Allergy   . Bronchitis    more frequent in the past  . Cataract    "suspect per pt"  . Colon polyps    history of  . GERD (gastroesophageal reflux disease)   . Glaucoma    suspected  . Heart murmur   . Hyperlipidemia   . Hypertension   . Osteoarthritis of knee   . Osteopenia    Past Surgical History:  Procedure Laterality Date  . BUNIONECTOMY     bilateral-PIN LEFT GREAT TOE  . CARPAL TUNNEL RELEASE    . COLONOSCOPY    . FUNCTIONAL ENDOSCOPIC SINUS SURGERY     05-04-2011  . KNEE ARTHROSCOPY     right  . TUBAL LIGATION     bilateral  . VEIN SURGERY  2006   legs   Social History   Tobacco Use  . Smoking status: Never Smoker  . Smokeless tobacco: Never Used  . Tobacco comment: used to have secondary smoke exposure from husband  Substance Use Topics  . Alcohol use: No    Alcohol/week: 0.0 standard drinks  . Drug use: No   Family History  Problem Relation Age of Onset  . Cancer Mother        colon, lungs, liver  . Colon  cancer Mother 26  . Cancer Father        prostate  . Heart disease Father        A-fib  . Stroke Father        TIA's  . Alcohol abuse Brother   . Cancer Brother        pancreatic, smoker  . Diabetes Brother   . Diabetes Brother   . Stomach cancer Neg Hx   .  Esophageal cancer Neg Hx   . Rectal cancer Neg Hx   . Breast cancer Neg Hx    Allergies  Allergen Reactions  . Cefuroxime     Other reaction(s): RASH  . Cefuroxime Axetil     REACTION: bumps on tounge  . Cefuroxime Axetil Other (See Comments)    Other reaction(s): RASH Other reaction(s): RASH REACTION: bumps on tounge  . Clarithromycin Other (See Comments)    Other reaction(s): OTHER Other reaction(s): OTHER REACTION: GI Biaxin   . Clarithromycin Other (See Comments)    Burning sensation to stomach  . Levofloxacin   . Levofloxacin Other (See Comments)    Joint pains  . Lipitor [Atorvastatin] Other (See Comments)    Muscle pain  Muscle pain   . Losartan Other (See Comments)    cough cough  . Other     Other reaction(s): Unknown  . Rosuvastatin Other (See Comments)    REACTION: severe muscle pain REACTION: severe muscle pain  . Valsartan     Taken off from MD d/t a recall   Current Outpatient Medications on File Prior to Visit  Medication Sig Dispense Refill  . ALBUTEROL IN Inhale into the lungs as needed.    . Ascorbic Acid (VITAMIN C) 1000 MG tablet Take by mouth.    Marland Kitchen aspirin 81 MG EC tablet Take 81 mg by mouth daily.      . bimatoprost (LUMIGAN) 0.01 % SOLN     . Bisacodyl (DULCOLAX PO) Take by mouth. For colonoscopy 08-01-17    . calcium citrate-vitamin D (CITRACAL+D) 315-200 MG-UNIT per tablet Take 1 tablet by mouth daily.      . fexofenadine (ALLEGRA) 180 MG tablet Take 180 mg by mouth as needed for allergies or rhinitis.    . Garlic (ODOR FREE GARLIC) 123XX123 MG TABS Take 2 tablets by mouth 2 (two) times daily.     . meloxicam (MOBIC) 15 MG tablet Take 1 tablet by mouth  daily with food for pain 90  tablet 3  . metoCLOPramide (REGLAN) 5 MG tablet Take 1 tablet (5 mg total) by mouth as directed. Take 1 tablet 20-30 minutes before each prep dose 3 tablet 0  . Omega-3 Fatty Acids (OMEGA 3 PO) Take by mouth daily.    Marland Kitchen omeprazole (PRILOSEC) 20 MG capsule Take 20 mg by mouth as needed.    . Polyethylene Glycol 3350 (MIRALAX PO) Take by mouth. For colonoscopy 08-01-17    . Triamcinolone Acetonide (NASACORT AQ NA) Place into the nose daily.    . vitamin D, CHOLECALCIFEROL, 400 UNITS tablet Take 400 Units by mouth 2 (two) times daily.       No current facility-administered medications on file prior to visit.     Review of Systems  Constitutional: Negative for activity change, appetite change, fatigue, fever and unexpected weight change.  HENT: Negative for congestion, ear pain, rhinorrhea, sinus pressure and sore throat.   Eyes: Negative for pain, redness and visual disturbance.  Respiratory: Negative for cough, shortness of breath and wheezing.   Cardiovascular: Negative for chest pain and palpitations.  Gastrointestinal: Negative for abdominal pain, blood in stool, constipation and diarrhea.  Endocrine: Negative for polydipsia and polyuria.  Genitourinary: Negative for dysuria, frequency and urgency.  Musculoskeletal: Negative for arthralgias, back pain and myalgias.  Skin: Negative for pallor and rash.  Allergic/Immunologic: Negative for environmental allergies.  Neurological: Negative for dizziness, syncope and headaches.  Hematological: Negative for adenopathy. Does not bruise/bleed easily.  Psychiatric/Behavioral: Negative for decreased  concentration and dysphoric mood. The patient is not nervous/anxious.        Objective:   Physical Exam Constitutional:      General: She is not in acute distress.    Appearance: Normal appearance. She is well-developed and normal weight. She is not ill-appearing or diaphoretic.  HENT:     Head: Normocephalic and atraumatic.     Right Ear:  Tympanic membrane, ear canal and external ear normal.     Left Ear: Tympanic membrane, ear canal and external ear normal.     Nose: Nose normal. No congestion.     Mouth/Throat:     Mouth: Mucous membranes are moist.     Pharynx: Oropharynx is clear. No posterior oropharyngeal erythema.  Eyes:     General: No scleral icterus.    Extraocular Movements: Extraocular movements intact.     Conjunctiva/sclera: Conjunctivae normal.     Pupils: Pupils are equal, round, and reactive to light.  Neck:     Musculoskeletal: Normal range of motion and neck supple. No neck rigidity or muscular tenderness.     Thyroid: No thyromegaly.     Vascular: No carotid bruit or JVD.  Cardiovascular:     Rate and Rhythm: Normal rate and regular rhythm.     Pulses: Normal pulses.     Heart sounds: Normal heart sounds. No gallop.   Pulmonary:     Effort: Pulmonary effort is normal. No respiratory distress.     Breath sounds: Normal breath sounds. No wheezing.     Comments: Good air exch Chest:     Chest wall: No tenderness.  Abdominal:     General: Bowel sounds are normal. There is no distension or abdominal bruit.     Palpations: Abdomen is soft. There is no mass.     Tenderness: There is no abdominal tenderness.     Hernia: No hernia is present.  Genitourinary:    Comments: Breast exam: No mass, nodules, thickening, tenderness, bulging, retraction, inflamation, nipple discharge or skin changes noted.  No axillary or clavicular LA.     Musculoskeletal: Normal range of motion.        General: No tenderness.     Right lower leg: No edema.     Left lower leg: No edema.  Lymphadenopathy:     Cervical: No cervical adenopathy.  Skin:    General: Skin is warm and dry.     Coloration: Skin is not pale.     Findings: No erythema or rash.  Neurological:     Mental Status: She is alert. Mental status is at baseline.     Cranial Nerves: No cranial nerve deficit.     Motor: No abnormal muscle tone.      Coordination: Coordination normal.     Gait: Gait normal.     Deep Tendon Reflexes: Reflexes are normal and symmetric. Reflexes normal.  Psychiatric:        Mood and Affect: Mood normal.     Comments: Cheerful and talkative            Assessment & Plan:   Problem List Items Addressed This Visit      Cardiovascular and Mediastinum   ESSENTIAL HYPERTENSION, BENIGN    bp in fair control at this time  BP Readings from Last 1 Encounters:  05/09/19 130/78   No changes needed Most recent labs reviewed  Disc lifstyle change with low sodium diet and exercise        Relevant Medications  triamterene-hydrochlorothiazide (MAXZIDE-25) 37.5-25 MG tablet     Musculoskeletal and Integument   Osteopenia    Last dexa 2/18 showed nl bone density  No falls or fractures Taking ca and D  Good exercise        Other   Hyperlipidemia    Disc goals for lipids and reasons to control them Rev last labs with pt Rev low sat fat diet in detail Pt is intolerant to even small intermittent doses of crestor  LDL is up- diet was not good shortly before-she plans to return to better habits Declines cardiology/lipid clinic ref right now (may be candidate for pcyk9)      Relevant Medications   triamterene-hydrochlorothiazide (MAXZIDE-25) 37.5-25 MG tablet   Hypokalemia    Lab Results  Component Value Date   K 3.3 (L) 05/02/2019   Pt was not compliant with her K dosing Will get back on track      Medicare annual wellness visit, subsequent - Primary    Reviewed health habits including diet and exercise and skin cancer prevention Reviewed appropriate screening tests for age  Also reviewed health mt list, fam hx and immunization status , as well as social and family history   See HPI Labs reviewed  Has utd adv directive No cognitive concerns Good hearing screen  utd eye care with no recent changes in vision  Disc imp of compliance with her K dosing Also low cholesterol diet

## 2019-05-11 NOTE — Assessment & Plan Note (Signed)
Last dexa 2/18 showed nl bone density  No falls or fractures Taking ca and D  Good exercise

## 2019-05-11 NOTE — Assessment & Plan Note (Signed)
Reviewed health habits including diet and exercise and skin cancer prevention Reviewed appropriate screening tests for age  Also reviewed health mt list, fam hx and immunization status , as well as social and family history   See HPI Labs reviewed  Has utd adv directive No cognitive concerns Good hearing screen  utd eye care with no recent changes in vision  Disc imp of compliance with her K dosing Also low cholesterol diet

## 2019-05-11 NOTE — Assessment & Plan Note (Signed)
Lab Results  Component Value Date   K 3.3 (L) 05/02/2019   Pt was not compliant with her K dosing Will get back on track

## 2019-05-11 NOTE — Assessment & Plan Note (Signed)
Disc goals for lipids and reasons to control them Rev last labs with pt Rev low sat fat diet in detail Pt is intolerant to even small intermittent doses of crestor  LDL is up- diet was not good shortly before-she plans to return to better habits Declines cardiology/lipid clinic ref right now (may be candidate for pcyk9)

## 2019-05-11 NOTE — Assessment & Plan Note (Signed)
bp in fair control at this time  BP Readings from Last 1 Encounters:  05/09/19 130/78   No changes needed Most recent labs reviewed  Disc lifstyle change with low sodium diet and exercise

## 2019-05-13 DIAGNOSIS — J3089 Other allergic rhinitis: Secondary | ICD-10-CM | POA: Diagnosis not present

## 2019-05-20 DIAGNOSIS — J3089 Other allergic rhinitis: Secondary | ICD-10-CM | POA: Diagnosis not present

## 2019-06-03 DIAGNOSIS — J3089 Other allergic rhinitis: Secondary | ICD-10-CM | POA: Diagnosis not present

## 2019-07-08 ENCOUNTER — Ambulatory Visit: Payer: Medicare Other | Admitting: Family Medicine

## 2019-07-09 ENCOUNTER — Ambulatory Visit (INDEPENDENT_AMBULATORY_CARE_PROVIDER_SITE_OTHER): Payer: Medicare Other | Admitting: Family Medicine

## 2019-07-09 ENCOUNTER — Other Ambulatory Visit: Payer: Self-pay

## 2019-07-09 ENCOUNTER — Encounter: Payer: Self-pay | Admitting: Family Medicine

## 2019-07-09 ENCOUNTER — Telehealth: Payer: Self-pay

## 2019-07-09 DIAGNOSIS — J0140 Acute pansinusitis, unspecified: Secondary | ICD-10-CM

## 2019-07-09 DIAGNOSIS — R42 Dizziness and giddiness: Secondary | ICD-10-CM | POA: Insufficient documentation

## 2019-07-09 DIAGNOSIS — J349 Unspecified disorder of nose and nasal sinuses: Secondary | ICD-10-CM | POA: Insufficient documentation

## 2019-07-09 MED ORDER — PREDNISONE 10 MG PO TABS: ORAL_TABLET | ORAL | 0 refills | Status: DC

## 2019-07-09 NOTE — Assessment & Plan Note (Signed)
Intermittent, mild and fleeting  Nl neuro exam Suspect this is due to sinus pressure/inflammation  Will tx allergic sinusitis with prednisone  Update if not starting to improve in a week or if worsening

## 2019-07-09 NOTE — Progress Notes (Signed)
Subjective:    Patient ID: Amy Herman, female    DOB: Feb 25, 1946, 74 y.o.   MRN: [id number]  HPI Pt presents with c/o light headedness for one week    Wt Readings from Last 3 Encounters:  07/09/19 144 lb 9 oz (65.6 kg)  05/09/19 147 lb 8 oz (66.9 kg)  03/29/18 139 lb (63 kg)   28.23 kg/m  H/o HTN BP Readings from Last 3 Encounters:  07/09/19 (!) 148/82  05/09/19 130/78  03/29/18 126/78   Pulse Readings from Last 3 Encounters:  07/09/19 91  05/09/19 (!) 104  03/29/18 65   re check BP: 138/88    Light headed and also "nasal"  Thinks it may be from sinus congestion  Feels fine except for that  Has not missed work   It started when she was standing up cooking one am - around [employer] feeling  Does not feel like the room spins  Position change does not bring it on   She takes zyrtec 10 mg daily  flonase nasal spray every day (occ twice daily)  No headache - pressure around eyes  No nasal d/c - any mucous is clear  No cough   Is exposed to a lot of dust at work  [employer]    Patient Active Problem List   Diagnosis Date Noted  . Light headed 07/09/2019  . Sinusitis 07/09/2019  . Medicare annual wellness visit, subsequent 05/09/2019  . Medicare annual wellness visit, initial 03/29/2018  . History of UTI 11/11/2017  . Encounter for screening mammogram for breast cancer 05/08/2016  . Estrogen deficiency 02/16/2016  . Contact with or exposure to communicable disease 02/16/2016  . Pre-operative cardiovascular examination 07/21/2013  . GERD (gastroesophageal reflux disease) 06/18/2013  . Varicose veins of lower extremities with other complications 0000000  . Lumbar disc disease 08/15/2011  . Joint pain 08/15/2011  . Osteopenia 01/31/2011  . Post-menopausal 01/31/2011  . Hypokalemia 01/31/2011  . Hyperlipidemia 03/24/2008  . ESSENTIAL HYPERTENSION, BENIGN 03/24/2008  . COLONIC POLYPS, HX OF 03/24/2008   Past Medical History:  Diagnosis  Date  . Allergic rhinitis   . Allergy   . Bronchitis    more frequent in the past  . Cataract    "suspect per pt"  . Colon polyps    history of  . GERD (gastroesophageal reflux disease)   . Glaucoma    suspected  . Heart murmur   . Hyperlipidemia   . Hypertension   . Osteoarthritis of knee   . Osteopenia    Past Surgical History:  Procedure Laterality Date  . BUNIONECTOMY     bilateral-PIN LEFT GREAT TOE  . CARPAL TUNNEL RELEASE    . COLONOSCOPY    . FUNCTIONAL ENDOSCOPIC SINUS SURGERY     05-04-2011  . KNEE ARTHROSCOPY     right  . TUBAL LIGATION     bilateral  . VEIN SURGERY  2006   legs   Social History   Tobacco Use  . Smoking status: Never Smoker  . Smokeless tobacco: Never Used  . Tobacco comment: used to have secondary smoke exposure from husband  Substance Use Topics  . Alcohol use: No    Alcohol/week: 0.0 standard drinks  . Drug use: No   Family History  Problem Relation Age of Onset  . Cancer Mother        colon, lungs, liver  . Colon cancer Mother 68  . Cancer Father  prostate  . Heart disease Father        A-fib  . Stroke Father        TIA's  . Alcohol abuse Brother   . Cancer Brother        pancreatic, smoker  . Diabetes Brother   . Diabetes Brother   . Stomach cancer Neg Hx   . Esophageal cancer Neg Hx   . Rectal cancer Neg Hx   . Breast cancer Neg Hx    Allergies  Allergen Reactions  . Cefuroxime     Other reaction(s): RASH  . Cefuroxime Axetil     REACTION: bumps on tounge  . Cefuroxime Axetil Other (See Comments)    Other reaction(s): RASH Other reaction(s): RASH REACTION: bumps on tounge  . Clarithromycin Other (See Comments)    Other reaction(s): OTHER Other reaction(s): OTHER REACTION: GI Biaxin   . Clarithromycin Other (See Comments)    Burning sensation to stomach  . Levofloxacin   . Levofloxacin Other (See Comments)    Joint pains  . Lipitor [Atorvastatin] Other (See Comments)    Muscle pain  Muscle  pain   . Losartan Other (See Comments)    cough cough  . Other     Other reaction(s): Unknown  . Rosuvastatin Other (See Comments)    REACTION: severe muscle pain REACTION: severe muscle pain  . Valsartan     Taken off from MD d/t a recall   Current Outpatient Medications on File Prior to Visit  Medication Sig Dispense Refill  . ALBUTEROL IN Inhale into the lungs as needed.    . Ascorbic Acid (VITAMIN C) 1000 MG tablet Take by mouth.    Marland Kitchen aspirin 81 MG EC tablet Take 81 mg by mouth daily.      . bimatoprost (LUMIGAN) 0.01 % SOLN     . Bisacodyl (DULCOLAX PO) Take by mouth. For colonoscopy 08-01-17    . calcium citrate-vitamin D (CITRACAL+D) 315-200 MG-UNIT per tablet Take 1 tablet by mouth daily.      . Garlic (ODOR FREE GARLIC) 123XX123 MG TABS Take 2 tablets by mouth 2 (two) times daily.     . meloxicam (MOBIC) 15 MG tablet Take 1 tablet by mouth  daily with food for pain 90 tablet 3  . metoCLOPramide (REGLAN) 5 MG tablet Take 1 tablet (5 mg total) by mouth as directed. Take 1 tablet 20-30 minutes before each prep dose 3 tablet 0  . Omega-3 Fatty Acids (OMEGA 3 PO) Take by mouth daily.    Marland Kitchen omeprazole (PRILOSEC) 20 MG capsule Take 20 mg by mouth as needed.    . Polyethylene Glycol 3350 (MIRALAX PO) Take by mouth. For colonoscopy 08-01-17    . potassium chloride (KLOR-CON) 10 MEQ tablet Take 2 tablets (20 mEq  total) by mouth daily. 180 tablet 3  . Triamcinolone Acetonide (NASACORT AQ NA) Place into the nose daily.    Marland Kitchen triamterene-hydrochlorothiazide (MAXZIDE-25) 37.5-25 MG tablet Take 1 tablet by mouth daily. 90 tablet 3  . vitamin D, CHOLECALCIFEROL, 400 UNITS tablet Take 400 Units by mouth 2 (two) times daily.       No current facility-administered medications on file prior to visit.     Review of Systems  Constitutional: Positive for appetite change. Negative for fatigue and fever.  HENT: Positive for congestion, postnasal drip, sinus pressure, sore throat and tinnitus. Negative  for ear pain, nosebleeds and rhinorrhea.   Eyes: Positive for redness and itching. Negative for pain.  Respiratory: Negative  for cough, chest tightness, shortness of breath and wheezing.   Cardiovascular: Negative for chest pain.  Gastrointestinal: Negative for abdominal pain, diarrhea, nausea and vomiting.  Endocrine: Negative for polyuria.  Genitourinary: Negative for dysuria, frequency and urgency.  Musculoskeletal: Negative for arthralgias and myalgias.  Allergic/Immunologic: Negative for immunocompromised state.  Neurological: Positive for light-headedness and headaches. Negative for dizziness, tremors, seizures, syncope, facial asymmetry, speech difficulty, weakness and numbness.  Hematological: Negative for adenopathy. Does not bruise/bleed easily.  Psychiatric/Behavioral: Negative for dysphoric mood. The patient is not nervous/anxious.        Objective:   Physical Exam Constitutional:      General: She is not in acute distress.    Appearance: Normal appearance. She is well-developed. She is not ill-appearing.  HENT:     Head: Normocephalic and atraumatic.     Comments: Mild R frontal sinus tenderness    Right Ear: Tympanic membrane, ear canal and external ear normal.     Left Ear: Tympanic membrane, ear canal and external ear normal.     Nose: Congestion present. No rhinorrhea.     Comments: Nares are erythematous and boggy    Mouth/Throat:     Pharynx: Oropharynx is clear. No oropharyngeal exudate or posterior oropharyngeal erythema.     Comments: Clear pnd Eyes:     General: No scleral icterus.       Left eye: No discharge.     Pupils: Pupils are equal, round, and reactive to light.     Comments: Mild conj injection in R eye   Cardiovascular:     Rate and Rhythm: Normal rate and regular rhythm.  Pulmonary:     Effort: Pulmonary effort is normal. No respiratory distress.     Breath sounds: Normal breath sounds. No wheezing or rales.     Comments: Good air exch No  rales or rhonchi Musculoskeletal:     Cervical back: Normal range of motion and neck supple.  Lymphadenopathy:     Cervical: No cervical adenopathy.  Skin:    General: Skin is warm and dry.     Findings: No rash.  Neurological:     Mental Status: She is alert.     Cranial Nerves: No cranial nerve deficit.     Sensory: No sensory deficit.     Motor: No weakness.     Coordination: Coordination normal.     Gait: Gait normal.     Deep Tendon Reflexes: Reflexes normal.  Psychiatric:        Mood and Affect: Mood normal.           Assessment & Plan:   Problem List Items Addressed This Visit      Respiratory   Sinusitis    Suspect allergic inflammation  No signs of bacterial infection  Disc allergen avoidance  Continue flonase and zyrtec  30 mg prednisone taper given in effort to calm sinus inflammation  May need to consider allergy ref in the future Update if not starting to improve in a week or if worsening        Relevant Medications   predniSONE (DELTASONE) 10 MG tablet     Other   Light headed    Intermittent, mild and fleeting  Nl neuro exam Suspect this is due to sinus pressure/inflammation  Will tx allergic sinusitis with prednisone  Update if not starting to improve in a week or if worsening

## 2019-07-09 NOTE — Assessment & Plan Note (Signed)
Suspect allergic inflammation  No signs of bacterial infection  Disc allergen avoidance  Continue flonase and zyrtec  30 mg prednisone taper given in effort to calm sinus inflammation  May need to consider allergy ref in the future Update if not starting to improve in a week or if worsening

## 2019-07-09 NOTE — Telephone Encounter (Signed)
Pt already scheduled appt today at 3:45 with Dr Glori Bickers.

## 2019-07-09 NOTE — Telephone Encounter (Signed)
[facility] Night - Client Nonclinical Telephone Record [facility]urse Client [facility] Night - Client Client Site [facility] Physician Loura Pardon - MD Contact Type Call Who Is Calling Patient / Member / Family / Caregiver Caller Name Lendon Colonel Caller Phone Number [phone] Patient Name Amy Herman Patient DOB 13-Oct-1945 Call Type Message Only Information Provided Reason for Call Request to Schedule Office Appointment Initial Comment The caller needs to make an appointment. Additional Comment Disp. Time Disposition Final User 07/08/2019 5:10:58 PM General Information Provided Yes Carlis Stable Call Closed By: Carlis Stable Transaction Date/Time: 07/08/2019 5:04:32 PM (ET)

## 2019-07-09 NOTE — Patient Instructions (Addendum)
I think your episodic light headed feeling is related to sinus congestion   Continue your zyrtec  Continue the flonase Avoid allergens when you can   Nasal saline is ok as well    Take prednisone as directed for sinus inflammation  If may make you feel hyper and hungry   If you develop sinus pain or colored nasal discharge or other new symptoms let us know

## 2019-07-17 DIAGNOSIS — J3089 Other allergic rhinitis: Secondary | ICD-10-CM | POA: Diagnosis not present

## 2019-07-18 ENCOUNTER — Telehealth: Payer: Self-pay | Admitting: Family Medicine

## 2019-07-18 DIAGNOSIS — R42 Dizziness and giddiness: Secondary | ICD-10-CM

## 2019-07-18 NOTE — Telephone Encounter (Signed)
How are her allergy /sinus symptoms?   Any actual fall or head injury?  Any focal weakness/numbness or trouble speaking?

## 2019-07-18 NOTE — Telephone Encounter (Signed)
Called pt and she said that she really thinks it's related to her sinus issues she does have a lot of nasal congestion and pressure she said it makes her "head feel heavy" pt did recently go to her allergy doctor and got her routine allergy shots. Pt said that the only thing that the prednisone helped was the ringing in her ears that has stopped but everything else is still going. Pt said she is having bouts of lightheadedness pt said it's not like the room is spinning it's just she feels really lightheaded. Pt said that it's every now and then it's not a constant issue but she woke up this morning and it was pretty bad. She had to sit on her bed for a while before she could start waking or moving. Pt said she did finish all of the prednisone and it didn't help anything. Pt said she has no other sxs, no fall or head injury, no focal weakness/numbness and no trouble talking, no fever, no coughing or any other sxs

## 2019-07-18 NOTE — Telephone Encounter (Signed)
Rosaria Ferries called and has scheduled appt with ENT

## 2019-07-18 NOTE — Telephone Encounter (Signed)
Patient was seen on 07/09/19 for lightheadedness.  Patient finished the prednisone today.  Patient said she hasn't  had any improvement. Patient said she still has lightheadedness on and off.  Patient said it was worse this morning when she got up, but it's not to a point where she's falling and the room doesn't spin.  Patient uses [facility].

## 2019-07-18 NOTE — Telephone Encounter (Signed)
I placed a referral to ENT Let her know  I will route to [facility]

## 2019-08-04 DIAGNOSIS — H401131 Primary open-angle glaucoma, bilateral, mild stage: Secondary | ICD-10-CM | POA: Diagnosis not present

## 2019-08-06 DIAGNOSIS — J3489 Other specified disorders of nose and nasal sinuses: Secondary | ICD-10-CM | POA: Diagnosis not present

## 2019-08-06 DIAGNOSIS — J301 Allergic rhinitis due to pollen: Secondary | ICD-10-CM | POA: Diagnosis not present

## 2019-08-06 DIAGNOSIS — R42 Dizziness and giddiness: Secondary | ICD-10-CM | POA: Diagnosis not present

## 2019-08-12 DIAGNOSIS — J3089 Other allergic rhinitis: Secondary | ICD-10-CM | POA: Diagnosis not present

## 2019-08-15 DIAGNOSIS — Z23 Encounter for immunization: Secondary | ICD-10-CM | POA: Diagnosis not present

## 2019-08-20 DIAGNOSIS — J0140 Acute pansinusitis, unspecified: Secondary | ICD-10-CM | POA: Diagnosis not present

## 2019-08-22 ENCOUNTER — Other Ambulatory Visit: Payer: Self-pay | Admitting: Family Medicine

## 2019-08-22 DIAGNOSIS — J3089 Other allergic rhinitis: Secondary | ICD-10-CM | POA: Diagnosis not present

## 2019-08-22 DIAGNOSIS — Z1231 Encounter for screening mammogram for malignant neoplasm of breast: Secondary | ICD-10-CM

## 2019-08-26 DIAGNOSIS — J3089 Other allergic rhinitis: Secondary | ICD-10-CM | POA: Diagnosis not present

## 2019-08-27 DIAGNOSIS — J3089 Other allergic rhinitis: Secondary | ICD-10-CM | POA: Diagnosis not present

## 2019-09-02 DIAGNOSIS — J301 Allergic rhinitis due to pollen: Secondary | ICD-10-CM | POA: Diagnosis not present

## 2019-09-02 DIAGNOSIS — J328 Other chronic sinusitis: Secondary | ICD-10-CM | POA: Diagnosis not present

## 2019-09-02 DIAGNOSIS — R42 Dizziness and giddiness: Secondary | ICD-10-CM | POA: Diagnosis not present

## 2019-09-04 DIAGNOSIS — J3089 Other allergic rhinitis: Secondary | ICD-10-CM | POA: Diagnosis not present

## 2019-09-08 ENCOUNTER — Telehealth: Payer: Self-pay | Admitting: Family Medicine

## 2019-09-08 DIAGNOSIS — R42 Dizziness and giddiness: Secondary | ICD-10-CM

## 2019-09-08 NOTE — Telephone Encounter (Signed)
Patient is requesting a call back   Patient stated she seen you in Feb for light headiness.  Patient stated she was needing to see an ENT doctor. She seen him twice already and he has not see anything or anything going on with her Sinuses.  Patient said she is still having light headiness.  She would like to know what you recommend next.     Patient stated she had her first vaccine on feb 13th.

## 2019-09-08 NOTE — Telephone Encounter (Signed)
Since she is still feeling dizzy, I will place a neurology order and send to [facility] Thanks for letting me know

## 2019-09-09 NOTE — Telephone Encounter (Signed)
I will see her then  

## 2019-09-09 NOTE — Telephone Encounter (Signed)
Spoke with pt and she is okay proceeding with neuro referral if Dr. Glori Bickers wants her too but she did want me to let Dr. Glori Bickers know what her ENT doctor advised her. After not finding a cause to her dizziness he said that she may benefit from a CT but usually insurance doesn't cover a CT unless she's had a xray of her sinus 1st. ENT did advise her they could do the xray if her sxs continue but 1st ENT wanted her to f/u with PCP to discuss if there is other issues that may be causing dizziness that's not ENT related. He said that sometimes meds could be the cause of dizziness. Some times blood sugar or her labs being "off" could cause it. Pt said she knows she's not dehydrated because she drinks 6-8 cups of water a day 98-16oz each) but she questions if her drinking a lot of water has depleted her electrolytes or other labs like potassium. Pt had a really bad day dealing with dizziness so she drank a lot of Gatorade yesterday and today she hasn't had any dizziness but she does have it off and on. Pt also said she started eating snacks in between meals to make sure she's eating enough, she has also increased eating almonds and nuts and foods high in potassium just to make sure her potassium isn't low. Pt questions if she should get labs done or if she should do something else before she sees a neurologist and before she goes back to ENT to get a xray, please advise

## 2019-09-09 NOTE — Telephone Encounter (Signed)
Please set her up for a visit with me (we can do labs then after talking some more)    I advise she go forward with the xray as well

## 2019-09-09 NOTE — Telephone Encounter (Signed)
Pt notified and appt scheduled for tomorrow at 12pm, pt said she did forget to mention that her ENT also said she may need to wear a Holter monitor because sometimes when she feels dizzy she also has some palpitations. FYI to PCP

## 2019-09-10 ENCOUNTER — Encounter: Payer: Self-pay | Admitting: Family Medicine

## 2019-09-10 ENCOUNTER — Ambulatory Visit (INDEPENDENT_AMBULATORY_CARE_PROVIDER_SITE_OTHER): Payer: Medicare Other | Admitting: Family Medicine

## 2019-09-10 ENCOUNTER — Other Ambulatory Visit: Payer: Self-pay

## 2019-09-10 VITALS — BP 124/78 | HR 89 | Temp 98.6°F | Ht 60.0 in | Wt 140.3 lb

## 2019-09-10 DIAGNOSIS — I10 Essential (primary) hypertension: Secondary | ICD-10-CM

## 2019-09-10 DIAGNOSIS — R42 Dizziness and giddiness: Secondary | ICD-10-CM

## 2019-09-10 LAB — COMPREHENSIVE METABOLIC PANEL
ALT: 12 U/L (ref 0–35)
AST: 15 U/L (ref 0–37)
Albumin: 3.9 g/dL (ref 3.5–5.2)
Alkaline Phosphatase: 81 U/L (ref 39–117)
BUN: 16 mg/dL (ref 6–23)
CO2: 29 mEq/L (ref 19–32)
Calcium: 9.5 mg/dL (ref 8.4–10.5)
Chloride: 106 mEq/L (ref 96–112)
Creatinine, Ser: 0.79 mg/dL (ref 0.40–1.20)
GFR: 71.03 mL/min (ref >60.00)
Glucose, Bld: 102 mg/dL — ABNORMAL HIGH (ref 70–99)
Potassium: 3.5 mEq/L (ref 3.5–5.1)
Sodium: 140 mEq/L (ref 135–145)
Total Bilirubin: 0.5 mg/dL (ref 0.2–1.2)
Total Protein: 7 g/dL (ref 6.0–8.3)

## 2019-09-10 LAB — CBC WITH DIFFERENTIAL/PLATELET
Basophils Absolute: 0.1 10*3/uL (ref 0.0–0.1)
Basophils Relative: 2.2 % (ref 0.0–3.0)
Eosinophils Absolute: 0.1 10*3/uL (ref 0.0–0.7)
Eosinophils Relative: 2.6 % (ref 0.0–5.0)
HCT: 39.6 % (ref 36.0–46.0)
Hemoglobin: 13.4 g/dL (ref 12.0–15.0)
Lymphocytes Relative: 36 % (ref 12.0–46.0)
Lymphs Abs: 1.8 10*3/uL (ref 0.7–4.0)
MCHC: 33.8 g/dL (ref 30.0–36.0)
MCV: 89.6 fl (ref 78.0–100.0)
Monocytes Absolute: 0.5 10*3/uL (ref 0.1–1.0)
Monocytes Relative: 9.4 % (ref 3.0–12.0)
Neutro Abs: 2.5 10*3/uL (ref 1.4–7.7)
Neutrophils Relative %: 49.8 % (ref 43.0–77.0)
Platelets: 283 10*3/uL (ref 150.0–400.0)
RBC: 4.42 Mil/uL (ref 3.87–5.11)
RDW: 12.6 % (ref 11.5–15.5)
WBC: 5 10*3/uL (ref 4.0–10.5)

## 2019-09-10 NOTE — Progress Notes (Signed)
Subjective:    Patient ID: Amy Herman, female    DOB: [birth year], 74 y.o.   MRN: [id number]  HPI Pt presents for f/u of acute on chronic dizziness   Wt Readings from Last 3 Encounters:  09/10/19 140 lb 5 oz (63.6 kg)  07/09/19 144 lb 9 oz (65.6 kg)  05/09/19 147 lb 8 oz (66.9 kg)   27.40 kg/m    Dizziness tends to occur in ams after showering before breakfast will start to feel a little light headed (this makes her nervous) Eat breakfast around 10 am (drinks water also)  Rest of the day - drinks water regularly  Continues to experience light headed episodes (mild and on /off)  By end of the day she is good (lying /sitting)   Tended to skip lunch  Eats a regular dinner (she eats very healthy) and does not snack  All day Sunday it was worse than usual  She became afraid to take her medication   Has not identified any triggers  Position change is fine  Exertion is ok  Getting up -no problems (she has always avoided getting up fast)  Some stress- trying to sell an estate  Some family issues  Very seldom headache - then it is from sinuses and that is rare   No falls No head injuries  No syncope   ENT did not find a cause of dizziness and said that ins would not likely pay for CT scan of sinuses (was planning xrays first) Wanted to r/o other causes of dizziness    She is drinking more water  Also eating snacks in between meals with protein    She has had low K in the past Lab Results  Component Value Date   K 3.3 (L) 05/02/2019   Takes klor con 10 meq   We suggested neuro referral and that was placed   BP Readings from Last 3 Encounters:  09/10/19 124/78  07/09/19 138/88  05/09/19 130/78   Pulse Readings from Last 3 Encounters:  09/10/19 89  07/09/19 91  05/09/19 (!) 104   Patient Active Problem List   Diagnosis Date Noted  . Dizziness 07/09/2019  . Medicare annual wellness visit, subsequent 05/09/2019  . Medicare annual wellness visit,  initial 03/29/2018  . History of UTI 11/11/2017  . Encounter for screening mammogram for breast cancer 05/08/2016  . Estrogen deficiency 02/16/2016  . Contact with or exposure to communicable disease 02/16/2016  . Pre-operative cardiovascular examination 07/21/2013  . GERD (gastroesophageal reflux disease) 06/18/2013  . Varicose veins of lower extremities with other complications 0000000  . Lumbar disc disease 08/15/2011  . Joint pain 08/15/2011  . Osteopenia 01/31/2011  . Post-menopausal 01/31/2011  . Hypokalemia 01/31/2011  . Hyperlipidemia 03/24/2008  . ESSENTIAL HYPERTENSION, BENIGN 03/24/2008  . COLONIC POLYPS, HX OF 03/24/2008   Past Medical History:  Diagnosis Date  . Allergic rhinitis   . Allergy   . Bronchitis    more frequent in the past  . Cataract    "suspect per pt"  . Colon polyps    history of  . GERD (gastroesophageal reflux disease)   . Glaucoma    suspected  . Heart murmur   . Hyperlipidemia   . Hypertension   . Osteoarthritis of knee   . Osteopenia    Past Surgical History:  Procedure Laterality Date  . BUNIONECTOMY     bilateral-PIN LEFT GREAT TOE  . CARPAL TUNNEL RELEASE    . COLONOSCOPY    .  FUNCTIONAL ENDOSCOPIC SINUS SURGERY     05-04-2011  . KNEE ARTHROSCOPY     right  . TUBAL LIGATION     bilateral  . VEIN SURGERY  2006   legs   Social History   Tobacco Use  . Smoking status: Never Smoker  . Smokeless tobacco: Never Used  . Tobacco comment: used to have secondary smoke exposure from husband  Substance Use Topics  . Alcohol use: No    Alcohol/week: 0.0 standard drinks  . Drug use: No   Family History  Problem Relation Age of Onset  . Cancer Mother        colon, lungs, liver  . Colon cancer Mother 25  . Cancer Father        prostate  . Heart disease Father        A-fib  . Stroke Father        TIA's  . Alcohol abuse Brother   . Cancer Brother        pancreatic, smoker  . Diabetes Brother   . Diabetes Brother   .  Stomach cancer Neg Hx   . Esophageal cancer Neg Hx   . Rectal cancer Neg Hx   . Breast cancer Neg Hx    Allergies  Allergen Reactions  . Cefuroxime     Other reaction(s): RASH  . Cefuroxime Axetil     REACTION: bumps on tounge  . Cefuroxime Axetil Other (See Comments)    Other reaction(s): RASH Other reaction(s): RASH REACTION: bumps on tounge  . Clarithromycin Other (See Comments)    Other reaction(s): OTHER Other reaction(s): OTHER REACTION: GI Biaxin   . Clarithromycin Other (See Comments)    Burning sensation to stomach  . Levofloxacin   . Levofloxacin Other (See Comments)    Joint pains  . Lipitor [Atorvastatin] Other (See Comments)    Muscle pain  Muscle pain   . Losartan Other (See Comments)    cough cough  . Other     Other reaction(s): Unknown  . Rosuvastatin Other (See Comments)    REACTION: severe muscle pain REACTION: severe muscle pain  . Valsartan     Taken off from MD d/t a recall   Current Outpatient Medications on File Prior to Visit  Medication Sig Dispense Refill  . ALBUTEROL IN Inhale into the lungs as needed.    . Ascorbic Acid (VITAMIN C) 1000 MG tablet Take by mouth.    Marland Kitchen aspirin 81 MG EC tablet Take 81 mg by mouth daily.      . bimatoprost (LUMIGAN) 0.01 % SOLN     . Bisacodyl (DULCOLAX PO) Take by mouth. For colonoscopy 08-01-17    . calcium citrate-vitamin D (CITRACAL+D) 315-200 MG-UNIT per tablet Take 1 tablet by mouth daily.      . Garlic (ODOR FREE GARLIC) 123XX123 MG TABS Take 2 tablets by mouth 2 (two) times daily.     . montelukast (SINGULAIR) 10 MG tablet Take 10 mg by mouth at bedtime.    . Omega-3 Fatty Acids (OMEGA 3 PO) Take by mouth daily.    . Polyethylene Glycol 3350 (MIRALAX PO) Take by mouth. For colonoscopy 08-01-17    . potassium chloride (KLOR-CON) 10 MEQ tablet Take 2 tablets (20 mEq  total) by mouth daily. 180 tablet 3  . Triamcinolone Acetonide (NASACORT AQ NA) Place into the nose daily.    Marland Kitchen  triamterene-hydrochlorothiazide (MAXZIDE-25) 37.5-25 MG tablet Take 1 tablet by mouth daily. 90 tablet 3  . vitamin D,  CHOLECALCIFEROL, 400 UNITS tablet Take 400 Units by mouth 2 (two) times daily.       No current facility-administered medications on file prior to visit.     Review of Systems  Constitutional: Negative for activity change, appetite change, fatigue, fever and unexpected weight change.  HENT: Negative for congestion, ear pain, rhinorrhea, sinus pressure and sore throat.   Eyes: Negative for pain, redness and visual disturbance.  Respiratory: Negative for cough, shortness of breath and wheezing.   Cardiovascular: Negative for chest pain and palpitations.  Gastrointestinal: Negative for abdominal pain, blood in stool, constipation and diarrhea.  Endocrine: Negative for polydipsia and polyuria.  Genitourinary: Negative for dysuria, frequency and urgency.  Musculoskeletal: Negative for arthralgias, back pain and myalgias.  Skin: Negative for pallor and rash.  Allergic/Immunologic: Negative for environmental allergies.  Neurological: Positive for light-headedness. Negative for dizziness, syncope, facial asymmetry, weakness and headaches.  Hematological: Negative for adenopathy. Does not bruise/bleed easily.  Psychiatric/Behavioral: Negative for decreased concentration and dysphoric mood. The patient is not nervous/anxious.        Some stressors  Mood is good       Objective:   Physical Exam Constitutional:      General: She is not in acute distress.    Appearance: Normal appearance. She is well-developed and normal weight. She is not ill-appearing or diaphoretic.  HENT:     Head: Normocephalic and atraumatic.     Right Ear: Tympanic membrane, ear canal and external ear normal.     Left Ear: Tympanic membrane, ear canal and external ear normal.     Nose: No rhinorrhea.     Mouth/Throat:     Mouth: Mucous membranes are moist.     Pharynx: No oropharyngeal exudate.    Eyes:     General: No scleral icterus.       Right eye: No discharge.        Left eye: No discharge.     Conjunctiva/sclera: Conjunctivae normal.     Pupils: Pupils are equal, round, and reactive to light.     Comments: No nystagmus  Neck:     Thyroid: No thyromegaly.     Vascular: No carotid bruit or JVD.     Trachea: No tracheal deviation.  Cardiovascular:     Rate and Rhythm: Normal rate and regular rhythm.     Heart sounds: Normal heart sounds. No murmur.     Comments: No change in bp with orthostatic change  Pulmonary:     Effort: Pulmonary effort is normal. No respiratory distress.     Breath sounds: Normal breath sounds. No wheezing or rales.  Abdominal:     General: Bowel sounds are normal. There is no distension.     Palpations: Abdomen is soft. There is no mass.     Tenderness: There is no abdominal tenderness.  Musculoskeletal:        General: No tenderness.     Cervical back: Full passive range of motion without pain, normal range of motion and neck supple. No rigidity or tenderness.     Right lower leg: No edema.     Left lower leg: No edema.  Lymphadenopathy:     Cervical: No cervical adenopathy.  Skin:    General: Skin is warm and dry.     Coloration: Skin is not pale.     Findings: No erythema or rash.  Neurological:     Mental Status: She is alert and oriented to person, place, and time.  Cranial Nerves: No cranial nerve deficit.     Sensory: Sensation is intact. No sensory deficit.     Motor: Motor function is intact. No tremor, atrophy or abnormal muscle tone.     Coordination: Coordination is intact. Romberg sign negative. Coordination normal. Finger-Nose-Finger Test normal. Rapid alternating movements normal.     Gait: Gait is intact. Gait and tandem walk normal.     Deep Tendon Reflexes: Reflexes are normal and symmetric.     Comments: No focal cerebellar signs   Psychiatric:        Behavior: Behavior normal.        Thought Content: Thought  content normal.           Assessment & Plan:   Problem List Items Addressed This Visit      Cardiovascular and Mediastinum   ESSENTIAL HYPERTENSION, BENIGN - Primary   Relevant Orders   Comprehensive metabolic panel   CBC with Differential/Platelet     Other   Dizziness    Originally thought to be due to sinus/ear issues  Has seen ENT and they suspect another cause  Nl exam and nl vitals and orthostatics today  Disc lifestyle habits re: diet and exercise and water intake  Lab today incl cmet (with fasting glucose) and cbc  Ref was placed to neruo-will move forward with that  She plans to also get sinus xray as planned  Enc to use caution re: falls and not to drive when dizzy       Relevant Orders   Comprehensive metabolic panel   CBC with Differential/Platelet

## 2019-09-10 NOTE — Patient Instructions (Signed)
Take care of yourself Drink water and eat regular meals with protein   Labs today  Make note if you discover and triggers to dizziness   Exam and vital signs are normal today   Get your sinus xray   We will also proceed with neurology evaluation as well   If symptoms suddenly worsen or change let us know

## 2019-09-10 NOTE — Assessment & Plan Note (Signed)
Originally thought to be due to sinus/ear issues  Has seen ENT and they suspect another cause  Nl exam and nl vitals and orthostatics today  Disc lifestyle habits re: diet and exercise and water intake  Lab today incl cmet (with fasting glucose) and cbc  Ref was placed to [facility]uo-will move forward with that  She plans to also get sinus xray as planned  Enc to use caution re: falls and not to drive when dizzy

## 2019-09-15 ENCOUNTER — Other Ambulatory Visit (HOSPITAL_COMMUNITY): Payer: Self-pay | Admitting: Otolaryngology

## 2019-09-15 ENCOUNTER — Other Ambulatory Visit: Payer: Self-pay | Admitting: Otolaryngology

## 2019-09-15 DIAGNOSIS — R42 Dizziness and giddiness: Secondary | ICD-10-CM

## 2019-09-17 DIAGNOSIS — Z23 Encounter for immunization: Secondary | ICD-10-CM | POA: Diagnosis not present

## 2019-09-25 ENCOUNTER — Ambulatory Visit
Admission: RE | Admit: 2019-09-25 | Discharge: 2019-09-25 | Disposition: A | Discharge status: Home / Self Care | Payer: Medicare Other | Source: Ambulatory Visit | Attending: Otolaryngology | Admitting: Otolaryngology

## 2019-09-25 ENCOUNTER — Other Ambulatory Visit: Payer: Self-pay

## 2019-09-25 DIAGNOSIS — R42 Dizziness and giddiness: Secondary | ICD-10-CM

## 2019-09-25 MED ORDER — GADOBUTROL 1 MMOL/ML IV SOLN
6.0000 mL | Freq: Once | INTRAVENOUS | Status: AC | PRN
  Administered 2019-09-25: 6 mL via INTRAVENOUS

## 2019-09-27 ENCOUNTER — Other Ambulatory Visit: Payer: Self-pay | Admitting: Family Medicine

## 2019-10-06 DIAGNOSIS — R0982 Postnasal drip: Secondary | ICD-10-CM | POA: Diagnosis not present

## 2019-10-06 DIAGNOSIS — J321 Chronic frontal sinusitis: Secondary | ICD-10-CM | POA: Diagnosis not present

## 2019-10-06 DIAGNOSIS — J328 Other chronic sinusitis: Secondary | ICD-10-CM | POA: Diagnosis not present

## 2019-10-07 DIAGNOSIS — J3089 Other allergic rhinitis: Secondary | ICD-10-CM | POA: Diagnosis not present

## 2019-10-09 ENCOUNTER — Other Ambulatory Visit: Payer: Self-pay | Admitting: *Deleted

## 2019-10-09 MED ORDER — TRIAMTERENE-HCTZ 37.5-25 MG PO TABS: 1.0000 | ORAL_TABLET | Freq: Every day | ORAL | 1 refills | Status: DC

## 2019-10-14 DIAGNOSIS — J3089 Other allergic rhinitis: Secondary | ICD-10-CM | POA: Diagnosis not present

## 2019-10-21 DIAGNOSIS — J3089 Other allergic rhinitis: Secondary | ICD-10-CM | POA: Diagnosis not present

## 2019-10-22 ENCOUNTER — Ambulatory Visit: Payer: Medicare Other | Admitting: Neurology

## 2019-10-28 NOTE — Progress Notes (Signed)
[facility]    Provider:  Dr Jaynee Eagles Requesting Provider: Glori Bickers, Wynelle Fanny, MD Primary Care Provider:  Abner Greenspan, MD  CC:  Resolved dizziness  HPI:  Amy Herman is a 74 y.o. female here as requested by Abner Greenspan, MD for dizziness.  She has a past medical history of hypertension, lumbar disc disease, osteopenia, hyperlipidemia, hyperkalemia, dizziness, heart murmur, glaucoma, bronchitis and allergies.  I reviewed Dr. Alba Cory notes, dizziness was originally thought to be due to sinus/ear issues, she seen ENT and they suspect a different cause, normal examination and normal vitals and orthostatics were completed in the office, she was educated on diet and exercise and water intake, she was also planning to get sinus x-rays.  It appears it started in January in the setting of sinusitis, she was feeling more lightheaded and thought to be due to sinus pressure/inflammation.  Dr. Alba Cory notes from November 2020 did not show complaints of dizziness or lightheadedness at that time.  She is here alone, symptoms have resolved, treating the sinus infection helps, more dizziness and lightheadedness as opposed to room spinning. She was given prednisone and antibiotics and she is much better now. She eats healthy and works out. We reviewed the images together and I showed her the sinuses on the imaging. She still has a little lightheadedness but much better. She worked at [employer] for close to 84 years. She felt lightheaded and dizzy, it started in November of last time, no know illness or inciting event, she was stressed due to covid and feels that may have had something to do with it. No vertigo. She never fell.She felt like she was on a boat worse in the mornings and sometimes in the evenings, sporadic, it was very mild but it scared her tremendously and she would have some anxiety over it which made it worse. It was stable for a few months. She still feels some sinus heaviness but all  the symptoms are resolved. Flonase helped. Also hydration helped. No other focal neurologic deficits, associated symptoms, inciting events or modifiable factors.  She has tenderness and a cord in her right calf, tender, no swelling, started last week, today worse and tender, no rash, no lesions, no weakness.   Reviewed notes, labs and imaging from outside physicians, which showed:  I personally reviewed MRI images of the brain which was essentially normal for age, and agree with the following:  Postsurgical changes from bilateral ethmoidectomy and antrostomy. Obliteration of the left frontal sinus with T1 and T2 hyperintense content, suggestive of calcification/ossification. The orbits are preserved.  Cbc and cmp unremarkable  IMPRESSION: 1. No acute intracranial abnormality. 2. No cerebellopontine angle mass or internal auditory canal lesion. 3. Mild chronic small vessel ischemic changes.   Review of Systems: Patient complains of symptoms per HPI as well as the following symptoms: sinus problems, gerd. Pertinent negatives and positives per HPI. All others negative.   Social History   Socioeconomic History  . Marital status: Widowed    Spouse name: Not on file  . Number of children: Not on file  . Years of education: Not on file  . Highest education level: Not on file  Occupational History  . Occupation: [employer]    Comment: over 30 years  Tobacco Use  . Smoking status: Never Smoker  . Smokeless tobacco: Never Used  . Tobacco comment: used to have secondary smoke exposure from husband  Substance and Sexual Activity  . Alcohol use: No  Alcohol/week: 0.0 standard drinks  . Drug use: No  . Sexual activity: Never    Birth control/protection: Post-menopausal  Other Topics Concern  . Not on file  Social History Narrative   Retired from [employer] in 2014.    Works for [employer] alone   Works out    Right handed   Caffeine: none   Social Determinants of Adult nurse Strain:   . Difficulty of Paying Living Expenses:   Food Insecurity:   . Worried About Charity fundraiser in the Last Year:   . Arboriculturist in the Last Year:   Transportation Needs:   . Film/video editor (Medical):   Marland Kitchen Lack of Transportation (Non-Medical):   Physical Activity:   . Days of Exercise per Week:   . Minutes of Exercise per Session:   Stress:   . Feeling of Stress :   Social Connections:   . Frequency of Communication with Friends and Family:   . Frequency of Social Gatherings with Friends and Family:   . Attends Religious Services:   . Active Member of Clubs or Organizations:   . Attends Archivist Meetings:   Marland Kitchen Marital Status:   Intimate Partner Violence:   . Fear of Current or Ex-Partner:   . Emotionally Abused:   Marland Kitchen Physically Abused:   . Sexually Abused:     Family History  Problem Relation Age of Onset  . Cancer Mother        colon, lungs, liver  . Colon cancer Mother 15  . Cancer Father        prostate  . Heart disease Father        A-fib  . Stroke Father        TIA's  . Alcohol abuse Brother   . Cancer Brother        pancreatic, smoker  . Diabetes Brother   . Diabetes Brother   . Stomach cancer Neg Hx   . Esophageal cancer Neg Hx   . Rectal cancer Neg Hx   . Breast cancer Neg Hx     Past Medical History:  Diagnosis Date  . Allergic rhinitis   . Allergy   . Bronchitis    more frequent in the past  . Cataract    "suspect per pt"  . Colon polyps    history of  . GERD (gastroesophageal reflux disease)   . Glaucoma    suspected  . Heart murmur   . Hyperlipidemia   . Hypertension   . Osteoarthritis of knee   . Osteopenia     Patient Active Problem List   Diagnosis Date Noted  . Dizziness 07/09/2019  . Sinus disease 07/09/2019  . Medicare annual wellness visit, subsequent 05/09/2019  . Medicare annual wellness visit, initial 03/29/2018  . History of UTI 11/11/2017  . Encounter for  screening mammogram for breast cancer 05/08/2016  . Estrogen deficiency 02/16/2016  . Contact with or exposure to communicable disease 02/16/2016  . Pre-operative cardiovascular examination 07/21/2013  . GERD (gastroesophageal reflux disease) 06/18/2013  . Varicose veins of lower extremities with other complications 0000000  . Lumbar disc disease 08/15/2011  . Joint pain 08/15/2011  . Osteopenia 01/31/2011  . Post-menopausal 01/31/2011  . Hypokalemia 01/31/2011  . Hyperlipidemia 03/24/2008  . ESSENTIAL HYPERTENSION, BENIGN 03/24/2008  . COLONIC POLYPS, HX OF 03/24/2008    Past Surgical History:  Procedure Laterality Date  . BUNIONECTOMY  bilateral-PIN LEFT GREAT TOE  . CARPAL TUNNEL RELEASE    . COLONOSCOPY    . FUNCTIONAL ENDOSCOPIC SINUS SURGERY     05-04-2011  . HYSTERECTOMY     only removed uterus  . KNEE ARTHROSCOPY     right  . TUBAL LIGATION     bilateral  . VEIN SURGERY  2006   legs    Current Outpatient Medications  Medication Sig Dispense Refill  . ALBUTEROL IN Inhale into the lungs as needed.    . Ascorbic Acid (VITAMIN C) 1000 MG tablet Take by mouth.    Marland Kitchen aspirin 81 MG EC tablet Take 81 mg by mouth daily.      . bimatoprost (LUMIGAN) 0.01 % SOLN     . Bisacodyl (DULCOLAX PO) Take by mouth. For colonoscopy 08-01-17    . calcium citrate-vitamin D (CITRACAL+D) 315-200 MG-UNIT per tablet Take 1 tablet by mouth daily.      . Garlic (ODOR FREE GARLIC) 123XX123 MG TABS Take 2 tablets by mouth 2 (two) times daily.     . montelukast (SINGULAIR) 10 MG tablet Take 10 mg by mouth at bedtime.    . Omega-3 Fatty Acids (OMEGA 3 PO) Take by mouth daily.    . Polyethylene Glycol 3350 (MIRALAX PO) Take by mouth. For colonoscopy 08-01-17    . potassium chloride (KLOR-CON) 10 MEQ tablet Take 2 tablets (20 mEq  total) by mouth daily. 180 tablet 3  . Triamcinolone Acetonide (NASACORT AQ NA) Place into the nose daily.    Marland Kitchen triamterene-hydrochlorothiazide (MAXZIDE-25) 37.5-25 MG  tablet Take 1 tablet by mouth daily. 90 tablet 1  . vitamin D, CHOLECALCIFEROL, 400 UNITS tablet Take 400 Units by mouth 2 (two) times daily.       No current facility-administered medications for this visit.    Allergies as of 10/29/2019 - Review Complete 10/29/2019  Allergen Reaction Noted  . Cefuroxime  05/13/2015  . Cefuroxime axetil    . Cefuroxime axetil Other (See Comments) 04/02/2013  . Clarithromycin Other (See Comments) 04/02/2013  . Clarithromycin Other (See Comments) 01/31/2011  . Levofloxacin  08/15/2011  . Levofloxacin Other (See Comments) 08/15/2011  . Lipitor [atorvastatin] Other (See Comments) 02/16/2016  . Losartan Other (See Comments) 07/19/2012  . Other  05/13/2015  . Rosuvastatin Other (See Comments) 08/13/2009  . Valsartan  07/23/2017    Vitals: BP (!) 149/81 (BP Location: Left Arm, Patient Position: Sitting)   Pulse 74   Temp 97.7 F (36.5 C) Comment: taken at front  Ht 5' 1.5" (1.562 m)   Wt 142 lb (64.4 kg)   BMI 26.40 kg/m  Last Weight:  Wt Readings from Last 1 Encounters:  10/29/19 142 lb (64.4 kg)   Last Height:   Ht Readings from Last 1 Encounters:  10/29/19 5' 1.5" (1.562 m)     Physical exam: Exam: Gen: NAD, conversant, well nourised, well groomed                     CV: RRR, no MRG. No Carotid Bruits. No peripheral edema, warm, nontender Eyes: Conjunctivae clear without exudates or hemorrhage  Neuro: Detailed Neurologic Exam  Speech:    Speech is normal; fluent and spontaneous with normal comprehension.  Cognition:    The patient is oriented to person, place, and time;     recent and remote memory intact;     language fluent;     normal attention, concentration,     fund of knowledge Cranial Nerves:  The pupils are equal, round, and reactive to light. The fundi are normal and spontaneous venous pulsations are present. Visual fields are full to finger confrontation. Extraocular movements are intact. Trigeminal sensation is  intact and the muscles of mastication are normal. The face is symmetric. The palate elevates in the midline. Hearing intact. Voice is normal. Shoulder shrug is normal. The tongue has normal motion without fasciculations.   Coordination:    No ataxia or dysmetria.   Gait:   Normal native gait  Motor Observation:    No asymmetry, no atrophy, and no involuntary movements noted. Tone:    Normal muscle tone.    Posture:    Posture is normal. normal erect    Strength:    Strength is V/V in the upper and lower limbs.      Sensation: intact to LT     Reflex Exam:  DTR's:    Deep tendon reflexes in the upper and lower extremities are normal bilaterally.   Toes:    The toes are downgoing bilaterally.   Clonus:    Clonus is absent.    Assessment/Plan:  Really lovely 74 year old who looks much yonger than stated age here for dizziness. Fortunately all her symptoms are improved after hydration and treatment of sinus disease. Discussed hydration and sinus care. She does have new calf tenderness and a cord is felt, will check for DVT. RTC as needed.  Orders Placed This Encounter  Procedures  . US Venous Img Lower Unilateral Right (DVT)     Cc: Tower, Wynelle Fanny, MD,    Sarina Ill, MD  [facility] [address] [place], [place] [address]  Phone [phone] Fax [phone]

## 2019-10-29 ENCOUNTER — Other Ambulatory Visit: Payer: Self-pay

## 2019-10-29 ENCOUNTER — Ambulatory Visit (INDEPENDENT_AMBULATORY_CARE_PROVIDER_SITE_OTHER): Payer: Medicare Other | Admitting: Neurology

## 2019-10-29 ENCOUNTER — Encounter: Payer: Self-pay | Admitting: Neurology

## 2019-10-29 VITALS — BP 149/81 | HR 74 | Temp 97.7°F | Ht 61.5 in | Wt 142.0 lb

## 2019-10-29 DIAGNOSIS — R42 Dizziness and giddiness: Secondary | ICD-10-CM | POA: Diagnosis not present

## 2019-10-29 DIAGNOSIS — J349 Unspecified disorder of nose and nasal sinuses: Secondary | ICD-10-CM

## 2019-10-29 DIAGNOSIS — M79661 Pain in right lower leg: Secondary | ICD-10-CM

## 2019-10-29 NOTE — Patient Instructions (Signed)
Dizziness Dizziness is a common problem. It is a feeling of unsteadiness or light-headedness. You may feel like you are about to faint. Dizziness can lead to injury if you stumble or fall. Anyone can become dizzy, but dizziness is more common in older adults. This condition can be caused by a number of things, including medicines, dehydration, or illness. Follow these instructions at home: Eating and drinking  Drink enough fluid to keep your urine clear or pale yellow. This helps to keep you from becoming dehydrated. Try to drink more clear fluids, such as water.  Do not drink alcohol.  Limit your caffeine intake if told to do so by your health care provider. Check ingredients and nutrition facts to see if a food or beverage contains caffeine.  Limit your salt (sodium) intake if told to do so by your health care provider. Check ingredients and nutrition facts to see if a food or beverage contains sodium. Activity  Avoid making quick movements. ? Rise slowly from chairs and steady yourself until you feel okay. ? In the morning, first sit up on the side of the bed. When you feel okay, stand slowly while you hold onto something until you know that your balance is fine.  If you need to stand in one place for a long time, move your legs often. Tighten and relax the muscles in your legs while you are standing.  Do not drive or use heavy machinery if you feel dizzy.  Avoid bending down if you feel dizzy. Place items in your home so that they are easy for you to reach without leaning over. Lifestyle  Do not use any products that contain nicotine or tobacco, such as cigarettes and e-cigarettes. If you need help quitting, ask your health care provider.  Try to reduce your stress level by using methods such as yoga or meditation. Talk with your health care provider if you need help to manage your stress. General instructions  Watch your dizziness for any changes.  Take over-the-counter and  prescription medicines only as told by your health care provider. Talk with your health care provider if you think that your dizziness is caused by a medicine that you are taking.  Tell a friend or a family member that you are feeling dizzy. If he or she notices any changes in your behavior, have this person call your health care provider.  Keep all follow-up visits as told by your health care provider. This is important. Contact a health care provider if:  Your dizziness does not go away.  Your dizziness or light-headedness gets worse.  You feel nauseous.  You have reduced hearing.  You have new symptoms.  You are unsteady on your feet or you feel like the room is spinning. Get help right away if:  You vomit or have diarrhea and are unable to eat or drink anything.  You have problems talking, walking, swallowing, or using your arms, hands, or legs.  You feel generally weak.  You are not thinking clearly or you have trouble forming sentences. It may take a friend or family member to notice this.  You have chest pain, abdominal pain, shortness of breath, or sweating.  Your vision changes.  You have any bleeding.  You have a severe headache.  You have neck pain or a stiff neck.  You have a fever. These symptoms may represent a serious problem that is an emergency. Do not wait to see if the symptoms will go away. Get medical help   right away. Call your local emergency services (911 in the [place] Do not drive yourself to the hospital. Summary  Dizziness is a feeling of unsteadiness or light-headedness. This condition can be caused by a number of things, including medicines, dehydration, or illness.  Anyone can become dizzy, but dizziness is more common in older adults.  Drink enough fluid to keep your urine clear or pale yellow. Do not drink alcohol.  Avoid making quick movements if you feel dizzy. Monitor your dizziness for any changes. This information is not intended to  replace advice given to you by your health care provider. Make sure you discuss any questions you have with your health care provider. Document Revised: 06/22/2017 Document Reviewed: 07/22/2016 Elsevier Patient Education  2020 Elsevier Inc.  

## 2019-10-29 NOTE — Addendum Note (Signed)
Addended by: Gildardo Griffes on: 10/29/2019 04:40 PM   Modules accepted: Orders

## 2019-10-30 ENCOUNTER — Ambulatory Visit
Admission: RE | Admit: 2019-10-30 | Discharge: 2019-10-30 | Disposition: A | Discharge status: Home / Self Care | Payer: Medicare Other | Source: Ambulatory Visit | Attending: Family Medicine | Admitting: Family Medicine

## 2019-10-30 DIAGNOSIS — Z1231 Encounter for screening mammogram for malignant neoplasm of breast: Secondary | ICD-10-CM

## 2019-11-03 ENCOUNTER — Other Ambulatory Visit: Payer: Self-pay

## 2019-11-03 ENCOUNTER — Ambulatory Visit
Admission: RE | Admit: 2019-11-03 | Discharge: 2019-11-03 | Disposition: A | Discharge status: Home / Self Care | Payer: Medicare Other | Source: Ambulatory Visit | Attending: Neurology | Admitting: Neurology

## 2019-11-03 DIAGNOSIS — R42 Dizziness and giddiness: Secondary | ICD-10-CM | POA: Diagnosis not present

## 2019-11-03 DIAGNOSIS — J349 Unspecified disorder of nose and nasal sinuses: Secondary | ICD-10-CM

## 2019-11-03 DIAGNOSIS — M79661 Pain in right lower leg: Secondary | ICD-10-CM | POA: Diagnosis not present

## 2019-11-04 DIAGNOSIS — J3089 Other allergic rhinitis: Secondary | ICD-10-CM | POA: Diagnosis not present

## 2019-11-13 DIAGNOSIS — J3089 Other allergic rhinitis: Secondary | ICD-10-CM | POA: Diagnosis not present

## 2019-11-18 DIAGNOSIS — J3089 Other allergic rhinitis: Secondary | ICD-10-CM | POA: Diagnosis not present

## 2019-11-28 DIAGNOSIS — J3089 Other allergic rhinitis: Secondary | ICD-10-CM | POA: Diagnosis not present

## 2019-12-04 DIAGNOSIS — J3089 Other allergic rhinitis: Secondary | ICD-10-CM | POA: Diagnosis not present

## 2019-12-09 DIAGNOSIS — J3089 Other allergic rhinitis: Secondary | ICD-10-CM | POA: Diagnosis not present

## 2020-01-15 DIAGNOSIS — J3089 Other allergic rhinitis: Secondary | ICD-10-CM | POA: Diagnosis not present

## 2020-01-30 DIAGNOSIS — J3089 Other allergic rhinitis: Secondary | ICD-10-CM | POA: Diagnosis not present

## 2020-02-03 DIAGNOSIS — J3089 Other allergic rhinitis: Secondary | ICD-10-CM | POA: Diagnosis not present

## 2020-02-10 DIAGNOSIS — J3089 Other allergic rhinitis: Secondary | ICD-10-CM | POA: Diagnosis not present

## 2020-02-11 DIAGNOSIS — H401131 Primary open-angle glaucoma, bilateral, mild stage: Secondary | ICD-10-CM | POA: Diagnosis not present

## 2020-02-16 DIAGNOSIS — J0100 Acute maxillary sinusitis, unspecified: Secondary | ICD-10-CM | POA: Diagnosis not present

## 2020-02-17 ENCOUNTER — Other Ambulatory Visit: Payer: Self-pay | Admitting: Family Medicine

## 2020-02-17 DIAGNOSIS — H2513 Age-related nuclear cataract, bilateral: Secondary | ICD-10-CM | POA: Diagnosis not present

## 2020-02-17 DIAGNOSIS — H401131 Primary open-angle glaucoma, bilateral, mild stage: Secondary | ICD-10-CM | POA: Diagnosis not present

## 2020-02-19 DIAGNOSIS — J3089 Other allergic rhinitis: Secondary | ICD-10-CM | POA: Diagnosis not present

## 2020-03-04 DIAGNOSIS — J3089 Other allergic rhinitis: Secondary | ICD-10-CM | POA: Diagnosis not present

## 2020-03-12 DIAGNOSIS — J3089 Other allergic rhinitis: Secondary | ICD-10-CM | POA: Diagnosis not present

## 2020-03-18 DIAGNOSIS — J3089 Other allergic rhinitis: Secondary | ICD-10-CM | POA: Diagnosis not present

## 2020-03-23 ENCOUNTER — Other Ambulatory Visit: Payer: Self-pay

## 2020-03-23 ENCOUNTER — Ambulatory Visit (INDEPENDENT_AMBULATORY_CARE_PROVIDER_SITE_OTHER): Payer: Medicare Other | Admitting: Podiatry

## 2020-03-23 DIAGNOSIS — L989 Disorder of the skin and subcutaneous tissue, unspecified: Secondary | ICD-10-CM

## 2020-03-23 DIAGNOSIS — J3089 Other allergic rhinitis: Secondary | ICD-10-CM | POA: Diagnosis not present

## 2020-03-23 NOTE — Progress Notes (Signed)
   Subjective: 74 y.o. female presenting to the office today as a reestablish new patient for evaluation of symptomatic callus lesions to the left foot x3.  Patient does have a history of bilateral bunionectomy surgeries several years ago.  She states that the bunions have returned.  But today she only wants to have the calluses addressed to the foot.  She is currently not in a position to have any revisional surgery or address the bunions.   Past Medical History:  Diagnosis Date  . Allergic rhinitis   . Allergy   . Bronchitis    more frequent in the past  . Cataract    "suspect per pt"  . Colon polyps    history of  . GERD (gastroesophageal reflux disease)   . Glaucoma    suspected  . Heart murmur   . Hyperlipidemia   . Hypertension   . Osteoarthritis of knee   . Osteopenia      Objective:  Physical Exam General: Alert and oriented x3 in no acute distress  Dermatology: Hyperkeratotic lesion(s) present on the left foot x3. Pain on palpation with a central nucleated core noted. Skin is warm, dry and supple bilateral lower extremities. Negative for open lesions or macerations.  Vascular: Palpable pedal pulses bilaterally. No edema or erythema noted. Capillary refill within normal limits.  Neurological: Epicritic and protective threshold grossly intact bilaterally.   Musculoskeletal Exam: Pain on palpation at the keratotic lesion(s) noted. Range of motion within normal limits bilateral. Muscle strength 5/5 in all groups bilateral.  Clinical evidence of hallux limitus to the left foot with recurrence of bunion deformities to the bilateral great toes  Assessment: 1.  Porokeratosis left foot x3 2.  Recurrent bunion deformities with hallux limitus left hallux   Plan of Care:  1. Patient evaluated 2. Excisional debridement of keratoic lesion(s) using a chisel blade was performed without incident.  Recommend OTC corn and callus remover 3. Dressed area with light dressing. 4.  Patient is to return to the clinic PRN.   *Very active.  Works at [employer] full-time.  Also retired from [employer] for 40+ years  Edrick Kins, DPM [facility]  Dr. Edrick Kins, [address]                                        [place], [place] [address]                Office [phone]  Fax [phone]

## 2020-03-25 DIAGNOSIS — J3089 Other allergic rhinitis: Secondary | ICD-10-CM | POA: Diagnosis not present

## 2020-03-30 DIAGNOSIS — J3089 Other allergic rhinitis: Secondary | ICD-10-CM | POA: Diagnosis not present

## 2020-04-06 DIAGNOSIS — J3089 Other allergic rhinitis: Secondary | ICD-10-CM | POA: Diagnosis not present

## 2020-04-13 DIAGNOSIS — J3089 Other allergic rhinitis: Secondary | ICD-10-CM | POA: Diagnosis not present

## 2020-04-20 DIAGNOSIS — J3089 Other allergic rhinitis: Secondary | ICD-10-CM | POA: Diagnosis not present

## 2020-04-22 ENCOUNTER — Ambulatory Visit: Payer: Medicare Other

## 2020-04-24 ENCOUNTER — Ambulatory Visit (INDEPENDENT_AMBULATORY_CARE_PROVIDER_SITE_OTHER): Payer: Medicare Other

## 2020-04-24 ENCOUNTER — Other Ambulatory Visit: Payer: Self-pay

## 2020-04-24 DIAGNOSIS — Z23 Encounter for immunization: Secondary | ICD-10-CM | POA: Diagnosis not present

## 2020-04-27 DIAGNOSIS — J3089 Other allergic rhinitis: Secondary | ICD-10-CM | POA: Diagnosis not present

## 2020-05-04 DIAGNOSIS — J3089 Other allergic rhinitis: Secondary | ICD-10-CM | POA: Diagnosis not present

## 2020-05-11 DIAGNOSIS — J3089 Other allergic rhinitis: Secondary | ICD-10-CM | POA: Diagnosis not present

## 2020-05-18 DIAGNOSIS — J3089 Other allergic rhinitis: Secondary | ICD-10-CM | POA: Diagnosis not present

## 2020-05-29 ENCOUNTER — Other Ambulatory Visit: Payer: Self-pay | Admitting: Family Medicine

## 2020-06-03 DIAGNOSIS — J3089 Other allergic rhinitis: Secondary | ICD-10-CM | POA: Diagnosis not present

## 2020-06-08 DIAGNOSIS — J3089 Other allergic rhinitis: Secondary | ICD-10-CM | POA: Diagnosis not present

## 2020-06-08 DIAGNOSIS — J301 Allergic rhinitis due to pollen: Secondary | ICD-10-CM | POA: Diagnosis not present

## 2020-06-24 DIAGNOSIS — J3089 Other allergic rhinitis: Secondary | ICD-10-CM | POA: Diagnosis not present

## 2020-07-09 DIAGNOSIS — J3089 Other allergic rhinitis: Secondary | ICD-10-CM | POA: Diagnosis not present

## 2020-07-12 ENCOUNTER — Other Ambulatory Visit: Payer: Self-pay | Admitting: Family Medicine

## 2020-07-13 DIAGNOSIS — J3089 Other allergic rhinitis: Secondary | ICD-10-CM | POA: Diagnosis not present

## 2020-07-13 NOTE — Telephone Encounter (Signed)
Med refilled once and Carrie will reach out to pt to try and get appt scheduled  

## 2020-07-13 NOTE — Telephone Encounter (Signed)
Please schedule PE and refill until then  

## 2020-07-13 NOTE — Telephone Encounter (Signed)
Last CPE was 05/09/19 and no recent or future appts., please advise

## 2020-07-20 DIAGNOSIS — J3089 Other allergic rhinitis: Secondary | ICD-10-CM | POA: Diagnosis not present

## 2020-07-27 DIAGNOSIS — J3089 Other allergic rhinitis: Secondary | ICD-10-CM | POA: Diagnosis not present

## 2020-08-01 ENCOUNTER — Telehealth: Payer: Self-pay | Admitting: Family Medicine

## 2020-08-01 DIAGNOSIS — E7800 Pure hypercholesterolemia, unspecified: Secondary | ICD-10-CM

## 2020-08-01 DIAGNOSIS — I10 Essential (primary) hypertension: Secondary | ICD-10-CM

## 2020-08-01 NOTE — Telephone Encounter (Signed)
-----   Message from Ellamae Sia sent at 07/20/2020  2:39 PM EST ----- Regarding: Lab orders for Monday, 1.31.22 Patient is scheduled for CPX labs, please order future labs, Thanks , Karna Christmas

## 2020-08-02 ENCOUNTER — Other Ambulatory Visit: Payer: Self-pay

## 2020-08-02 ENCOUNTER — Other Ambulatory Visit (INDEPENDENT_AMBULATORY_CARE_PROVIDER_SITE_OTHER): Payer: Medicare Other

## 2020-08-02 DIAGNOSIS — E7800 Pure hypercholesterolemia, unspecified: Secondary | ICD-10-CM | POA: Diagnosis not present

## 2020-08-02 DIAGNOSIS — I10 Essential (primary) hypertension: Secondary | ICD-10-CM

## 2020-08-02 LAB — CBC WITH DIFFERENTIAL/PLATELET
Basophils Absolute: 0.1 10*3/uL (ref 0.0–0.1)
Basophils Relative: 2.7 % (ref 0.0–3.0)
Eosinophils Absolute: 0.2 10*3/uL (ref 0.0–0.7)
Eosinophils Relative: 4.2 % (ref 0.0–5.0)
HCT: 41 % (ref 36.0–46.0)
Hemoglobin: 14 g/dL (ref 12.0–15.0)
Lymphocytes Relative: 39.3 % (ref 12.0–46.0)
Lymphs Abs: 2 10*3/uL (ref 0.7–4.0)
MCHC: 34.2 g/dL (ref 30.0–36.0)
MCV: 88.7 fl (ref 78.0–100.0)
Monocytes Absolute: 0.5 10*3/uL (ref 0.1–1.0)
Monocytes Relative: 9.6 % (ref 3.0–12.0)
Neutro Abs: 2.3 10*3/uL (ref 1.4–7.7)
Neutrophils Relative %: 44.2 % (ref 43.0–77.0)
Platelets: 272 10*3/uL (ref 150.0–400.0)
RBC: 4.62 Mil/uL (ref 3.87–5.11)
RDW: 12.5 % (ref 11.5–15.5)
WBC: 5.1 10*3/uL (ref 4.0–10.5)

## 2020-08-02 LAB — LIPID PANEL
Cholesterol: 249 mg/dL — ABNORMAL HIGH (ref 0–200)
HDL: 61.9 mg/dL (ref >39.00)
LDL Cholesterol: 174 mg/dL — ABNORMAL HIGH (ref 0–99)
NonHDL: 187.33
Total CHOL/HDL Ratio: 4
Triglycerides: 67 mg/dL (ref 0.0–149.0)
VLDL: 13.4 mg/dL (ref 0.0–40.0)

## 2020-08-02 LAB — COMPREHENSIVE METABOLIC PANEL
ALT: 12 U/L (ref 0–35)
AST: 17 U/L (ref 0–37)
Albumin: 4.1 g/dL (ref 3.5–5.2)
Alkaline Phosphatase: 88 U/L (ref 39–117)
BUN: 11 mg/dL (ref 6–23)
CO2: 28 mEq/L (ref 19–32)
Calcium: 10 mg/dL (ref 8.4–10.5)
Chloride: 103 mEq/L (ref 96–112)
Creatinine, Ser: 0.86 mg/dL (ref 0.40–1.20)
GFR: 66.06 mL/min (ref >60.00)
Glucose, Bld: 91 mg/dL (ref 70–99)
Potassium: 4.1 mEq/L (ref 3.5–5.1)
Sodium: 140 mEq/L (ref 135–145)
Total Bilirubin: 0.5 mg/dL (ref 0.2–1.2)
Total Protein: 7.1 g/dL (ref 6.0–8.3)

## 2020-08-02 LAB — TSH: TSH: 1.6 u[IU]/mL (ref 0.35–4.50)

## 2020-08-03 ENCOUNTER — Ambulatory Visit (INDEPENDENT_AMBULATORY_CARE_PROVIDER_SITE_OTHER): Payer: Medicare Other

## 2020-08-03 DIAGNOSIS — Z0000 Encounter for general adult medical examination without abnormal findings: Secondary | ICD-10-CM

## 2020-08-03 NOTE — Progress Notes (Signed)
Subjective:   Amy Herman is a 75 y.o. female who presents for Medicare Annual (Subsequent) preventive examination.  Review of Systems: N/A    I connected with the patient today by telephone and verified that I am speaking with the correct person using two identifiers. Location patient: home Location nurse: work Persons participating in the telephone visit: patient, nurse.   I discussed the limitations, risks, security and privacy concerns of performing an evaluation and management service by telephone and the availability of in person appointments. I also discussed with the patient that there may be a patient responsible charge related to this service. The patient expressed understanding and verbally consented to this telephonic visit.        Cardiac Risk Factors include: advanced age (>42men, >9 women);hypertension;Other (see comment), Risk factor comments: hyperlipidemia     Objective:    Today's Vitals   There is no height or weight on file to calculate BMI.  Advanced Directives 08/03/2020 05/08/2016  Does Patient Have a Medical Advance Directive? Yes No  Type of Paramedic of [place];Living will -  Copy of [place] in Chart? No - copy requested -  Would patient like information on creating a medical advance directive? - No - patient declined information    Current Medications (verified) Outpatient Encounter Medications as of 08/03/2020  Medication Sig  . ALBUTEROL IN Inhale into the lungs as needed.  Marland Kitchen amoxicillin-clavulanate (AUGMENTIN) 875-125 MG tablet SMARTSIG:1 Tablet(s) By Mouth Every 12 Hours  . Ascorbic Acid (VITAMIN C) 1000 MG tablet Take by mouth.  Marland Kitchen aspirin 81 MG EC tablet Take 81 mg by mouth daily.  . bimatoprost (LUMIGAN) 0.01 % SOLN   . Bisacodyl (DULCOLAX PO) Take by mouth. For colonoscopy 08-01-17  . calcium citrate-vitamin D (CITRACAL+D) 315-200 MG-UNIT per tablet Take 1 tablet by mouth daily.  .  cetirizine (ZYRTEC) 10 MG tablet Take 10 mg by mouth daily.  Marland Kitchen doxycycline (VIBRAMYCIN) 100 MG capsule Take 100 mg by mouth 2 (two) times daily.  Marland Kitchen EPINEPHrine 0.3 mg/0.3 mL IJ SOAJ injection Inject into the muscle as directed.  . fluticasone (FLONASE) 50 MCG/ACT nasal spray Place 2 sprays into both nostrils 2 (two) times daily.  . Garlic 123XX123 MG TABS Take 2 tablets by mouth 2 (two) times daily.  . montelukast (SINGULAIR) 10 MG tablet Take 10 mg by mouth at bedtime.  . Omega-3 Fatty Acids (OMEGA 3 PO) Take by mouth daily.  . Polyethylene Glycol 3350 (MIRALAX PO) Take by mouth. For colonoscopy 08-01-17  . potassium chloride (KLOR-CON) 10 MEQ tablet Take 2 tablets (20 mEq  total) by mouth daily.  . Triamcinolone Acetonide (NASACORT AQ NA) Place into the nose daily.  Marland Kitchen triamterene-hydrochlorothiazide (MAXZIDE-25) 37.5-25 MG tablet TAKE 1 TABLET BY MOUTH  DAILY  . vitamin D, CHOLECALCIFEROL, 400 UNITS tablet Take 400 Units by mouth 2 (two) times daily.   No facility-administered encounter medications on file as of 08/03/2020.    Allergies (verified) Cefuroxime, Cefuroxime axetil, Cefuroxime axetil, Clarithromycin, Clarithromycin, Levofloxacin, Levofloxacin, Lipitor [atorvastatin], Losartan, Other, Rosuvastatin, and Valsartan   History: Past Medical History:  Diagnosis Date  . Allergic rhinitis   . Allergy   . Bronchitis    more frequent in the past  . Cataract    "suspect per pt"  . Colon polyps    history of  . GERD (gastroesophageal reflux disease)   . Glaucoma    suspected  . Heart murmur   . Hyperlipidemia   .  Hypertension   . Osteoarthritis of knee   . Osteopenia    Past Surgical History:  Procedure Laterality Date  . BUNIONECTOMY     bilateral-PIN LEFT GREAT TOE  . CARPAL TUNNEL RELEASE    . COLONOSCOPY    . FUNCTIONAL ENDOSCOPIC SINUS SURGERY     05-04-2011  . HYSTERECTOMY     only removed uterus  . KNEE ARTHROSCOPY     right  . TUBAL LIGATION     bilateral  . VEIN  SURGERY  2006   legs   Family History  Problem Relation Age of Onset  . Cancer Mother        colon, lungs, liver  . Colon cancer Mother 36  . Cancer Father        prostate  . Heart disease Father        A-fib  . Stroke Father        TIA's  . Alcohol abuse Brother   . Cancer Brother        pancreatic, smoker  . Diabetes Brother   . Diabetes Brother   . Stomach cancer Neg Hx   . Esophageal cancer Neg Hx   . Rectal cancer Neg Hx   . Breast cancer Neg Hx    Social History   Socioeconomic History  . Marital status: Widowed    Spouse name: Not on file  . Number of children: Not on file  . Years of education: Not on file  . Highest education level: Not on file  Occupational History  . Occupation: [employer]    Comment: over 30 years  Tobacco Use  . Smoking status: Never Smoker  . Smokeless tobacco: Never Used  . Tobacco comment: used to have secondary smoke exposure from husband  Vaping Use  . Vaping Use: Never used  Substance and Sexual Activity  . Alcohol use: No    Alcohol/week: 0.0 standard drinks  . Drug use: No  . Sexual activity: Never    Birth control/protection: Post-menopausal  Other Topics Concern  . Not on file  Social History Narrative   Retired from [employer] in 2014.    Works for [employer] alone   Works out    Right handed   Caffeine: none   Social Determinants of Radio broadcast assistant Strain: [place]   . Difficulty of Paying Living Expenses: Not hard at all  Food Insecurity: No Food Insecurity  . Worried About Charity fundraiser in the Last Year: Never true  . Ran Out of Food in the Last Year: Never true  Transportation Needs: No Transportation Needs  . Lack of Transportation (Medical): No  . Lack of Transportation (Non-Medical): No  Physical Activity: Sufficiently Active  . Days of Exercise per Week: 5 days  . Minutes of Exercise per Session: 60 min  Stress: No Stress Concern Present  . Feeling of Stress : Not at all  Social  Connections: Not on file    Tobacco Counseling Counseling given: Not Answered Comment: used to have secondary smoke exposure from husband   Clinical Intake:  Pre-visit preparation completed: Yes  Pain : No/denies pain     Nutritional Risks: None Diabetes: No  How often do you need to have someone help you when you read instructions, pamphlets, or other written materials from your doctor or pharmacy?: 1 - Never  Diabetic: No Nutrition Risk Assessment:  Has the patient had any N/V/D within the last 2 months?  No  Does the patient have any non-healing wounds?  No  Has the patient had any unintentional weight loss or weight gain?  No   Diabetes:  Is the patient diabetic?  No  If diabetic, was a CBG obtained today?  N/A Did the patient bring in their glucometer from home?  N/A How often do you monitor your CBG's? N/A.   Financial Strains and Diabetes Management:  Are you having any financial strains with the device, your supplies or your medication? N/A.  Does the patient want to be seen by Chronic Care Management for management of their diabetes?  N/A Would the patient like to be referred to a Nutritionist or for Diabetic Management?  N/A    Interpreter Needed?: No  Information entered by :: CJohnson, LPN   Activities of Daily Living In your present state of health, do you have any difficulty performing the following activities: 08/03/2020  Hearing? N  Vision? N  Difficulty concentrating or making decisions? N  Walking or climbing stairs? N  Dressing or bathing? N  Doing errands, shopping? N  Preparing Food and eating ? N  Using the Toilet? N  In the past six months, have you accidently leaked urine? N  Do you have problems with loss of bowel control? N  Managing your Medications? N  Managing your Finances? N  Housekeeping or managing your Housekeeping? N  Some recent data might be hidden    Patient Care Team: Tower, Wynelle Fanny, MD as PCP -  General Linus Mako, MD as Attending Physician (Family Medicine) Elsie Saas, MD as Consulting Physician (Orthopedic Surgery) Dingeldein, Remo Lipps, MD as Consulting Physician (Ophthalmology)  Indicate any recent Medical Services you may have received from other than [facility] providers in the past year (date may be approximate).     Assessment:   This is a routine wellness examination for [place].  Hearing/Vision screen  Hearing Screening   125Hz  250Hz  500Hz  1000Hz  2000Hz  3000Hz  4000Hz  6000Hz  8000Hz   Right ear:           Left ear:           Vision Screening Comments: Patient gets annual eye exams   Dietary issues and exercise activities discussed: Current Exercise Habits: Home exercise routine, Type of exercise: walking, Time (Minutes): 60, Frequency (Times/Week): 5, Weekly Exercise (Minutes/Week): 300, Intensity: Moderate, Exercise limited by: None identified  Goals    . Increase physical activity     Starting 05/08/2016, I will continue to exercise for at least 60 min 5 days per week.     . Patient Stated     08/03/2020, I will continue to walk 5 days a week for about 30 minutes- 1 hour.      Depression Screen PHQ 2/9 Scores 08/03/2020 05/09/2019 03/29/2018 03/29/2018 05/08/2016 06/28/2012  PHQ - 2 Score 0 0 0 0 0 0  PHQ- 9 Score 0 - - - - -    Fall Risk Fall Risk  08/03/2020 05/09/2019 03/29/2018 03/29/2018 05/08/2016  Falls in the past year? 0 0 No No No  Number falls in past yr: 0 - - - -  Injury with Fall? 0 - - - -  Risk for fall due to : No Fall Risks - - - -  Follow up Falls evaluation completed;Falls prevention discussed Falls evaluation completed - - -    FALL RISK PREVENTION PERTAINING TO THE HOME:  Any stairs in or around the home? Yes  If so, are there any without handrails? No  Home free  of loose throw rugs in walkways, pet beds, electrical cords, etc? Yes  Adequate lighting in your home to reduce risk of falls? Yes   ASSISTIVE DEVICES UTILIZED TO PREVENT  FALLS:  Life alert? No  Use of a cane, walker or w/c? No  Grab bars in the bathroom? No  Shower chair or bench in shower? No  Elevated toilet seat or a handicapped toilet? No   TIMED UP AND GO:  Was the test performed? N/A telephone visit .    Cognitive Function: MMSE - Mini Mental State Exam 08/03/2020 05/08/2016  Not completed: Refused -  Orientation to time - 5  Orientation to Place - 5  Registration - 3  Attention/ Calculation - 0  Recall - 3  Language- name 2 objects - 0  Language- repeat - 1  Language- follow 3 step command - 3  Language- read & follow direction - 0  Write a sentence - 0  Copy design - 0  Total score - 20  Mini Cog  Mini-Cog screen was not completed. Patient did not feel the need to do this. Has no memory problems. Maximum score is 22. A value of 0 denotes this part of the MMSE was not completed or the patient failed this part of the Mini-Cog screening.       Immunizations Immunization History  Administered Date(s) Administered  . Fluad Quad(high Dose 65+) 04/10/2019, 04/24/2020  . Influenza Split 06/08/2011  . Influenza Whole 05/03/2009  . Influenza, High Dose Seasonal PF 08/05/2013  . Influenza,inj,Quad PF,6+ Mos 04/10/2013, 06/03/2014, 07/29/2015, 05/08/2016, 03/29/2018  . Influenza-Unspecified 04/16/2017  . Moderna Sars-Covid-2 Vaccination 08/16/2019, 09/13/2019, 06/29/2020  . PFIZER(Purple Top)SARS-COV-2 Vaccination 12/11/2019  . Pneumococcal Conjugate-13 08/12/2015  . Pneumococcal Polysaccharide-23 07/03/2004, 01/31/2011, 08/05/2013, 11/18/2015, 11/09/2016, 11/08/2017, 12/11/2019  . Td 07/23/2006  . Tdap 07/24/2017  . Zoster 06/13/2007    TDAP status: Up to date  Flu Vaccine status: Up to date  Pneumococcal vaccine status: Up to date  Covid-19 vaccine status: Completed vaccines  Qualifies for Shingles Vaccine? Yes   Zostavax completed Yes   Shingrix Completed?: No.    Education has been provided regarding the importance of this  vaccine. Patient has been advised to call insurance company to determine out of pocket expense if they have not yet received this vaccine. Advised may also receive vaccine at local pharmacy or Health Dept. Verbalized acceptance and understanding.  Screening Tests Health Maintenance  Topic Date Due  . COLONOSCOPY (Pts 45-34yrs Insurance coverage will need to be confirmed)  08/01/2022  . TETANUS/TDAP  07/25/2027  . INFLUENZA VACCINE  Completed  . DEXA SCAN  Completed  . COVID-19 Vaccine  Completed  . Hepatitis C Screening  Completed  . PNA vac Low Risk Adult  Completed    Health Maintenance  There are no preventive care reminders to display for this patient.  Colorectal cancer screening: Type of screening: Colonoscopy. Completed 08/01/2017. Repeat every 5 years  Mammogram status: Completed 10/30/2019. Repeat every year  Bone Density status: Completed 08/08/2016. Results reflect: Bone density results: NORMAL. Repeat every 2-5 years.  Lung Cancer Screening: (Low Dose CT Chest recommended if Age 82-80 years, 30 pack-year currently smoking OR have quit w/in 15 years.) does not qualify.    Additional Screening:  Hepatitis C Screening: does qualify; Completed 05/08/2016  Vision Screening: Recommended annual ophthalmology exams for early detection of glaucoma and other disorders of the eye. Is the patient up to date with their annual eye exam?  Yes  Who  is the provider or what is the name of the office in which the patient attends annual eye exams? Dr. Holley Bouche If pt is not established with a provider, would they like to be referred to a provider to establish care? No .   Dental Screening: Recommended annual dental exams for proper oral hygiene  Community Resource Referral / Chronic Care Management: CRR required this visit?  No   CCM required this visit?  No      Plan:     I have personally reviewed and noted the following in the patient's chart:   . Medical and social  history . Use of alcohol, tobacco or illicit drugs  . Current medications and supplements . Functional ability and status . Nutritional status . Physical activity . Advanced directives . List of other physicians . Hospitalizations, surgeries, and ER visits in previous 12 months . Vitals . Screenings to include cognitive, depression, and falls . Referrals and appointments  In addition, I have reviewed and discussed with patient certain preventive protocols, quality metrics, and best practice recommendations. A written personalized care plan for preventive services as well as general preventive health recommendations were provided to patient.   Due to this being a telephonic visit, the after visit summary with patients personalized plan was offered to patient via office or my-chart. Patient preferred to pick up at office at next visit or via mychart.   Andrez Grime, LPN   01/09/8920

## 2020-08-03 NOTE — Progress Notes (Signed)
PCP notes:  Health Maintenance: No gaps noted   Abnormal Screenings: none   Patient concerns: none   Nurse concerns: none   Next PCP appt.: 08/09/2020 @ 11:30 am

## 2020-08-03 NOTE — Patient Instructions (Signed)
[name] , Thank you for taking time to come for your Medicare Wellness Visit. I appreciate your ongoing commitment to your health goals. Please review the following plan we discussed and let me know if I can assist you in the future.   Screening recommendations/referrals: Colonoscopy: Up to date, completed 08/01/2017, due 07/2022 Mammogram: Up to date, completed 10/30/2019, due 10/2020 Bone Density: Up to date, completed 08/08/2016, due 2-5 years  Recommended yearly ophthalmology/optometry visit for glaucoma screening and checkup Recommended yearly dental visit for hygiene and checkup  Vaccinations: Influenza vaccine: Up to date, completed 04/24/2020, due 01/2021 Pneumococcal vaccine: Completed series Tdap vaccine: Up to date, completed 07/24/2017, due 07/2027 Shingles vaccine: due, check with your insurance regarding coverage if interested  COVID: Completed series  Advanced directives: Please bring a copy of your POA (Power of Attorney) and/or Living Will to your next appointment.   Conditions/risks identified: hypertension, hyperlipidemia   Next appointment: 08/09/2020 @ 11:30 am    Preventive Care 65 Years and Older, Female Preventive care refers to lifestyle choices and visits with your health care provider that can promote health and wellness. What does preventive care include?  A yearly physical exam. This is also called an annual well check.  Dental exams once or twice a year.  Routine eye exams. Ask your health care provider how often you should have your eyes checked.  Personal lifestyle choices, including:  Daily care of your teeth and gums.  Regular physical activity.  Eating a healthy diet.  Avoiding tobacco and drug use.  Limiting alcohol use.  Practicing safe sex.  Taking low-dose aspirin every day.  Taking vitamin and mineral supplements as recommended by your health care provider. What happens during an annual well check? The services and screenings done  by your health care provider during your annual well check will depend on your age, overall health, lifestyle risk factors, and family history of disease. Counseling  Your health care provider may ask you questions about your:  Alcohol use.  Tobacco use.  Drug use.  Emotional well-being.  Home and relationship well-being.  Sexual activity.  Eating habits.  History of falls.  Memory and ability to understand (cognition).  Work and work Statistician.  Reproductive health. Screening  You may have the following tests or measurements:  Height, weight, and BMI.  Blood pressure.  Lipid and cholesterol levels. These may be checked every 5 years, or more frequently if you are over 72 years old.  Skin check.  Lung cancer screening. You may have this screening every year starting at age 78 if you have a 30-pack-year history of smoking and currently smoke or have quit within the past 15 years.  Fecal occult blood test (FOBT) of the stool. You may have this test every year starting at age 19.  Flexible sigmoidoscopy or colonoscopy. You may have a sigmoidoscopy every 5 years or a colonoscopy every 10 years starting at age 28.  Hepatitis C blood test.  Hepatitis B blood test.  Sexually transmitted disease (STD) testing.  Diabetes screening. This is done by checking your blood sugar (glucose) after you have not eaten for a while (fasting). You may have this done every 1-3 years.  Bone density scan. This is done to screen for osteoporosis. You may have this done starting at age 4.  Mammogram. This may be done every 1-2 years. Talk to your health care provider about how often you should have regular mammograms. Talk with your health care provider about your test  results, treatment options, and if necessary, the need for more tests. Vaccines  Your health care provider may recommend certain vaccines, such as:  Influenza vaccine. This is recommended every year.  Tetanus,  diphtheria, and acellular pertussis (Tdap, Td) vaccine. You may need a Td booster every 10 years.  Zoster vaccine. You may need this after age 17.  Pneumococcal 13-valent conjugate (PCV13) vaccine. One dose is recommended after age 53.  Pneumococcal polysaccharide (PPSV23) vaccine. One dose is recommended after age 56. Talk to your health care provider about which screenings and vaccines you need and how often you need them. This information is not intended to replace advice given to you by your health care provider. Make sure you discuss any questions you have with your health care provider. Document Released: 07/16/2015 Document Revised: 03/08/2016 Document Reviewed: 04/20/2015 Elsevier Interactive Patient Education  2017 [employer] Prevention in the Home Falls can cause injuries. They can happen to people of all ages. There are many things you can do to make your home safe and to help prevent falls. What can I do on the outside of my home?  Regularly fix the edges of walkways and driveways and fix any cracks.  Remove anything that might make you trip as you walk through a door, such as a raised step or threshold.  Trim any bushes or trees on the path to your home.  Use bright outdoor lighting.  Clear any walking paths of anything that might make someone trip, such as rocks or tools.  Regularly check to see if handrails are loose or broken. Make sure that both sides of any steps have handrails.  Any raised decks and porches should have guardrails on the edges.  Have any leaves, snow, or ice cleared regularly.  Use sand or salt on walking paths during winter.  Clean up any spills in your garage right away. This includes oil or grease spills. What can I do in the bathroom?  Use night lights.  Install grab bars by the toilet and in the tub and shower. Do not use towel bars as grab bars.  Use non-skid mats or decals in the tub or shower.  If you need to sit down in  the shower, use a plastic, non-slip stool.  Keep the floor dry. Clean up any water that spills on the floor as soon as it happens.  Remove soap buildup in the tub or shower regularly.  Attach bath mats securely with double-sided non-slip rug tape.  Do not have throw rugs and other things on the floor that can make you trip. What can I do in the bedroom?  Use night lights.  Make sure that you have a light by your bed that is easy to reach.  Do not use any sheets or blankets that are too big for your bed. They should not hang down onto the floor.  Have a firm chair that has side arms. You can use this for support while you get dressed.  Do not have throw rugs and other things on the floor that can make you trip. What can I do in the kitchen?  Clean up any spills right away.  Avoid walking on wet floors.  Keep items that you use a lot in easy-to-reach places.  If you need to reach something above you, use a strong step stool that has a grab bar.  Keep electrical cords out of the way.  Do not use floor polish or wax that makes  floors slippery. If you must use wax, use non-skid floor wax.  Do not have throw rugs and other things on the floor that can make you trip. What can I do with my stairs?  Do not leave any items on the stairs.  Make sure that there are handrails on both sides of the stairs and use them. Fix handrails that are broken or loose. Make sure that handrails are as long as the stairways.  Check any carpeting to make sure that it is firmly attached to the stairs. Fix any carpet that is loose or worn.  Avoid having throw rugs at the top or bottom of the stairs. If you do have throw rugs, attach them to the floor with carpet tape.  Make sure that you have a light switch at the top of the stairs and the bottom of the stairs. If you do not have them, ask someone to add them for you. What else can I do to help prevent falls?  Wear shoes that:  Do not have high  heels.  Have rubber bottoms.  Are comfortable and fit you well.  Are closed at the toe. Do not wear sandals.  If you use a stepladder:  Make sure that it is fully opened. Do not climb a closed stepladder.  Make sure that both sides of the stepladder are locked into place.  Ask someone to hold it for you, if possible.  Clearly mark and make sure that you can see:  Any grab bars or handrails.  First and last steps.  Where the edge of each step is.  Use tools that help you move around (mobility aids) if they are needed. These include:  Canes.  Walkers.  Scooters.  Crutches.  Turn on the lights when you go into a dark area. Replace any light bulbs as soon as they burn out.  Set up your furniture so you have a clear path. Avoid moving your furniture around.  If any of your floors are uneven, fix them.  If there are any pets around you, be aware of where they are.  Review your medicines with your doctor. Some medicines can make you feel dizzy. This can increase your chance of falling. Ask your doctor what other things that you can do to help prevent falls. This information is not intended to replace advice given to you by your health care provider. Make sure you discuss any questions you have with your health care provider. Document Released: 04/15/2009 Document Revised: 11/25/2015 Document Reviewed: 07/24/2014 Elsevier Interactive Patient Education  2017 [employer].

## 2020-08-06 DIAGNOSIS — J3089 Other allergic rhinitis: Secondary | ICD-10-CM | POA: Diagnosis not present

## 2020-08-09 ENCOUNTER — Ambulatory Visit: Payer: Medicare Other | Admitting: Family Medicine

## 2020-08-10 ENCOUNTER — Encounter: Payer: Self-pay | Admitting: Family Medicine

## 2020-08-10 ENCOUNTER — Encounter: Payer: Medicare Other | Admitting: Family Medicine

## 2020-08-10 ENCOUNTER — Other Ambulatory Visit: Payer: Self-pay

## 2020-08-10 ENCOUNTER — Ambulatory Visit (INDEPENDENT_AMBULATORY_CARE_PROVIDER_SITE_OTHER): Payer: Medicare Other | Admitting: Family Medicine

## 2020-08-10 VITALS — BP 132/76 | HR 79 | Temp 97.8°F | Ht 60.5 in | Wt 139.1 lb

## 2020-08-10 DIAGNOSIS — T466X5A Adverse effect of antihyperlipidemic and antiarteriosclerotic drugs, initial encounter: Secondary | ICD-10-CM

## 2020-08-10 DIAGNOSIS — I10 Essential (primary) hypertension: Secondary | ICD-10-CM

## 2020-08-10 DIAGNOSIS — E7800 Pure hypercholesterolemia, unspecified: Secondary | ICD-10-CM

## 2020-08-10 DIAGNOSIS — M85859 Other specified disorders of bone density and structure, unspecified thigh: Secondary | ICD-10-CM

## 2020-08-10 DIAGNOSIS — G720 Drug-induced myopathy: Secondary | ICD-10-CM | POA: Diagnosis not present

## 2020-08-10 DIAGNOSIS — J3089 Other allergic rhinitis: Secondary | ICD-10-CM | POA: Diagnosis not present

## 2020-08-10 MED ORDER — TRIAMTERENE-HCTZ 37.5-25 MG PO TABS
1.0000 | ORAL_TABLET | Freq: Every day | ORAL | 3 refills | Status: DC
Start: 2020-08-10 — End: 2021-06-01

## 2020-08-10 MED ORDER — POTASSIUM CHLORIDE ER 10 MEQ PO TBCR: EXTENDED_RELEASE_TABLET | ORAL | 3 refills | Status: DC

## 2020-08-10 NOTE — Patient Instructions (Signed)
If you are interested in the new shingles vaccine (Shingrix) - call your local pharmacy to check on coverage and availability  If affordable, get on a wait list at your pharmacy to get the vaccine.  Keep taking good care of yourself   Stay active Labs are stable   The office will call you regarding a cholesterol /cardiology referral

## 2020-08-10 NOTE — Progress Notes (Signed)
Subjective:    Patient ID: Amy Herman, female    DOB: 1945/10/07, 75 y.o.   MRN: [id number]  This visit occurred during the SARS-CoV-2 public health emergency.  Safety protocols were in place, including screening questions prior to the visit, additional usage of staff PPE, and extensive cleaning of exam room while observing appropriate contact time as indicated for disinfecting solutions.    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-looking forward to that   [employer] regularly   Had amw on 08/03/20-no gaps noted  Colonoscopy 1/19 with 5 y recall   dexa 2/18-nl bmd   (osteopenia in the past) Falls -none  Fractures -none  Takes ca and D Does exercise   Some arthritis issues  Talking about a knee replacement   Mammogram 4/21 normal Self breast exam - no lumps   HTN  bp is stable today  No cp or palpitations or headaches or edema  No side effects to medicines  BP Readings from Last 3 Encounters:  08/10/20 132/76  10/29/19 (!) 149/81  09/10/19 124/78     Pulse Readings from Last 3 Encounters:  08/10/20 79  10/29/19 74  09/10/19 89   Takes triam-hctz 37.5-25 mg once daily  Also K    Hyperlipidemia Lab Results  Component Value Date   CHOL 249 (H) 08/02/2020   CHOL 261 (H) 05/02/2019   CHOL 219 (H) 03/20/2018   Lab Results  Component Value Date   HDL 61.90 08/02/2020   HDL 53.40 05/02/2019   HDL 53.40 03/20/2018   Lab Results  Component Value Date   LDLCALC 174 (H) 08/02/2020   LDLCALC 194 (H) 05/02/2019   LDLCALC 148 (H) 03/20/2018   Lab Results  Component Value Date   TRIG 67.0 08/02/2020   TRIG 68.0 05/02/2019   TRIG 88.0 03/20/2018   Lab Results  Component  Value Date   CHOLHDL 4 08/02/2020   CHOLHDL 5 05/02/2019   CHOLHDL 4 03/20/2018   No results found for: LDLDIRECT Intolerant to all statins even low/intermittent In past declined cardiology/lipid consult -she is interested now   Other labs Lab Results  Component Value Date   CREATININE 0.86 08/02/2020   BUN 11 08/02/2020   NA 140 08/02/2020   K 4.1 08/02/2020   CL 103 08/02/2020   CO2 28 08/02/2020   Lab Results  Component Value Date   ALT 12 08/02/2020   AST 17 08/02/2020   ALKPHOS 88 08/02/2020   BILITOT 0.5 08/02/2020   Glucose 91 Lab Results  Component Value Date   WBC 5.1 08/02/2020   HGB 14.0 08/02/2020   HCT 41.0 08/02/2020   MCV 88.7 08/02/2020   PLT 272.0 08/02/2020   Lab Results  Component Value Date   TSH 1.60 08/02/2020   Patient Active Problem List   Diagnosis Date Noted  . Dizziness 07/09/2019  . Sinus disease 07/09/2019  . Medicare annual wellness visit, subsequent 05/09/2019  . Medicare annual wellness visit, initial 03/29/2018  . Left knee pain 12/13/2017  . Primary osteoarthritis of left knee 12/13/2017  . History of UTI 11/11/2017  . Encounter for screening mammogram for  breast cancer 05/08/2016  . Estrogen deficiency 02/16/2016  . Contact with or exposure to communicable disease 02/16/2016  . Pre-operative cardiovascular examination 07/21/2013  . GERD (gastroesophageal reflux disease) 06/18/2013  . Varicose veins of lower extremities with other complications 63/87/5643  . Lumbar disc disease 08/15/2011  . Joint pain 08/15/2011  . Osteopenia 01/31/2011  . Post-menopausal 01/31/2011  . Hypokalemia 01/31/2011  . Hyperlipidemia 03/24/2008  . ESSENTIAL HYPERTENSION, BENIGN 03/24/2008  . COLONIC POLYPS, HX OF 03/24/2008   Past Medical History:  Diagnosis Date  . Allergic rhinitis   . Allergy   . Bronchitis    more frequent in the past  . Cataract    "suspect per pt"  . Colon polyps    history of  . GERD (gastroesophageal reflux  disease)   . Glaucoma    suspected  . Heart murmur   . Hyperlipidemia   . Hypertension   . Osteoarthritis of knee   . Osteopenia    Past Surgical History:  Procedure Laterality Date  . BUNIONECTOMY     bilateral-PIN LEFT GREAT TOE  . CARPAL TUNNEL RELEASE    . COLONOSCOPY    . FUNCTIONAL ENDOSCOPIC SINUS SURGERY     05-04-2011  . HYSTERECTOMY     only removed uterus  . KNEE ARTHROSCOPY     right  . TUBAL LIGATION     bilateral  . VEIN SURGERY  2006   legs   Social History   Tobacco Use  . Smoking status: Never Smoker  . Smokeless tobacco: Never Used  . Tobacco comment: used to have secondary smoke exposure from husband  Vaping Use  . Vaping Use: Never used  Substance Use Topics  . Alcohol use: No    Alcohol/week: 0.0 standard drinks  . Drug use: No   Family History  Problem Relation Age of Onset  . Cancer Mother        colon, lungs, liver  . Colon cancer Mother 16  . Cancer Father        prostate  . Heart disease Father        A-fib  . Stroke Father        TIA's  . Alcohol abuse Brother   . Cancer Brother        pancreatic, smoker  . Diabetes Brother   . Diabetes Brother   . Stomach cancer Neg Hx   . Esophageal cancer Neg Hx   . Rectal cancer Neg Hx   . Breast cancer Neg Hx    Allergies  Allergen Reactions  . Cefuroxime     Other reaction(s): RASH  . Cefuroxime Axetil     REACTION: bumps on tounge  . Cefuroxime Axetil Other (See Comments)    Other reaction(s): RASH Other reaction(s): RASH REACTION: bumps on tounge  . Clarithromycin Other (See Comments)    Other reaction(s): OTHER Other reaction(s): OTHER REACTION: GI Biaxin   . Clarithromycin Other (See Comments)    Burning sensation to stomach  . Levofloxacin   . Levofloxacin Other (See Comments)    Joint pains  . Lipitor [Atorvastatin] Other (See Comments)    Muscle pain  Muscle pain   . Losartan Other (See Comments)    cough cough  . Other     Other reaction(s): Unknown  .  Rosuvastatin Other (See Comments)    REACTION: severe muscle pain REACTION: severe muscle pain  . Valsartan     Taken off from MD d/t a recall   Current Outpatient  Medications on File Prior to Visit  Medication Sig Dispense Refill  . ALBUTEROL IN Inhale into the lungs as needed.    . Ascorbic Acid (VITAMIN C) 1000 MG tablet Take by mouth.    Marland Kitchen aspirin 81 MG EC tablet Take 81 mg by mouth daily.    . bimatoprost (LUMIGAN) 0.01 % SOLN     . calcium citrate-vitamin D (CITRACAL+D) 315-200 MG-UNIT per tablet Take 1 tablet by mouth daily.    . cetirizine (ZYRTEC) 10 MG tablet Take 10 mg by mouth daily.    Marland Kitchen EPINEPHrine 0.3 mg/0.3 mL IJ SOAJ injection Inject into the muscle as directed.    . fluticasone (FLONASE) 50 MCG/ACT nasal spray Place 2 sprays into both nostrils 2 (two) times daily.    . Garlic 387 MG TABS Take 2 tablets by mouth 2 (two) times daily.    . montelukast (SINGULAIR) 10 MG tablet Take 10 mg by mouth at bedtime.    . Omega-3 Fatty Acids (OMEGA 3 PO) Take by mouth daily.    . Polyethylene Glycol 3350 (MIRALAX PO) Take by mouth. For colonoscopy 08-01-17    . Triamcinolone Acetonide (NASACORT AQ NA) Place into the nose daily.    . vitamin D, CHOLECALCIFEROL, 400 UNITS tablet Take 400 Units by mouth 2 (two) times daily.     No current facility-administered medications on file prior to visit.     Review of Systems  Constitutional: Negative for activity change, appetite change, fatigue, fever and unexpected weight change.  HENT: Negative for congestion, ear pain, rhinorrhea, sinus pressure and sore throat.   Eyes: Negative for pain, redness and visual disturbance.  Respiratory: Negative for cough, shortness of breath and wheezing.   Cardiovascular: Negative for chest pain and palpitations.  Gastrointestinal: Negative for abdominal pain, blood in stool, constipation and diarrhea.  Endocrine: Negative for polydipsia and polyuria.  Genitourinary: Negative for dysuria, frequency and  urgency.  Musculoskeletal: Positive for arthralgias. Negative for back pain and myalgias.       Knee pain  Bunions-foot pain   Skin: Negative for pallor and rash.  Allergic/Immunologic: Negative for environmental allergies.  Neurological: Negative for dizziness, syncope and headaches.  Hematological: Negative for adenopathy. Does not bruise/bleed easily.  Psychiatric/Behavioral: Negative for decreased concentration and dysphoric mood. The patient is not nervous/anxious.        Objective:   Physical Exam Constitutional:      General: She is not in acute distress.    Appearance: Normal appearance. She is well-developed and normal weight. She is not ill-appearing or diaphoretic.  HENT:     Head: Normocephalic and atraumatic.     Right Ear: Tympanic membrane, ear canal and external ear normal.     Left Ear: Tympanic membrane, ear canal and external ear normal.     Nose: Nose normal. No congestion.     Mouth/Throat:     Mouth: Mucous membranes are moist.     Pharynx: Oropharynx is clear. No posterior oropharyngeal erythema.  Eyes:     General: No scleral icterus.    Extraocular Movements: Extraocular movements intact.     Conjunctiva/sclera: Conjunctivae normal.     Pupils: Pupils are equal, round, and reactive to light.  Neck:     Thyroid: No thyromegaly.     Vascular: No carotid bruit or JVD.  Cardiovascular:     Rate and Rhythm: Normal rate and regular rhythm.     Pulses: Normal pulses.     Heart sounds: Normal heart sounds. No  gallop.   Pulmonary:     Effort: Pulmonary effort is normal. No respiratory distress.     Breath sounds: Normal breath sounds. No wheezing.     Comments: Good air exch Chest:     Chest wall: No tenderness.  Abdominal:     General: Bowel sounds are normal. There is no distension or abdominal bruit.     Palpations: Abdomen is soft. There is no mass.     Tenderness: There is no abdominal tenderness.     Hernia: No hernia is present.  Genitourinary:     Comments: Breast exam: No mass, nodules, thickening, tenderness, bulging, retraction, inflamation, nipple discharge or skin changes noted.  No axillary or clavicular LA.     Musculoskeletal:        General: No tenderness. Normal range of motion.     Cervical back: Normal range of motion and neck supple. No rigidity. No muscular tenderness.     Right lower leg: No edema.     Left lower leg: No edema.     Comments: No kyphosis   Lymphadenopathy:     Cervical: No cervical adenopathy.  Skin:    General: Skin is warm and dry.     Coloration: Skin is not pale.     Findings: No erythema or rash.  Neurological:     Mental Status: She is alert. Mental status is at baseline.     Cranial Nerves: No cranial nerve deficit.     Motor: No abnormal muscle tone.     Coordination: Coordination normal.     Gait: Gait normal.     Deep Tendon Reflexes: Reflexes are normal and symmetric. Reflexes normal.  Psychiatric:        Mood and Affect: Mood normal.        Cognition and Memory: Cognition and memory normal.           Assessment & Plan:   Problem List Items Addressed This Visit      Cardiovascular and Mediastinum   ESSENTIAL HYPERTENSION, BENIGN - Primary    bp in fair control at this time  BP Readings from Last 1 Encounters:  08/10/20 132/76   No changes needed Most recent labs reviewed  Disc lifstyle change with low sodium diet and exercise  Plan to continue triam-hct 37.5-25 mg daily along with K       Relevant Medications   triamterene-hydrochlorothiazide (MAXZIDE-25) 37.5-25 MG tablet     Musculoskeletal and Integument   Osteopenia    Last dexa in the normal range  No falls or fx Good exercise  Taking ca and D  Disc fall prev      Statin myopathy    Intolerant of even low doses        Other   Hyperlipidemia    Disc goals for lipids and reasons to control them Rev last labs with pt Rev low sat fat diet in detail Some imp but not at goal with LDL in 170s Pt is  interested in lipid clinic now  Intol of statins even low dose int  Ref done Pt will continue good diet       Relevant Medications   triamterene-hydrochlorothiazide (MAXZIDE-25) 37.5-25 MG tablet   Other Relevant Orders   Ambulatory referral to Cardiology

## 2020-08-10 NOTE — Assessment & Plan Note (Signed)
bp in fair control at this time  BP Readings from Last 1 Encounters:  08/10/20 132/76   No changes needed Most recent labs reviewed  Disc lifstyle change with low sodium diet and exercise  Plan to continue triam-hct 37.5-25 mg daily along with K

## 2020-08-10 NOTE — Assessment & Plan Note (Signed)
Last dexa in the normal range  No falls or fx Good exercise  Taking ca and D  Disc fall prev

## 2020-08-10 NOTE — Assessment & Plan Note (Signed)
Intolerant of even low doses

## 2020-08-10 NOTE — Assessment & Plan Note (Signed)
Disc goals for lipids and reasons to control them Rev last labs with pt Rev low sat fat diet in detail Some imp but not at goal with LDL in 170s Pt is interested in lipid clinic now  Intol of statins even low dose int  Ref done Pt will continue good diet

## 2020-08-12 DIAGNOSIS — M170 Bilateral primary osteoarthritis of knee: Secondary | ICD-10-CM | POA: Diagnosis not present

## 2020-08-12 DIAGNOSIS — Z23 Encounter for immunization: Secondary | ICD-10-CM | POA: Diagnosis not present

## 2020-08-15 DIAGNOSIS — J301 Allergic rhinitis due to pollen: Secondary | ICD-10-CM | POA: Insufficient documentation

## 2020-08-16 DIAGNOSIS — H401131 Primary open-angle glaucoma, bilateral, mild stage: Secondary | ICD-10-CM | POA: Diagnosis not present

## 2020-08-17 DIAGNOSIS — J3089 Other allergic rhinitis: Secondary | ICD-10-CM | POA: Diagnosis not present

## 2020-08-27 DIAGNOSIS — J3089 Other allergic rhinitis: Secondary | ICD-10-CM | POA: Diagnosis not present

## 2020-08-31 DIAGNOSIS — J3089 Other allergic rhinitis: Secondary | ICD-10-CM | POA: Diagnosis not present

## 2020-09-09 DIAGNOSIS — J3089 Other allergic rhinitis: Secondary | ICD-10-CM | POA: Diagnosis not present

## 2020-09-12 NOTE — Progress Notes (Deleted)
Cardiology Office Note  Date:  09/12/2020   ID:  [name], [name] 07/07/1945, MRN [id number]  PCP:  Abner Greenspan, MD   No chief complaint on file.   HPI:  Amy Herman is a 75 year old woman with past medical history of Dizziness Neck pain Headaches Hypertension Hyperlipidemia, intolerant of statins Referred by Dr. Glori Bickers for consultation of her hyperlipidemia      Lab Results  Component Value Date   CHOL 249 (H) 08/02/2020   HDL 61.90 08/02/2020   LDLCALC 174 (H) 08/02/2020   TRIG 67.0 08/02/2020    PMH:   has a past medical history of Allergic rhinitis, Allergy, Bronchitis, Cataract, Colon polyps, GERD (gastroesophageal reflux disease), Glaucoma, Heart murmur, Hyperlipidemia, Hypertension, Osteoarthritis of knee, and Osteopenia.  PSH:    Past Surgical History:  Procedure Laterality Date  . BUNIONECTOMY     bilateral-PIN LEFT GREAT TOE  . CARPAL TUNNEL RELEASE    . COLONOSCOPY    . FUNCTIONAL ENDOSCOPIC SINUS SURGERY     05-04-2011  . HYSTERECTOMY     only removed uterus  . KNEE ARTHROSCOPY     right  . TUBAL LIGATION     bilateral  . VEIN SURGERY  2006   legs    Current Outpatient Medications  Medication Sig Dispense Refill  . ALBUTEROL IN Inhale into the lungs as needed.    . Ascorbic Acid (VITAMIN C) 1000 MG tablet Take by mouth.    Marland Kitchen aspirin 81 MG EC tablet Take 81 mg by mouth daily.    . bimatoprost (LUMIGAN) 0.01 % SOLN     . calcium citrate-vitamin D (CITRACAL+D) 315-200 MG-UNIT per tablet Take 1 tablet by mouth daily.    . cetirizine (ZYRTEC) 10 MG tablet Take 10 mg by mouth daily.    Marland Kitchen EPINEPHrine 0.3 mg/0.3 mL IJ SOAJ injection Inject into the muscle as directed.    . fluticasone (FLONASE) 50 MCG/ACT nasal spray Place 2 sprays into both nostrils 2 (two) times daily.    . Garlic 818 MG TABS Take 2 tablets by mouth 2 (two) times daily.    . montelukast (SINGULAIR) 10 MG tablet Take 10 mg by mouth at bedtime.    . Omega-3 Fatty Acids (OMEGA  3 PO) Take by mouth daily.    . Polyethylene Glycol 3350 (MIRALAX PO) Take by mouth. For colonoscopy 08-01-17    . potassium chloride (KLOR-CON) 10 MEQ tablet Take 2 tablets (20 mEq  total) by mouth daily. 180 tablet 3  . Triamcinolone Acetonide (NASACORT AQ NA) Place into the nose daily.    Marland Kitchen triamterene-hydrochlorothiazide (MAXZIDE-25) 37.5-25 MG tablet Take 1 tablet by mouth daily. 90 tablet 3  . vitamin D, CHOLECALCIFEROL, 400 UNITS tablet Take 400 Units by mouth 2 (two) times daily.     No current facility-administered medications for this visit.     Allergies:   Cefuroxime, Cefuroxime axetil, Cefuroxime axetil, Clarithromycin, Clarithromycin, Levofloxacin, Levofloxacin, Lipitor [atorvastatin], Losartan, Other, Rosuvastatin, and Valsartan   Social History:  The patient  reports that she has never smoked. She has never used smokeless tobacco. She reports that she does not drink alcohol and does not use drugs.   Family History:   family history includes Alcohol abuse in her brother; Cancer in her brother, father, and mother; Colon cancer (age of onset: 50) in her mother; Diabetes in her brother and brother; Heart disease in her father; Stroke in her father.    Review of Systems: ROS   PHYSICAL EXAM:  VS:  There were no vitals taken for this visit. , BMI There is no height or weight on file to calculate BMI. GEN: Well nourished, well developed, in no acute distress HEENT: normal Neck: no JVD, carotid bruits, or masses Cardiac: RRR; no murmurs, rubs, or gallops,no edema  Respiratory:  clear to auscultation bilaterally, normal work of breathing GI: soft, nontender, nondistended, + BS MS: no deformity or atrophy Skin: warm and dry, no rash Neuro:  Strength and sensation are intact Psych: euthymic mood, full affect    Recent Labs: 08/02/2020: ALT 12; BUN 11; Creatinine, Ser 0.86; Hemoglobin 14.0; Platelets 272.0; Potassium 4.1; Sodium 140; TSH 1.60    Lipid Panel Lab Results   Component Value Date   CHOL 249 (H) 08/02/2020   HDL 61.90 08/02/2020   LDLCALC 174 (H) 08/02/2020   TRIG 67.0 08/02/2020      Wt Readings from Last 3 Encounters:  08/10/20 139 lb 1 oz (63.1 kg)  10/29/19 142 lb (64.4 kg)  09/10/19 140 lb 5 oz (63.6 kg)       ASSESSMENT AND PLAN:  Problem List Items Addressed This Visit   None      Disposition:   F/U  12 months   Total encounter time more than 25 minutes  Greater than 50% was spent in counseling and coordination of care with the patient    Signed, Esmond Plants, M.D., Ph.D. [place], [place]

## 2020-09-13 ENCOUNTER — Ambulatory Visit: Payer: Medicare Other | Admitting: Cardiovascular Disease

## 2020-09-13 DIAGNOSIS — E782 Mixed hyperlipidemia: Secondary | ICD-10-CM

## 2020-09-13 DIAGNOSIS — I10 Essential (primary) hypertension: Secondary | ICD-10-CM

## 2020-09-13 DIAGNOSIS — R42 Dizziness and giddiness: Secondary | ICD-10-CM

## 2020-09-14 DIAGNOSIS — J3089 Other allergic rhinitis: Secondary | ICD-10-CM | POA: Diagnosis not present

## 2020-09-16 DIAGNOSIS — J309 Allergic rhinitis, unspecified: Secondary | ICD-10-CM | POA: Insufficient documentation

## 2020-09-16 DIAGNOSIS — R059 Cough, unspecified: Secondary | ICD-10-CM | POA: Insufficient documentation

## 2020-09-17 ENCOUNTER — Ambulatory Visit: Payer: Medicare Other | Admitting: Podiatry

## 2020-09-17 ENCOUNTER — Ambulatory Visit: Payer: Medicare Other | Admitting: Cardiovascular Disease

## 2020-09-21 DIAGNOSIS — J3089 Other allergic rhinitis: Secondary | ICD-10-CM | POA: Diagnosis not present

## 2020-09-28 DIAGNOSIS — J3089 Other allergic rhinitis: Secondary | ICD-10-CM | POA: Diagnosis not present

## 2020-10-05 DIAGNOSIS — J3089 Other allergic rhinitis: Secondary | ICD-10-CM | POA: Diagnosis not present

## 2020-10-12 DIAGNOSIS — J3089 Other allergic rhinitis: Secondary | ICD-10-CM | POA: Diagnosis not present

## 2020-10-18 ENCOUNTER — Ambulatory Visit (INDEPENDENT_AMBULATORY_CARE_PROVIDER_SITE_OTHER): Payer: Medicare Other | Admitting: Cardiovascular Disease

## 2020-10-18 ENCOUNTER — Other Ambulatory Visit: Payer: Self-pay | Admitting: Family Medicine

## 2020-10-18 ENCOUNTER — Encounter: Payer: Self-pay | Admitting: Cardiovascular Disease

## 2020-10-18 ENCOUNTER — Other Ambulatory Visit: Payer: Self-pay

## 2020-10-18 VITALS — BP 150/80 | HR 69 | Ht 62.0 in | Wt 147.0 lb

## 2020-10-18 DIAGNOSIS — E782 Mixed hyperlipidemia: Secondary | ICD-10-CM

## 2020-10-18 DIAGNOSIS — M791 Myalgia, unspecified site: Secondary | ICD-10-CM

## 2020-10-18 DIAGNOSIS — I208 Other forms of angina pectoris: Secondary | ICD-10-CM

## 2020-10-18 DIAGNOSIS — I10 Essential (primary) hypertension: Secondary | ICD-10-CM | POA: Diagnosis not present

## 2020-10-18 DIAGNOSIS — Z1231 Encounter for screening mammogram for malignant neoplasm of breast: Secondary | ICD-10-CM

## 2020-10-18 DIAGNOSIS — Z136 Encounter for screening for cardiovascular disorders: Secondary | ICD-10-CM | POA: Diagnosis not present

## 2020-10-18 DIAGNOSIS — Z8249 Family history of ischemic heart disease and other diseases of the circulatory system: Secondary | ICD-10-CM

## 2020-10-18 DIAGNOSIS — T466X5A Adverse effect of antihyperlipidemic and antiarteriosclerotic drugs, initial encounter: Secondary | ICD-10-CM | POA: Diagnosis not present

## 2020-10-18 NOTE — Progress Notes (Signed)
Cardiology Office Note  Date:  10/18/2020   ID:  Amy Herman, DOB 05-04-1946, MRN [id number]  PCP:  Abner Greenspan, MD   Chief Complaint  Patient presents with  . NEW patient-hyperlipidemia    HPI:  Amy Herman is a 75 year old woman with past medical history of Hypertension Osteoarthritis Hyperlipidemia Who presents by referral from primary care Dr. Glori Bickers for evaluation of her hyperlipidemia  Discussion concerning her lipids When exercising regularly at the gym total cholesterol low 200s Without exercise, Total cholesterol 250 LDL 174  Tries to stay healthy, eats carefully, likes to stay active, works out  No prior imaging available such as CT scans that would identify premature coronary or aortic atherosclerosis  Tried crestor/lipitor/welchol from 2012 to 2015 Side effects: myalgias  Family hx: Mother: Cancer Father: CVA 67 brothers and sisters: no major cardiac hx  EKG personally reviewed by myself on todays visit Shows normal sinus rhythm with rate 69 bpm, PVC   PMH:   has a past medical history of Allergic rhinitis, Allergy, Bronchitis, Cataract, Colon polyps, GERD (gastroesophageal reflux disease), Glaucoma, Heart murmur, Hyperlipidemia, Hypertension, Osteoarthritis of knee, and Osteopenia.  PSH:    Past Surgical History:  Procedure Laterality Date  . BUNIONECTOMY     bilateral-PIN LEFT GREAT TOE  . CARPAL TUNNEL RELEASE    . COLONOSCOPY    . FUNCTIONAL ENDOSCOPIC SINUS SURGERY     05-04-2011  . HYSTERECTOMY     only removed uterus  . KNEE ARTHROSCOPY     right  . TUBAL LIGATION     bilateral  . VEIN SURGERY  2006   legs    Current Outpatient Medications  Medication Sig Dispense Refill  . albuterol (VENTOLIN HFA) 108 (90 Base) MCG/ACT inhaler 1-2 puffs as needed    . Ascorbic Acid (VITAMIN C PO) Take by mouth. Takes most days    . aspirin 81 MG EC tablet Take 81 mg by mouth daily.    . bimatoprost (LUMIGAN) 0.01 % SOLN Place 1 drop into  both eyes at bedtime.    . calcium citrate-vitamin D (CITRACAL+D) 315-200 MG-UNIT per tablet Take 1 tablet by mouth daily.    . cetirizine (ZYRTEC) 10 MG tablet Take 10 mg by mouth daily.    Marland Kitchen EPINEPHrine 0.3 mg/0.3 mL IJ SOAJ injection Inject into the muscle as directed.    . fluticasone (FLONASE) 50 MCG/ACT nasal spray Place 2 sprays into both nostrils 2 (two) times daily.    . Garlic 694 MG TABS Take 2 tablets by mouth 2 (two) times daily.    . montelukast (SINGULAIR) 10 MG tablet Take 10 mg by mouth at bedtime.    . Omega-3 Fatty Acids (OMEGA 3 PO) Take by mouth daily.    . Polyethylene Glycol 3350 (MIRALAX PO) Take by mouth. For colonoscopy 08-01-17    . potassium chloride (KLOR-CON) 10 MEQ tablet Take 2 tablets (20 mEq  total) by mouth daily. 180 tablet 3  . Triamcinolone Acetonide (NASACORT AQ NA) Place into the nose daily.    Marland Kitchen triamterene-hydrochlorothiazide (MAXZIDE-25) 37.5-25 MG tablet Take 1 tablet by mouth daily. 90 tablet 3  . vitamin D, CHOLECALCIFEROL, 400 UNITS tablet Take 400 Units by mouth daily.     No current facility-administered medications for this visit.     Allergies:   Cefuroxime, Cefuroxime axetil, Cefuroxime axetil, Clarithromycin, Clarithromycin, Levofloxacin, Levofloxacin, Lipitor [atorvastatin], Losartan, Other, Rosuvastatin, and Valsartan   Social History:  The patient  reports that she has never  smoked. She has never used smokeless tobacco. She reports that she does not drink alcohol and does not use drugs.   Family History:   family history includes Alcohol abuse in her brother; Cancer in her brother, father, and mother; Colon cancer (age of onset: 10) in her mother; Diabetes in her brother and brother; Heart disease in her father; Stroke in her father.    Review of Systems: Review of Systems  Constitutional: Negative.   HENT: Negative.   Respiratory: Negative.   Cardiovascular: Negative.   Gastrointestinal: Negative.   Musculoskeletal: Negative.    Neurological: Negative.   Psychiatric/Behavioral: Negative.   All other systems reviewed and are negative.   PHYSICAL EXAM: VS:  BP (!) 150/80 (BP Location: Right Arm, Patient Position: Sitting, Cuff Size: Normal)   Pulse 69   Ht 5\' 2"  (1.575 m)   Wt 147 lb (66.7 kg)   SpO2 94%   BMI 26.89 kg/m  , BMI Body mass index is 26.89 kg/m. GEN: Well nourished, well developed, in no acute distress HEENT: normal Neck: no JVD, carotid bruits, or masses Cardiac: RRR; no murmurs, rubs, or gallops,no edema  Respiratory:  clear to auscultation bilaterally, normal work of breathing GI: soft, nontender, nondistended, + BS MS: no deformity or atrophy Skin: warm and dry, no rash Neuro:  Strength and sensation are intact Psych: euthymic mood, full affect    Recent Labs: 08/02/2020: ALT 12; BUN 11; Creatinine, Ser 0.86; Hemoglobin 14.0; Platelets 272.0; Potassium 4.1; Sodium 140; TSH 1.60    Lipid Panel Lab Results  Component Value Date   CHOL 249 (H) 08/02/2020   HDL 61.90 08/02/2020   LDLCALC 174 (H) 08/02/2020   TRIG 67.0 08/02/2020      Wt Readings from Last 3 Encounters:  10/18/20 147 lb (66.7 kg)  08/10/20 139 lb 1 oz (63.1 kg)  10/29/19 142 lb (64.4 kg)      ASSESSMENT AND PLAN:  Problem List Items Addressed This Visit      Cardiology Problems   Hyperlipidemia - Primary   Relevant Orders   EKG 12-Lead    Other Visit Diagnoses    Essential hypertension       Relevant Orders   EKG 12-Lead   CT CARDIAC SCORING (SELF PAY ONLY)   Encounter for screening for coronary artery disease          Relevant Orders   CT CARDIAC SCORING (SELF PAY ONLY)   Myalgia due to statin         Hyperlipidemia Long discussion concerning whether to treat or not Recommended a risk stratification study, Coronary calcium score ordered For elevated scoring could consider adding Zetia, trying a PCSK9 inhibitor Previous intolerance to WelChol, Crestor or Lipitor  Hypertension Blood  pressure elevated on today's visit, recommend she monitor blood pressure closely at home, further medication titration may be needed No changes made on today's visit      Total encounter time more than 45 minutes  Greater than 50% was spent in counseling and coordination of care with the patient    Signed, Esmond Plants, M.D., Ph.D. [place], [place]

## 2020-10-18 NOTE — Patient Instructions (Addendum)
Read Zetia, cholesterol pill   Medication Instructions:  No changes  If you need a refill on your cardiac medications before your next appointment, please call your pharmacy.    Lab work: No new labs needed  Testing/Procedures: We will order CT coronary calcium score $99 out of pocket expense  at our [facility] in [place]  This procedure uses special x-ray equipment to produce pictures of the coronary arteries to determine if they are blocked or narrowed by the buildup of plaque - an indicator for atherosclerosis or coronary artery disease (CAD).  Please call [phone] to schedule at your earliest convince   [address], [place] [address]    Follow-Up: At [facility], you and your health needs are our priority.  As part of our continuing mission to provide you with exceptional heart care, we have created designated Provider Care Teams.  These Care Teams include your primary Cardiologist (physician) and Advanced Practice Providers (APPs -  Physician Assistants and Nurse Practitioners) who all work together to provide you with the care you need, when you need it.  . You will need a follow up appointment as needed  . Providers on your designated Care Team:   . Murray Hodgkins, NP . Christell Faith, PA-C . Marrianne Mood, PA-C   COVID-19 Vaccine Information can be found at: [email or web address].uk For questions related to vaccine distribution or appointments, please email [email or web address] or call 2058677113.

## 2020-10-19 DIAGNOSIS — J3089 Other allergic rhinitis: Secondary | ICD-10-CM | POA: Diagnosis not present

## 2020-10-28 DIAGNOSIS — J3089 Other allergic rhinitis: Secondary | ICD-10-CM | POA: Diagnosis not present

## 2020-11-01 ENCOUNTER — Other Ambulatory Visit: Payer: Self-pay

## 2020-11-01 ENCOUNTER — Ambulatory Visit
Admission: RE | Admit: 2020-11-01 | Discharge: 2020-11-01 | Disposition: A | Discharge status: Home / Self Care | Payer: Medicare Other | Source: Ambulatory Visit | Attending: Family Medicine | Admitting: Family Medicine

## 2020-11-01 DIAGNOSIS — Z1231 Encounter for screening mammogram for malignant neoplasm of breast: Secondary | ICD-10-CM | POA: Diagnosis not present

## 2020-11-02 DIAGNOSIS — J3089 Other allergic rhinitis: Secondary | ICD-10-CM | POA: Diagnosis not present

## 2020-11-02 IMAGING — MR MR BRAIN/IAC WO/W CM
10 of 14 series · 27 of 48 positions shown · IV contrast (gadavist)
Comparison: Head CT March 19, 2015

CLINICAL DATA: Lightheadedness.

EXAM:
MRI HEAD WITHOUT AND WITH CONTRAST
TECHNIQUE: Multiplanar, multiecho pulse sequences of the brain and surrounding
structures were obtained without and with intravenous contrast.
CONTRAST:  6mL GADAVIST GADOBUTROL 1 MMOL/ML IV SOLN

[Series 9: T1 · sagittal · 5.0mm · 0.62mm/px · 3 of 22 slices shown (1 of 3)]
[im 1/22]
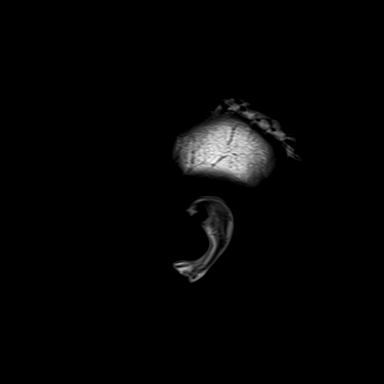
[im 11/22]
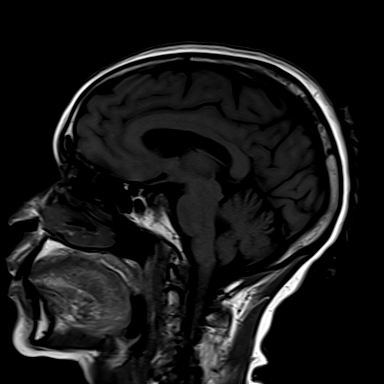
[im 22/22]
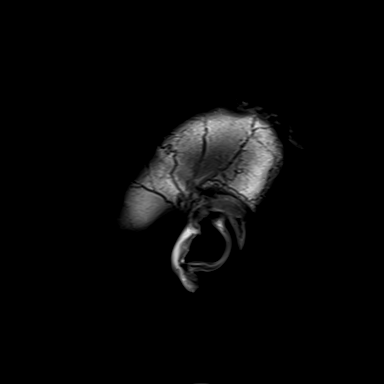

[Series 10: ax dwi_tracew · axial · 3.0mm · 0.60mm/px · z∈[-85,+71]mm · 3 of 48 slices shown]
[im 1/48]
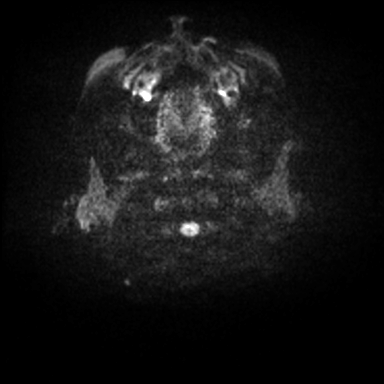
[im 24/48]
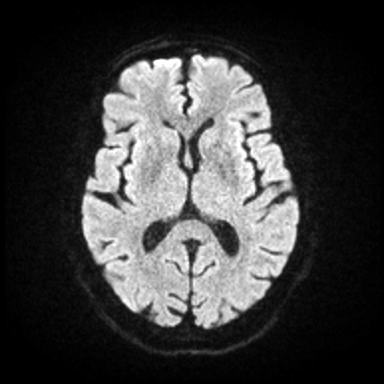
[im 48/48]
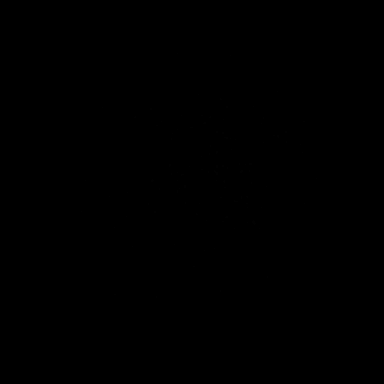

[Series 11: ax dwi_adc · axial · 3.0mm · 0.60mm/px · z∈[-85,-9]mm · 2 of 47 slices shown]
[im 1/47]
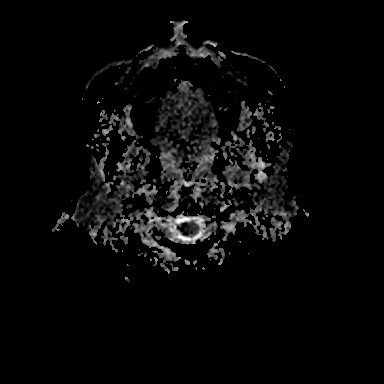
[im 24/47]
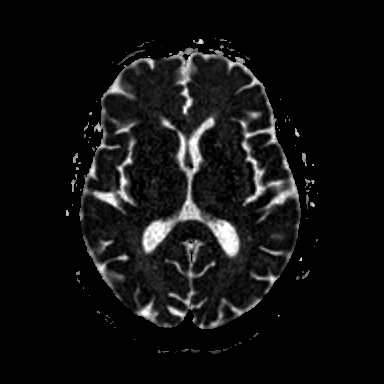

[Series 12: T2 · axial · 5.0mm · 0.53mm/px · z∈[-79,+65]mm · 2 of 25 slices shown]
[im 1/25]
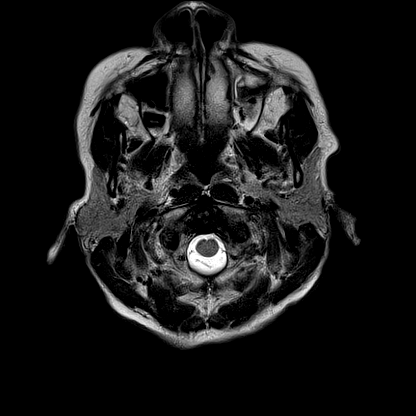
[im 25/25]
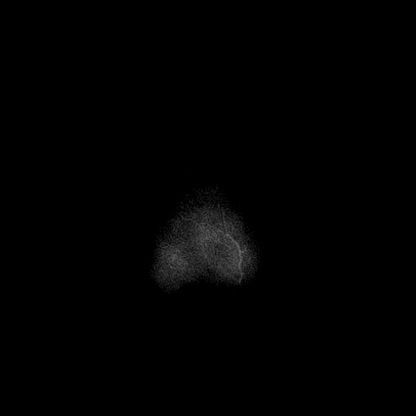

[Series 17: FLAIR · axial · 3.0mm · 0.53mm/px · z∈[-88,+74]mm · 4 of 55 slices shown]
[im 1/55]
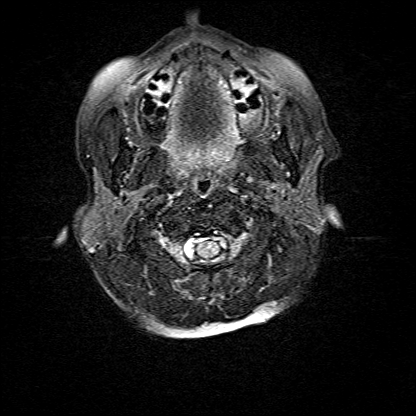
[im 19/55]
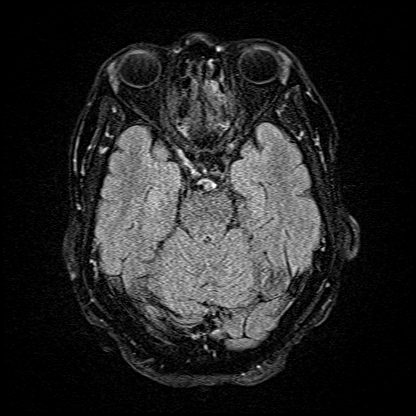
[im 37/55]
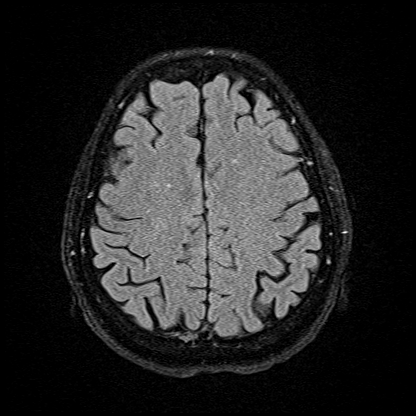
[im 55/55]
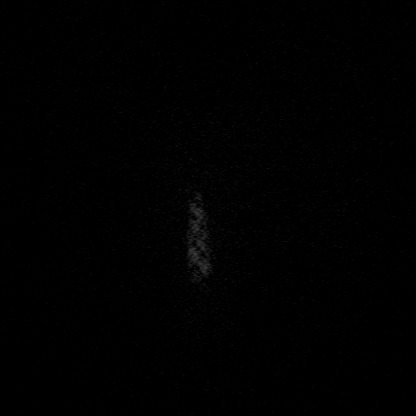

[Series 18: T1 · coronal · non-contrast · 3.0mm · 0.21mm/px · 1 of 13 slices shown (2 of 3)]
[im 1/13]
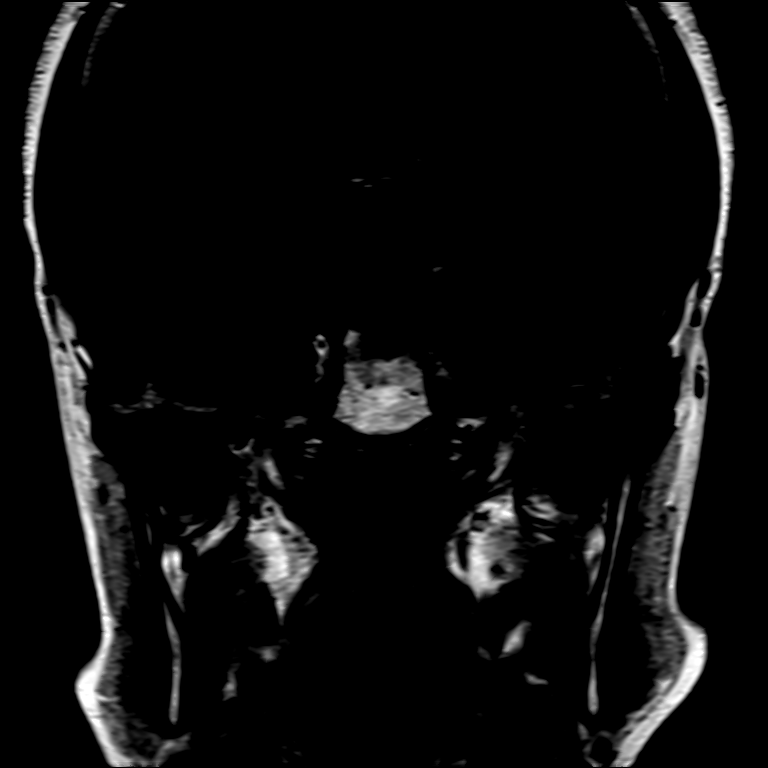

[Series 20: T1 · axial · non-contrast · 3.0mm · 0.21mm/px · 1 of 15 slices shown (3 of 3)]
[im 1/15]
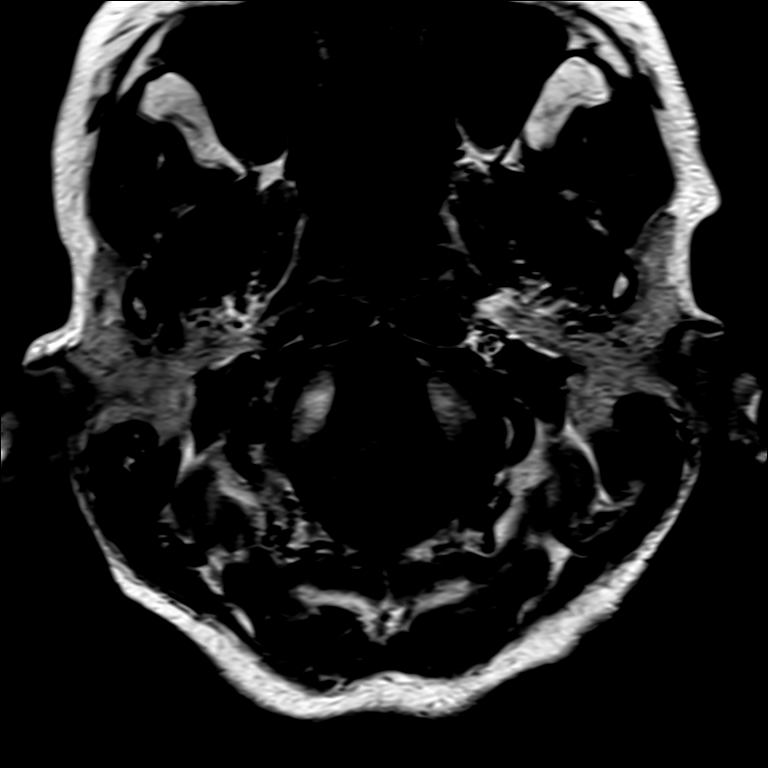

[Series 21: T1 post-contrast · axial · 3.0mm · 0.21mm/px · 1 of 15 slices shown (1 of 3)]
[im 1/15]
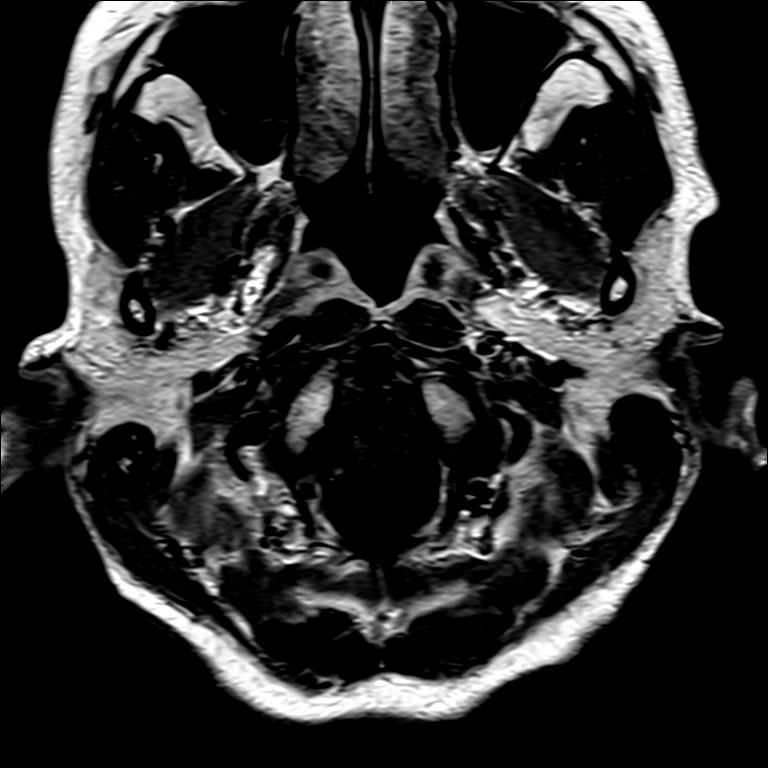

[Series 22: T1 post-contrast · coronal · 3.0mm · 0.21mm/px · 1 of 13 slices shown (2 of 3)]
[im 1/13]
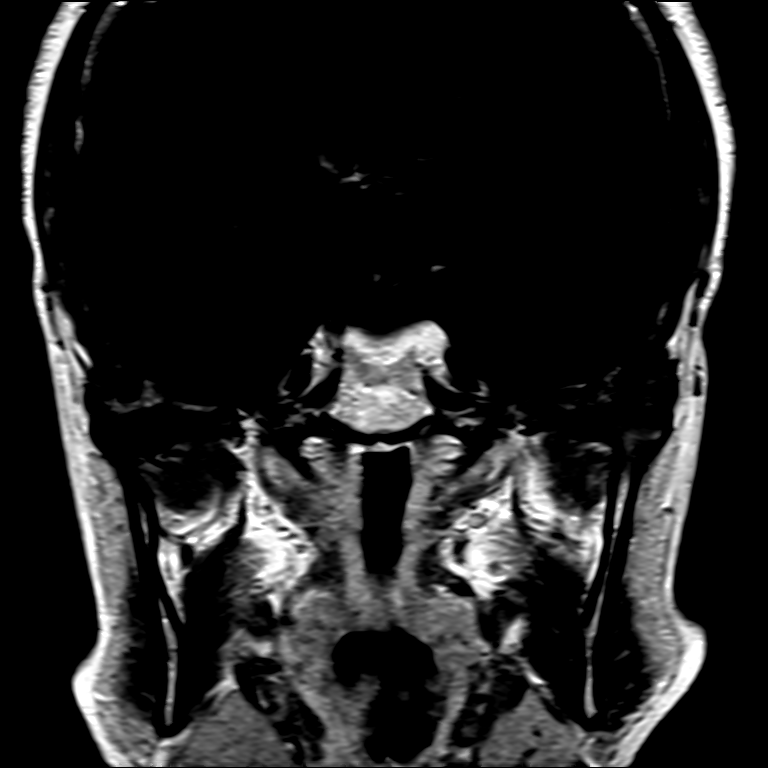

[Series 23: T1 post-contrast · axial · 1.0mm · 0.98mm/px · z∈[-95,+80]mm · 9 of 176 slices shown (3 of 3)]
[im 1/176]
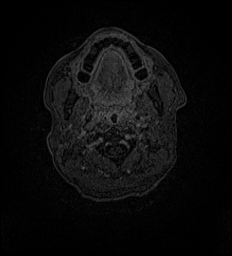
[im 30/176]
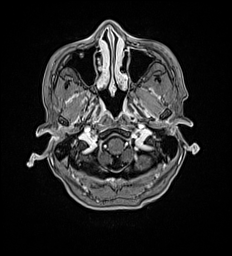
[im 59/176]
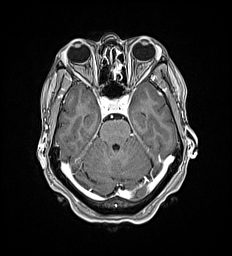
[im 73/176]
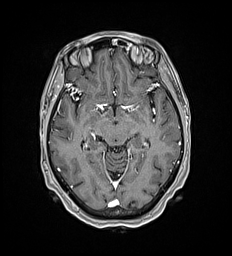
[im 88/176]
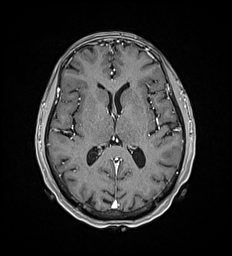
[im 103/176]
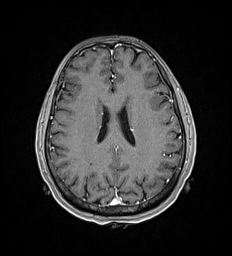
[im 117/176]
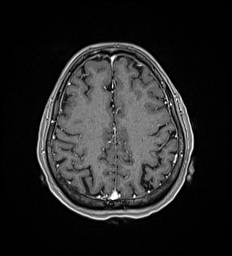
[im 146/176]
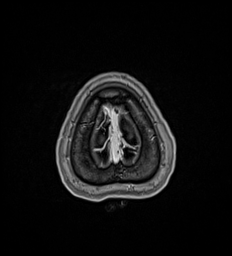
[im 176/176]
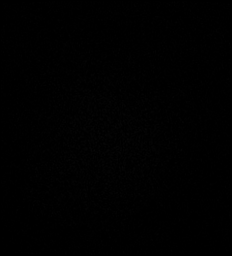

[27 of 48 positions shown; findings below may reference images not displayed]

FINDINGS: Brain: No acute infarction, hemorrhage, hydrocephalus, extra-axial
collection or mass lesion.

Patchy mild T2 hyperintensity of the periventricular white matter,
nonspecific, most likely related to chronic small vessel ischemia.

No cerebellopontine angle mass or internal auditory canal lesion is
demonstrated.Normal appearance of the 7th and 8th cranial nerves
bilaterally.

No focus of abnormal contrast enhancement identified.

Vascular: Normal flow voids.

Skull and upper cervical spine: Normal marrow signal.

Sinuses/Orbits: Postsurgical changes from bilateral ethmoidectomy
and antrostomy. Obliteration of the left frontal sinus with T1 and
T2 hyperintense content, suggestive of calcification/ossification.
The orbits are preserved.
IMPRESSION: 1. No acute intracranial abnormality.
2. No cerebellopontine angle mass or internal auditory canal lesion.
3. Mild chronic small vessel ischemic changes.

## 2020-11-09 DIAGNOSIS — J3089 Other allergic rhinitis: Secondary | ICD-10-CM | POA: Diagnosis not present

## 2020-11-18 DIAGNOSIS — J3089 Other allergic rhinitis: Secondary | ICD-10-CM | POA: Diagnosis not present

## 2020-11-23 DIAGNOSIS — J3089 Other allergic rhinitis: Secondary | ICD-10-CM | POA: Diagnosis not present

## 2020-11-26 NOTE — Discharge Instructions (Signed)
Instructions after Total Knee Replacement   Amy Herman, Jr., M.D.     Dept. of Orthopaedics & Sports Medicine  [facility]  [address]  [place], [place]  [address]  Phone: [phone]   Fax: [phone]    DIET: Drink plenty of non-alcoholic fluids. Resume your normal diet. Include foods high in fiber.  ACTIVITY:  You may use crutches or a walker with weight-bearing as tolerated, unless instructed otherwise. You may be weaned off of the walker or crutches by your Physical Therapist.  Do NOT place pillows under the knee. Anything placed under the knee could limit your ability to straighten the knee.   Continue doing gentle exercises. Exercising will reduce the pain and swelling, increase motion, and prevent muscle weakness.   Please continue to use the TED compression stockings for 6 weeks. You may remove the stockings at night, but should reapply them in the morning. Do not drive or operate any equipment until instructed.  WOUND CARE:  Continue to use the PolarCare or ice packs periodically to reduce pain and swelling. You may bathe or shower after the staples are removed at the first office visit following surgery.  MEDICATIONS: You may resume your regular medications. Please take the pain medication as prescribed on the medication. Do not take pain medication on an empty stomach. You have been given a prescription for a blood thinner (Lovenox or Coumadin). Please take the medication as instructed. (NOTE: After completing a 2 week course of Lovenox, take one Enteric-coated aspirin once a day. This along with elevation will help reduce the possibility of phlebitis in your operated leg.) Do not drive or drink alcoholic beverages when taking pain medications.  CALL THE OFFICE FOR: Temperature above 101 degrees Excessive bleeding or drainage on the dressing. Excessive swelling, coldness, or paleness of the toes. Persistent nausea and vomiting.  FOLLOW-UP:  You  should have an appointment to return to the office in 10-14 days after surgery. Arrangements have been made for continuation of Physical Therapy (either home therapy or outpatient therapy).   [facility] Department Directory         [email or web address]       [email or web address]          Cardiology  Appointments: [place] - [phone] [place] - [phone]  Endocrinology  Appointments: [place] - [phone] [place] - [phone]  Gastroenterology  Appointments: [place] - [phone] [place] - [phone]        General Surgery   Appointments: [place] - [phone]  Internal Medicine/Family Medicine  Appointments: [place] - [phone] [place] - [phone] [place] - [phone]  Metabolic and Weigh Loss Surgery  Appointments: [place] - [phone]        Neurology  Appointments: [place] - [phone] [place] - [phone]  Neurosurgery  Appointments: [place] - [phone]  Obstetrics & Gynecology  Appointments: [place] - [phone] [place] - [phone]        Pediatrics  Appointments: [place] - [phone] [place] - [phone]  Physiatry  Appointments: [place] -[phone]  Physical Therapy  Appointments: [place] - [phone] [place] - [phone]        Podiatry  Appointments: [place] - [phone] [place] - [phone]  Pulmonology  Appointments: [place] - [phone]  Rheumatology  Appointments: [place] - [phone]        [place] Location: [facility]  [address] , [place]  [address]  [place] Location: [facility] [address] [place], [place]  [address]  [place] Location: [facility] [address] [place], [place]  [address]    

## 2020-12-02 DIAGNOSIS — J3089 Other allergic rhinitis: Secondary | ICD-10-CM | POA: Diagnosis not present

## 2020-12-07 DIAGNOSIS — J3089 Other allergic rhinitis: Secondary | ICD-10-CM | POA: Diagnosis not present

## 2020-12-09 ENCOUNTER — Other Ambulatory Visit: Payer: Self-pay

## 2020-12-09 ENCOUNTER — Encounter (HOSPITAL_COMMUNITY): Payer: Self-pay | Admitting: Urgent Care

## 2020-12-09 ENCOUNTER — Other Ambulatory Visit
Admission: RE | Admit: 2020-12-09 | Discharge: 2020-12-09 | Disposition: A | Discharge status: Home / Self Care | Payer: Medicare Other | Source: Ambulatory Visit | Attending: Orthopedic Surgery | Admitting: Orthopedic Surgery

## 2020-12-09 DIAGNOSIS — Z01812 Encounter for preprocedural laboratory examination: Secondary | ICD-10-CM | POA: Insufficient documentation

## 2020-12-09 LAB — TYPE AND SCREEN
ABO/RH(D): O POS
Antibody Screen: NEGATIVE

## 2020-12-09 LAB — COMPREHENSIVE METABOLIC PANEL
ALT: 21 U/L (ref 0–44)
AST: 26 U/L (ref 15–41)
Albumin: 3.9 g/dL (ref 3.5–5.0)
Alkaline Phosphatase: 89 U/L (ref 38–126)
Anion gap: 9 (ref 5–15)
BUN: 17 mg/dL (ref 8–23)
CO2: 28 mmol/L (ref 22–32)
Calcium: 9.1 mg/dL (ref 8.9–10.3)
Chloride: 101 mmol/L (ref 98–111)
Creatinine, Ser: 0.72 mg/dL (ref 0.44–1.00)
GFR, Estimated: >60 mL/min (ref >60)
Glucose, Bld: 112 mg/dL — ABNORMAL HIGH (ref 70–99)
Potassium: 3.1 mmol/L — ABNORMAL LOW (ref 3.5–5.1)
Sodium: 138 mmol/L (ref 135–145)
Total Bilirubin: 0.6 mg/dL (ref 0.3–1.2)
Total Protein: 7.5 g/dL (ref 6.5–8.1)

## 2020-12-09 LAB — APTT: aPTT: 29 seconds (ref 24–36)

## 2020-12-09 LAB — URINALYSIS, ROUTINE W REFLEX MICROSCOPIC
Bacteria, UA: NONE SEEN
Bilirubin Urine: NEGATIVE
Glucose, UA: NEGATIVE mg/dL
Hgb urine dipstick: NEGATIVE
Ketones, ur: NEGATIVE mg/dL
Nitrite: NEGATIVE
Protein, ur: NEGATIVE mg/dL
Specific Gravity, Urine: 1.017 (ref 1.005–1.030)
pH: 6 (ref 5.0–8.0)

## 2020-12-09 LAB — C-REACTIVE PROTEIN: CRP: 0.6 mg/dL (ref <1.0)

## 2020-12-09 LAB — CBC
HCT: 41.8 % (ref 36.0–46.0)
Hemoglobin: 14 g/dL (ref 12.0–15.0)
MCH: 29.6 pg (ref 26.0–34.0)
MCHC: 33.5 g/dL (ref 30.0–36.0)
MCV: 88.4 fL (ref 80.0–100.0)
Platelets: 348 10*3/uL (ref 150–400)
RBC: 4.73 MIL/uL (ref 3.87–5.11)
RDW: 11.9 % (ref 11.5–15.5)
WBC: 6.5 10*3/uL (ref 4.0–10.5)
nRBC: 0 % (ref 0.0–0.2)

## 2020-12-09 LAB — SURGICAL PCR SCREEN
MRSA, PCR: NEGATIVE
Staphylococcus aureus: NEGATIVE

## 2020-12-09 LAB — PROTIME-INR
INR: 1 (ref 0.8–1.2)
Prothrombin Time: 12.7 seconds (ref 11.4–15.2)

## 2020-12-09 LAB — SEDIMENTATION RATE: Sed Rate: 14 mm/hr (ref 0–30)

## 2020-12-09 NOTE — Patient Instructions (Addendum)
Your procedure is scheduled on: Monday 12/20/20 Report to [facility]. To find out your arrival time please call [phone] between 1PM - 3PM on Friday 12/17/20.  Remember: Instructions that are not followed completely may result in serious medical risk, up to and including death, or upon the discretion of your surgeon and anesthesiologist your surgery may need to be rescheduled.     _X__ 1. Do not eat food after midnight the night before your procedure.                 No gum chewing or hard candies. You may drink clear liquids up to 2 hours                 before you are scheduled to arrive for your surgery- DO not drink clear                 liquids within 2 hours of the start of your surgery.                 Clear Liquids include:  water, apple juice without pulp, clear carbohydrate                 drink such as Clearfast or Gatorade, Black Coffee or Tea (Do not add                 anything to coffee or tea). Diabetics water only  __X__2.  On the morning of surgery brush your teeth with toothpaste and water, you                 may rinse your mouth with mouthwash if you wish.  Do not swallow any              toothpaste of mouthwash.     _X__ 3.  No Alcohol for 24 hours before or after surgery.   _X__ 4.  Do Not Smoke or use e-cigarettes For 24 Hours Prior to Your Surgery.                 Do not use any chewable tobacco products for at least 6 hours prior to                 surgery.  ____  5.  Bring all medications with you on the day of surgery if instructed.   __X__  6.  Notify your doctor if there is any change in your medical condition      (cold, fever, infections).     Do not wear jewelry, make-up especially eye makeup, hairpins, clips or nail polish or toenail polish. Do not wear lotions, powders, or perfumes.  Do not shave 48 hours prior to surgery. Men may shave face and neck. Do not bring valuables to the hospital.     [facility] is not responsible for any belongings or valuables.  Contacts, dentures/partials or body piercings may not be worn into surgery. Bring a case for your contacts, glasses or hearing aids, a denture cup will be supplied. Leave your suitcase in the car. After surgery it may be brought to your room. For patients admitted to the hospital, discharge time is determined by your treatment team.   Patients discharged the day of surgery will not be allowed to drive home.   Please read over the following fact sheets that you were given:   MRSA Information, CHG soap, EMMI,  __X__ Take these medicines  the morning of surgery with A SIP OF WATER:    1. cetirizine (ZYRTEC) 10 MG tablet  2. fluticasone (FLONASE) 50 MCG/ACT nasal spray  3. omeprazole (PRILOSEC) 20 MG capsule  4.  5.  6.  ____ Fleet Enema (as directed)   __X__ Use CHG Soap/SAGE wipes as directed  __X__ Use inhalers on the day of surgery  ____ Stop metformin/Janumet/Farxiga 2 days prior to surgery    ____ Take 1/2 of usual insulin dose the night before surgery. No insulin the morning          of surgery.   ____ Stop Blood Thinners Coumadin/Plavix/Xarelto/Pleta/Pradaxa/Eliquis/Effient/Aspirin  on   Or contact your Surgeon, Cardiologist or Medical Doctor regarding  ability to stop your blood thinners  __X__ Stop Anti-inflammatories 7 days before surgery such as Advil, Ibuprofen, Motrin,  BC or Goodies Powder, Naprosyn, Naproxen, Aleve, Aspirin    __X__ Stop all herbal supplements, fish oil or vitamin E until after surgery. STOP GARLIC AND FISH OIL 7 DAYS BEFORE SURGERY   ____ Bring C-Pap to the hospital.

## 2020-12-09 NOTE — Progress Notes (Signed)
  [facility] Perioperative Services: Pre-Admission/Anesthesia Testing  Abnormal Lab Notification   Date: 12/09/20  Name: Amy Herman MRN:   [id number]  Re: Abnormal labs noted during PAT appointment   Provider(s) Notified: Dereck Leep, MD Notification mode: Routed and/or faxed via [facility] LAB VALUE(S): Lab Results  Component Value Date   K 3.1 (L) 12/09/2020   Notes:  Patient is scheduled for a COMPUTER ASSISTED TOTAL KNEE ARTHROPLASTY (Right: Knee) on 12/20/2020.  In review of patient's medication reconciliation list, it is noted that patient is on daily diuretic therapy (Maxide 25 mg).  Patient already taking Klor-Con 20 mEq daily.  Will forward results to primary attending surgeon for review and optimization.  Order entered to have K+ level rechecked on the day of her surgery to ensure correction of noted derangement.    This is a Community education officer; no formal response is required.  Honor Loh, MSN, APRN, FNP-C, [facility]  Peri-operative Services Nurse Practitioner Phone: [phone] Fax: [phone] 12/09/20 2:10 PM

## 2020-12-10 LAB — URINE CULTURE: Special Requests: NORMAL

## 2020-12-14 DIAGNOSIS — M25561 Pain in right knee: Secondary | ICD-10-CM | POA: Diagnosis not present

## 2020-12-16 ENCOUNTER — Other Ambulatory Visit: Payer: Self-pay

## 2020-12-16 ENCOUNTER — Other Ambulatory Visit
Admission: RE | Admit: 2020-12-16 | Discharge: 2020-12-16 | Disposition: A | Discharge status: Home / Self Care | Payer: Medicare Other | Source: Ambulatory Visit | Attending: Orthopedic Surgery | Admitting: Orthopedic Surgery

## 2020-12-16 DIAGNOSIS — U071 COVID-19: Secondary | ICD-10-CM | POA: Diagnosis not present

## 2020-12-16 DIAGNOSIS — Z01812 Encounter for preprocedural laboratory examination: Secondary | ICD-10-CM | POA: Diagnosis present

## 2020-12-16 LAB — SARS CORONAVIRUS 2 (TAT 6-24 HRS): SARS Coronavirus 2: POSITIVE — AB

## 2020-12-17 ENCOUNTER — Telehealth: Payer: Self-pay | Admitting: Family Medicine

## 2020-12-17 MED ORDER — MOLNUPIRAVIR EUA 200MG CAPSULE: 4.0000 | ORAL_CAPSULE | Freq: Two times a day (BID) | ORAL | 0 refills | Status: AC

## 2020-12-17 NOTE — Addendum Note (Signed)
Addended by: Loura Pardon A on: 12/17/2020 05:38 PM   Modules accepted: Orders

## 2020-12-17 NOTE — Telephone Encounter (Signed)
I saw result in epic Pt has some sinus congestion   Due to age I sent in molnupiravir to take as directed ER parameters discussed Will update [place] Monday  Disc isolation and symptom tx The have cancelled her knee repl surgery for now

## 2020-12-17 NOTE — Telephone Encounter (Signed)
Patient is positive for covid. Showing no symptoms. Patient is wanting  to know what she needs to do. She is to see her attorney today. She wants a call back to discuss. EM

## 2020-12-21 ENCOUNTER — Telehealth: Payer: Self-pay | Admitting: Family Medicine

## 2020-12-21 NOTE — Telephone Encounter (Signed)
Spoke to pt. She said her symptoms are improving and wanted to know once she finishes the medication tomorrow what she should do. I advised her that CDC says 5 days from the onset of symptoms as long as she is feeling better. Wear a mask while around others for a few more days.

## 2020-12-21 NOTE — Telephone Encounter (Signed)
She tested positive for covid and was prescribed medication for 5 days and tomorrow she is on her 5th day and wanted to know about what to do next.    Please advise

## 2020-12-23 ENCOUNTER — Telehealth: Payer: Self-pay

## 2020-12-23 NOTE — Telephone Encounter (Signed)
Patient called stating that she took her last dose of Paxlovid on 12/22/20 for COVID and she wanted to know how long to wait until she can get COVID booster shot now? Also, she was suppose to get allergy shots and this week is the week for that but she did not get that today and wanted to see when she should get those shots?  Patient already had 1st booster shot before and wants to get the 2nd booster. Will there be a new COVID booster vaccine in the fall and if so should she just wait for that?  Please advise when able to.

## 2020-12-23 NOTE — Telephone Encounter (Signed)
Please wait on the 2nd booster until the fall  (since she just had covid and is not immunocompromised) Ok to get allergy shots as long as she feels better  Thanks

## 2020-12-24 NOTE — Telephone Encounter (Signed)
Pt notified of Dr. Tower's comments and verbalized understanding  

## 2021-01-06 DIAGNOSIS — J3089 Other allergic rhinitis: Secondary | ICD-10-CM | POA: Diagnosis not present

## 2021-01-11 DIAGNOSIS — J3089 Other allergic rhinitis: Secondary | ICD-10-CM | POA: Diagnosis not present

## 2021-01-18 DIAGNOSIS — J3089 Other allergic rhinitis: Secondary | ICD-10-CM | POA: Diagnosis not present

## 2021-01-25 DIAGNOSIS — J3089 Other allergic rhinitis: Secondary | ICD-10-CM | POA: Diagnosis not present

## 2021-02-01 DIAGNOSIS — J3089 Other allergic rhinitis: Secondary | ICD-10-CM | POA: Diagnosis not present

## 2021-02-04 ENCOUNTER — Encounter: Admission: RE | Payer: Self-pay | Source: Home / Self Care

## 2021-02-04 ENCOUNTER — Inpatient Hospital Stay: Admission: RE | Admit: 2021-02-04 | Payer: Medicare Other | Source: Home / Self Care | Admitting: Orthopedic Surgery

## 2021-02-04 SURGERY — ARTHROPLASTY, KNEE, TOTAL, USING IMAGELESS COMPUTER-ASSISTED NAVIGATION
Anesthesia: Choice | Site: Knee | Laterality: Right

## 2021-02-10 DIAGNOSIS — J301 Allergic rhinitis due to pollen: Secondary | ICD-10-CM | POA: Diagnosis not present

## 2021-02-10 DIAGNOSIS — R059 Cough, unspecified: Secondary | ICD-10-CM | POA: Diagnosis not present

## 2021-02-10 DIAGNOSIS — J3089 Other allergic rhinitis: Secondary | ICD-10-CM | POA: Diagnosis not present

## 2021-02-14 ENCOUNTER — Telehealth: Payer: Self-pay | Admitting: Family Medicine

## 2021-02-14 NOTE — Telephone Encounter (Signed)
Ms. Yisell called in and wanted to know if Dr. Glori Bickers could fit her in due to feels like BP is elevated but she feels fine. Offered her an appointment on Wednesday but wanted to see if she can get in before then.

## 2021-02-14 NOTE — Telephone Encounter (Signed)
Please put her in 12:30 if available Tuesday, thanks

## 2021-02-15 ENCOUNTER — Ambulatory Visit (INDEPENDENT_AMBULATORY_CARE_PROVIDER_SITE_OTHER): Payer: Medicare Other | Admitting: Family Medicine

## 2021-02-15 ENCOUNTER — Other Ambulatory Visit: Payer: Self-pay

## 2021-02-15 ENCOUNTER — Encounter: Payer: Self-pay | Admitting: Family Medicine

## 2021-02-15 VITALS — BP 146/92 | HR 89 | Temp 98.5°F | Ht 60.5 in | Wt 139.4 lb

## 2021-02-15 DIAGNOSIS — I10 Essential (primary) hypertension: Secondary | ICD-10-CM

## 2021-02-15 DIAGNOSIS — J3089 Other allergic rhinitis: Secondary | ICD-10-CM | POA: Diagnosis not present

## 2021-02-15 DIAGNOSIS — J301 Allergic rhinitis due to pollen: Secondary | ICD-10-CM

## 2021-02-15 DIAGNOSIS — H401131 Primary open-angle glaucoma, bilateral, mild stage: Secondary | ICD-10-CM | POA: Diagnosis not present

## 2021-02-15 MED ORDER — AMLODIPINE BESYLATE 5 MG PO TABS
5.0000 mg | ORAL_TABLET | Freq: Every day | ORAL | 3 refills | Status: DC
Start: 2021-02-15 — End: 2021-09-19

## 2021-02-15 NOTE — Telephone Encounter (Signed)
CALLED THE PATIENT AND SHE AGREED WITH THE 1230 SLOT TODAY

## 2021-02-15 NOTE — Assessment & Plan Note (Signed)
bp has trended up  BP Readings from Last 1 Encounters:  02/15/21 (!) 146/92  unsure if this is responsible for feeling of head pressure Most recent labs reviewed as well as old notes re: HTN hx Disc lifstyle change with low sodium diet and exercise  Plan to continue traimt-hct 37.5-25 mg daily  Add amlodipine 5 mg daily  inst to call if side eff Watch bp at home (info given re: how to do correctly) Keep up the good health habits F/u 1-2 mo

## 2021-02-15 NOTE — Telephone Encounter (Signed)
I will see her then  

## 2021-02-15 NOTE — Patient Instructions (Addendum)
Continue your current medication  Add amlodipine 5 mg daily  If any side effects or low blood pressure (90/50 or lower) stop it and let me know   Follow up in 1-2 months   You can keep track of blood pressure at home also  Continue your allergy medicines   If head pressure does not improve please let me know

## 2021-02-15 NOTE — Progress Notes (Signed)
Subjective:    Patient ID: Amy Herman, female    DOB: Jul 30, 1945, 75 y.o.   MRN: [id number]  This visit occurred during the SARS-CoV-2 public health emergency.  Safety protocols were in place, including screening questions prior to the visit, additional usage of staff PPE, and extensive cleaning of exam room while observing appropriate contact time as indicated for disinfecting solutions.   HPI Pt presents for f/u of HTN , also head pressure   Wt Readings from Last 3 Encounters:  02/15/21 139 lb 6 oz (63.2 kg)  12/09/20 143 lb (64.9 kg)  10/18/20 147 lb (66.7 kg)   26.77 kg/m  Pt came in feeling like her bp may be up  Some pressure in forehead (not cheeks) -not congested  Irrigates daily  Uses flonase  No ST Feels ok     HTN  BP Readings from Last 3 Encounters:  02/15/21 (!) 146/92  12/09/20 (!) 141/79  10/18/20 (!) 150/80   Triamterine-hctz 37.5-25 mg daily  K supplement   In the past losartan caused cough   Pulse Readings from Last 3 Encounters:  02/15/21 89  12/09/20 84  10/18/20 69     Lab Results  Component Value Date   CREATININE 0.72 12/09/2020   BUN 17 12/09/2020   NA 138 12/09/2020   K 3.1 (L) 12/09/2020   CL 101 12/09/2020   CO2 28 12/09/2020   K was low when she ran out of K  Back on track   Patient Active Problem List   Diagnosis Date Noted   Allergic rhinitis 09/16/2020   Cough 09/16/2020   Allergic rhinitis due to pollen 08/15/2020   Statin myopathy 08/10/2020   Dizziness 07/09/2019   Medicare annual wellness visit, subsequent 05/09/2019   Medicare annual wellness visit, initial 03/29/2018   Left knee pain 12/13/2017   Primary osteoarthritis of left knee 12/13/2017   History of UTI 11/11/2017   Encounter for screening mammogram for breast cancer 05/08/2016   Estrogen deficiency 02/16/2016   Contact with or exposure to communicable disease 02/16/2016   Pre-operative cardiovascular examination 07/21/2013   GERD  (gastroesophageal reflux disease) 06/18/2013   Varicose veins of lower extremities with other complications 0000000   Lumbar disc disease 08/15/2011   Joint pain 08/15/2011   Osteopenia 01/31/2011   Post-menopausal 01/31/2011   Hypokalemia 01/31/2011   Hyperlipidemia 03/24/2008   ESSENTIAL HYPERTENSION, BENIGN 03/24/2008   COLONIC POLYPS, HX OF 03/24/2008   Past Medical History:  Diagnosis Date   Allergic rhinitis    Allergy    Bronchitis    more frequent in the past   Cataract    "suspect per pt"   Colon polyps    history of   GERD (gastroesophageal reflux disease)    Glaucoma    suspected   Heart murmur    Hyperlipidemia    Hypertension    Osteoarthritis of knee    Osteopenia    Past Surgical History:  Procedure Laterality Date   BUNIONECTOMY     bilateral-PIN LEFT GREAT TOE   CARPAL TUNNEL RELEASE     COLONOSCOPY     FUNCTIONAL ENDOSCOPIC SINUS SURGERY     05-04-2011   HYSTERECTOMY     only removed uterus   KNEE ARTHROSCOPY     right   TUBAL LIGATION     bilateral   VEIN SURGERY  2006   legs   Social History   Tobacco Use   Smoking status: Never   Smokeless tobacco: Never  Tobacco comments:    used to have secondary smoke exposure from husband  Vaping Use   Vaping Use: Never used  Substance Use Topics   Alcohol use: No    Alcohol/week: 0.0 standard drinks   Drug use: No   Family History  Problem Relation Age of Onset   Cancer Mother        colon, lungs, liver   Colon cancer Mother 54   Cancer Father        prostate   Heart disease Father        A-fib   Stroke Father        TIA's   Alcohol abuse Brother    Cancer Brother        pancreatic, smoker   Diabetes Brother    Diabetes Brother    Stomach cancer Neg Hx    Esophageal cancer Neg Hx    Rectal cancer Neg Hx    Breast cancer Neg Hx    Allergies  Allergen Reactions   Cefuroxime Axetil     REACTION: bumps on tounge   Cefuroxime Axetil Other (See Comments)    Other  reaction(s): RASH Other reaction(s): RASH REACTION: bumps on tounge   Clarithromycin Other (See Comments)    Other reaction(s): OTHER Other reaction(s): OTHER REACTION: GI Biaxin    Levofloxacin Other (See Comments)    Joint pains   Lipitor [Atorvastatin] Other (See Comments)    Muscle pain  Muscle pain    Losartan Other (See Comments)    cough cough   Other     Other reaction(s): Unknown   Rosuvastatin Other (See Comments)    REACTION: severe muscle pain    Valsartan     Taken off from MD d/t a recall   Current Outpatient Medications on File Prior to Visit  Medication Sig Dispense Refill   acetaminophen (TYLENOL) 500 MG tablet Take 500 mg by mouth every 8 (eight) hours as needed for moderate pain.     albuterol (VENTOLIN HFA) 108 (90 Base) MCG/ACT inhaler Inhale 1 puff into the lungs every 6 (six) hours as needed for wheezing or shortness of breath.     aspirin 81 MG EC tablet Take 81 mg by mouth daily.     bimatoprost (LUMIGAN) 0.01 % SOLN Place 1 drop into both eyes at bedtime.     Brimonidine Tartrate (LUMIFY) 0.025 % SOLN Place 1 drop into both eyes every morning.     calcium citrate-vitamin D (CITRACAL+D) 315-200 MG-UNIT per tablet Take 1 tablet by mouth daily.     cetirizine (ZYRTEC) 10 MG tablet Take 10 mg by mouth daily.     Cholecalciferol (VITAMIN D3) 125 MCG (5000 UT) CAPS Take 5,000 Units by mouth daily.     EPINEPHrine 0.3 mg/0.3 mL IJ SOAJ injection Inject 0.3 mg into the muscle as needed for anaphylaxis.     fluticasone (FLONASE) 50 MCG/ACT nasal spray Place 2 sprays into both nostrils 2 (two) times daily.     Garlic 123XX123 MG CAPS Take 1,000 mg by mouth daily.     montelukast (SINGULAIR) 10 MG tablet Take 10 mg by mouth at bedtime as needed (allergies).     Omega 3 1000 MG CAPS Take 1,000 mg by mouth daily.     omeprazole (PRILOSEC) 20 MG capsule Take 20 mg by mouth daily as needed (acid reflux).     potassium chloride (KLOR-CON) 10 MEQ tablet Take 2 tablets  (20 mEq  total) by mouth daily. (Patient taking differently:  10 mEq 2 (two) times daily. Take 2 tablets (20 mEq  total) by mouth daily.) 180 tablet 3   Sodium Chloride-Sodium Bicarb (SINUS WASH NETI POT NA) Place 1 application into the nose 2 (two) times daily.     triamterene-hydrochlorothiazide (MAXZIDE-25) 37.5-25 MG tablet Take 1 tablet by mouth daily. 90 tablet 3   No current facility-administered medications on file prior to visit.    Review of Systems  Constitutional:  Negative for activity change, appetite change, fatigue, fever and unexpected weight change.  HENT:  Positive for sinus pressure. Negative for congestion, ear pain, rhinorrhea, sinus pain, sore throat and voice change.   Eyes:  Negative for pain, redness and visual disturbance.  Respiratory:  Negative for cough, shortness of breath and wheezing.   Cardiovascular:  Negative for chest pain and palpitations.  Gastrointestinal:  Negative for abdominal pain, blood in stool, constipation and diarrhea.  Endocrine: Negative for polydipsia and polyuria.  Genitourinary:  Negative for dysuria, frequency and urgency.  Musculoskeletal:  Negative for arthralgias, back pain and myalgias.  Skin:  Negative for pallor and rash.  Allergic/Immunologic: Negative for environmental allergies.  Neurological:  Negative for dizziness, syncope and headaches.       Head pressure-forehead No pain   Hematological:  Negative for adenopathy. Does not bruise/bleed easily.  Psychiatric/Behavioral:  Negative for decreased concentration and dysphoric mood. The patient is not nervous/anxious.       Objective:   Physical Exam Constitutional:      General: She is not in acute distress.    Appearance: Normal appearance. She is well-developed and normal weight. She is not ill-appearing or diaphoretic.  HENT:     Head: Normocephalic and atraumatic.     Comments: No sinus tenderness    Right Ear: Tympanic membrane, ear canal and external ear normal.      Left Ear: Tympanic membrane, ear canal and external ear normal.     Nose:     Comments: Nares are boggy and injected Eyes:     Conjunctiva/sclera: Conjunctivae normal.     Pupils: Pupils are equal, round, and reactive to light.  Neck:     Thyroid: No thyromegaly.     Vascular: No carotid bruit or JVD.  Cardiovascular:     Rate and Rhythm: Normal rate and regular rhythm.     Heart sounds: Normal heart sounds.    No gallop.  Pulmonary:     Effort: Pulmonary effort is normal. No respiratory distress.     Breath sounds: Normal breath sounds. No wheezing or rales.  Abdominal:     General: Bowel sounds are normal. There is no distension or abdominal bruit.     Palpations: Abdomen is soft. There is no mass.     Tenderness: There is no abdominal tenderness.  Musculoskeletal:     Cervical back: Normal range of motion and neck supple.     Right lower leg: No edema.     Left lower leg: No edema.  Lymphadenopathy:     Cervical: No cervical adenopathy.  Skin:    General: Skin is warm and dry.     Coloration: Skin is not pale.     Findings: No rash.  Neurological:     Mental Status: She is alert.     Coordination: Coordination normal.     Deep Tendon Reflexes: Reflexes are normal and symmetric. Reflexes normal.  Psychiatric:        Mood and Affect: Mood normal.  Cognition and Memory: Cognition and memory normal.          Assessment & Plan:   Problem List Items Addressed This Visit       Cardiovascular and Mediastinum   ESSENTIAL HYPERTENSION, BENIGN - Primary    bp has trended up  BP Readings from Last 1 Encounters:  02/15/21 (!) 146/92  unsure if this is responsible for feeling of head pressure Most recent labs reviewed as well as old notes re: HTN hx Disc lifstyle change with low sodium diet and exercise  Plan to continue traimt-hct 37.5-25 mg daily  Add amlodipine 5 mg daily  inst to call if side eff Watch bp at home (info given re: how to do correctly) Keep up  the good health habits F/u 1-2 mo       Relevant Medications   amLODipine (NORVASC) 5 MG tablet     Respiratory   Allergic rhinitis    Frontal head pressure may be from this (or bp) Enc her to continue allergy shots and flonase and antihistamine if needed  Watch for purulent drainage or fever or facial pain

## 2021-02-15 NOTE — Assessment & Plan Note (Signed)
Frontal head pressure may be from this (or bp) Enc her to continue allergy shots and flonase and antihistamine if needed  Watch for purulent drainage or fever or facial pain

## 2021-02-21 ENCOUNTER — Telehealth: Payer: Self-pay | Admitting: Family Medicine

## 2021-02-21 MED ORDER — AZITHROMYCIN 250 MG PO TABS: ORAL_TABLET | ORAL | 0 refills | Status: DC

## 2021-02-21 NOTE — Telephone Encounter (Signed)
I sent zithromax into her pharmacy Take as directed with food  Nasal saline spray as needed   Update if not starting to improve in a week or if worsening

## 2021-02-21 NOTE — Telephone Encounter (Signed)
Left message for patient that rx has been sent and to use nasal spray as needed. Advised to call back if no better in a week or if worsens.

## 2021-02-21 NOTE — Telephone Encounter (Signed)
Mrs. Lorah called in and stated that Dr. Glori Bickers told her if she was still having pressure in her sinus cavity that she would send in something. Her pharmacy is [facility] [address]

## 2021-02-21 NOTE — Addendum Note (Signed)
Addended by: Loura Pardon A on: 02/21/2021 12:53 PM   Modules accepted: Orders

## 2021-02-22 DIAGNOSIS — J3089 Other allergic rhinitis: Secondary | ICD-10-CM | POA: Diagnosis not present

## 2021-03-04 DIAGNOSIS — J3089 Other allergic rhinitis: Secondary | ICD-10-CM | POA: Diagnosis not present

## 2021-03-11 DIAGNOSIS — J301 Allergic rhinitis due to pollen: Secondary | ICD-10-CM | POA: Diagnosis not present

## 2021-03-15 DIAGNOSIS — J3081 Allergic rhinitis due to animal (cat) (dog) hair and dander: Secondary | ICD-10-CM | POA: Diagnosis not present

## 2021-03-15 DIAGNOSIS — J301 Allergic rhinitis due to pollen: Secondary | ICD-10-CM | POA: Diagnosis not present

## 2021-03-24 DIAGNOSIS — J3089 Other allergic rhinitis: Secondary | ICD-10-CM | POA: Diagnosis not present

## 2021-03-31 DIAGNOSIS — J3089 Other allergic rhinitis: Secondary | ICD-10-CM | POA: Diagnosis not present

## 2021-04-05 DIAGNOSIS — J3089 Other allergic rhinitis: Secondary | ICD-10-CM | POA: Diagnosis not present

## 2021-04-13 ENCOUNTER — Ambulatory Visit: Payer: Self-pay | Admitting: Family Medicine

## 2021-04-14 DIAGNOSIS — J3089 Other allergic rhinitis: Secondary | ICD-10-CM | POA: Diagnosis not present

## 2021-04-19 DIAGNOSIS — J3089 Other allergic rhinitis: Secondary | ICD-10-CM | POA: Diagnosis not present

## 2021-04-28 DIAGNOSIS — J3089 Other allergic rhinitis: Secondary | ICD-10-CM | POA: Diagnosis not present

## 2021-05-06 DIAGNOSIS — J3089 Other allergic rhinitis: Secondary | ICD-10-CM | POA: Diagnosis not present

## 2021-05-10 DIAGNOSIS — J3089 Other allergic rhinitis: Secondary | ICD-10-CM | POA: Diagnosis not present

## 2021-05-17 DIAGNOSIS — J3089 Other allergic rhinitis: Secondary | ICD-10-CM | POA: Diagnosis not present

## 2021-05-31 ENCOUNTER — Other Ambulatory Visit: Payer: Self-pay | Admitting: Family Medicine

## 2021-06-07 DIAGNOSIS — J3089 Other allergic rhinitis: Secondary | ICD-10-CM | POA: Diagnosis not present

## 2021-06-16 DIAGNOSIS — J3089 Other allergic rhinitis: Secondary | ICD-10-CM | POA: Diagnosis not present

## 2021-06-24 DIAGNOSIS — J3089 Other allergic rhinitis: Secondary | ICD-10-CM | POA: Diagnosis not present

## 2021-06-28 DIAGNOSIS — J3089 Other allergic rhinitis: Secondary | ICD-10-CM | POA: Diagnosis not present

## 2021-07-05 DIAGNOSIS — J3089 Other allergic rhinitis: Secondary | ICD-10-CM | POA: Diagnosis not present

## 2021-07-12 DIAGNOSIS — J3089 Other allergic rhinitis: Secondary | ICD-10-CM | POA: Diagnosis not present

## 2021-07-19 DIAGNOSIS — J3089 Other allergic rhinitis: Secondary | ICD-10-CM | POA: Diagnosis not present

## 2021-07-20 ENCOUNTER — Ambulatory Visit: Payer: Self-pay | Admitting: Family Medicine

## 2021-07-26 DIAGNOSIS — J3089 Other allergic rhinitis: Secondary | ICD-10-CM | POA: Diagnosis not present

## 2021-08-02 DIAGNOSIS — J3089 Other allergic rhinitis: Secondary | ICD-10-CM | POA: Diagnosis not present

## 2021-08-03 NOTE — Progress Notes (Deleted)
Subjective:   Amy Herman is a 76 y.o. female who presents for Medicare Annual (Subsequent) preventive examination.  I connected with Amy Herman today by telephone and verified that I am speaking with the correct person using two identifiers. Location patient: home Location provider: work Persons participating in the virtual visit: patient, Marine scientist.    I discussed the limitations, risks, security and privacy concerns of performing an evaluation and management service by telephone and the availability of in person appointments. I also discussed with the patient that there may be a patient responsible charge related to this service. The patient expressed understanding and verbally consented to this telephonic visit.    Interactive audio and video telecommunications were attempted between this provider and patient, however failed, due to patient having technical difficulties OR patient did not have access to video capability.  We continued and completed visit with audio only.  Some vital signs may be absent or patient reported.   Time Spent with patient on telephone encounter: *** minutes  Review of Systems           Objective:    There were no vitals filed for this visit. There is no height or weight on file to calculate BMI.  Advanced Directives 12/09/2020 08/03/2020 05/08/2016  Does Patient Have a Medical Advance Directive? No Yes No  Type of Advance Directive - [place];Living will -  Copy of [place] in Chart? - No - copy requested -  Would patient like information on creating a medical advance directive? No - Patient declined - No - patient declined information    Current Medications (verified) Outpatient Encounter Medications as of 08/04/2021  Medication Sig   acetaminophen (TYLENOL) 500 MG tablet Take 500 mg by mouth every 8 (eight) hours as needed for moderate pain.   albuterol (VENTOLIN HFA) 108 (90 Base) MCG/ACT inhaler Inhale 1  puff into the lungs every 6 (six) hours as needed for wheezing or shortness of breath.   amLODipine (NORVASC) 5 MG tablet Take 1 tablet (5 mg total) by mouth daily.   aspirin 81 MG EC tablet Take 81 mg by mouth daily.   azithromycin (ZITHROMAX Z-PAK) 250 MG tablet Take 2 pills by mouth today and then 1 pill daily for 4 days   bimatoprost (LUMIGAN) 0.01 % SOLN Place 1 drop into both eyes at bedtime.   Brimonidine Tartrate (LUMIFY) 0.025 % SOLN Place 1 drop into both eyes every morning.   calcium citrate-vitamin D (CITRACAL+D) 315-200 MG-UNIT per tablet Take 1 tablet by mouth daily.   cetirizine (ZYRTEC) 10 MG tablet Take 10 mg by mouth daily.   Cholecalciferol (VITAMIN D3) 125 MCG (5000 UT) CAPS Take 5,000 Units by mouth daily.   EPINEPHrine 0.3 mg/0.3 mL IJ SOAJ injection Inject 0.3 mg into the muscle as needed for anaphylaxis.   fluticasone (FLONASE) 50 MCG/ACT nasal spray Place 2 sprays into both nostrils 2 (two) times daily.   Garlic 7673 MG CAPS Take 1,000 mg by mouth daily.   montelukast (SINGULAIR) 10 MG tablet Take 10 mg by mouth at bedtime as needed (allergies).   Omega 3 1000 MG CAPS Take 1,000 mg by mouth daily.   omeprazole (PRILOSEC) 20 MG capsule Take 20 mg by mouth daily as needed (acid reflux).   potassium chloride (KLOR-CON) 10 MEQ tablet Take 2 tablets (20 mEq  total) by mouth daily. (Patient taking differently: 10 mEq 2 (two) times daily. Take 2 tablets (20 mEq  total) by  mouth daily.)   Sodium Chloride-Sodium Bicarb (SINUS WASH NETI POT NA) Place 1 application into the nose 2 (two) times daily.   triamterene-hydrochlorothiazide (MAXZIDE-25) 37.5-25 MG tablet TAKE 1 TABLET BY MOUTH  DAILY   No facility-administered encounter medications on file as of 08/04/2021.    Allergies (verified) Cefuroxime axetil, Cefuroxime axetil, Clarithromycin, Levofloxacin, Lipitor [atorvastatin], Losartan, Other, Rosuvastatin, and Valsartan   History: Past Medical History:  Diagnosis Date    Allergic rhinitis    Allergy    Bronchitis    more frequent in the past   Cataract    "suspect per pt"   Colon polyps    history of   GERD (gastroesophageal reflux disease)    Glaucoma    suspected   Heart murmur    Hyperlipidemia    Hypertension    Osteoarthritis of knee    Osteopenia    Past Surgical History:  Procedure Laterality Date   BUNIONECTOMY     bilateral-PIN LEFT GREAT TOE   CARPAL TUNNEL RELEASE     COLONOSCOPY     FUNCTIONAL ENDOSCOPIC SINUS SURGERY     05-04-2011   HYSTERECTOMY     only removed uterus   KNEE ARTHROSCOPY     right   TUBAL LIGATION     bilateral   VEIN SURGERY  2006   legs   Family History  Problem Relation Age of Onset   Cancer Mother        colon, lungs, liver   Colon cancer Mother 18   Cancer Father        prostate   Heart disease Father        A-fib   Stroke Father        TIA's   Alcohol abuse Brother    Cancer Brother        pancreatic, smoker   Diabetes Brother    Diabetes Brother    Stomach cancer Neg Hx    Esophageal cancer Neg Hx    Rectal cancer Neg Hx    Breast cancer Neg Hx    Social History   Socioeconomic History   Marital status: Widowed    Spouse name: Not on file   Number of children: Not on file   Years of education: Not on file   Highest education level: Not on file  Occupational History   Occupation: [employer]    Comment: over 30 years  Tobacco Use   Smoking status: Never   Smokeless tobacco: Never   Tobacco comments:    used to have secondary smoke exposure from husband  Vaping Use   Vaping Use: Never used  Substance and Sexual Activity   Alcohol use: No    Alcohol/week: 0.0 standard drinks   Drug use: No   Sexual activity: Never    Birth control/protection: Post-menopausal  Other Topics Concern   Not on file  Social History Narrative   Retired from [employer] in 2014.    Works for [employer] alone   Works out    Right handed   Caffeine: none   Social Determinants of Adult nurse Strain: Low Risk    Difficulty of Paying Living Expenses: Not hard at all  Food Insecurity: No Food Insecurity   Worried About Charity fundraiser in the Last Year: Never true   Arboriculturist in the Last Year: Never true  Transportation Needs: No Data processing manager (Medical): No  Lack of Transportation (Non-Medical): No  Physical Activity: Sufficiently Active   Days of Exercise per Week: 5 days   Minutes of Exercise per Session: 60 min  Stress: No Stress Concern Present   Feeling of Stress : Not at all  Social Connections: Not on file    Tobacco Counseling Counseling given: Not Answered Tobacco comments: used to have secondary smoke exposure from husband   Clinical Intake:                 Diabetic? No         Activities of Daily Living In your present state of health, do you have any difficulty performing the following activities: 12/09/2020 08/03/2020  Hearing? - N  Vision? - N  Difficulty concentrating or making decisions? - N  Walking or climbing stairs? N N  Dressing or bathing? - N  Doing errands, shopping? N N  Preparing Food and eating ? - N  Using the Toilet? - N  In the past six months, have you accidently leaked urine? - N  Do you have problems with loss of bowel control? - N  Managing your Medications? - N  Managing your Finances? - N  Housekeeping or managing your Housekeeping? - N  Some recent data might be hidden    Patient Care Team: Tower, Wynelle Fanny, MD as PCP - General Linus Mako, MD as Attending Physician (Family Medicine) Elsie Saas, MD as Consulting Physician (Orthopedic Surgery) Dingeldein, Remo Lipps, MD as Consulting Physician (Ophthalmology)  Indicate any recent Medical Services you may have received from other than [facility] providers in the past year (date may be approximate).     Assessment:   This is a routine wellness examination for Amy Herman.  Hearing/Vision screen No  results found.  Dietary issues and exercise activities discussed:     Goals Addressed   None    Depression Screen PHQ 2/9 Scores 08/03/2020 05/09/2019 03/29/2018 03/29/2018 05/08/2016 06/28/2012  PHQ - 2 Score 0 0 0 0 0 0  PHQ- 9 Score 0 - - - - -    Fall Risk Fall Risk  08/03/2020 05/09/2019 03/29/2018 03/29/2018 05/08/2016  Falls in the past year? 0 0 No No No  Number falls in past yr: 0 - - - -  Injury with Fall? 0 - - - -  Risk for fall due to : No Fall Risks - - - -  Follow up Falls evaluation completed;Falls prevention discussed Falls evaluation completed - - -    FALL RISK PREVENTION PERTAINING TO THE HOME:  Any stairs in or around the home? {YES/NO:21197} If so, are there any without handrails? {YES/NO:21197} Home free of loose throw rugs in walkways, pet beds, electrical cords, etc? {YES/NO:21197} Adequate lighting in your home to reduce risk of falls? {YES/NO:21197}  ASSISTIVE DEVICES UTILIZED TO PREVENT FALLS:  Life alert? {YES/NO:21197} Use of a cane, walker or w/c? {YES/NO:21197} Grab bars in the bathroom? {YES/NO:21197} Shower chair or bench in shower? {YES/NO:21197} Elevated toilet seat or a handicapped toilet? {YES/NO:21197}  TIMED UP AND GO:  Was the test performed? No .    Cognitive Function: MMSE - Mini Mental State Exam 08/03/2020 05/08/2016  Not completed: Refused -  Orientation to time - 5  Orientation to Place - 5  Registration - 3  Attention/ Calculation - 0  Recall - 3  Language- name 2 objects - 0  Language- repeat - 1  Language- follow 3 step command - 3  Language- read & follow direction - 0  Write a sentence - 0  Copy design - 0  Total score - 20        Immunizations Immunization History  Administered Date(s) Administered   Fluad Quad(high Dose 65+) 04/10/2019, 04/24/2020   Influenza Split 06/08/2011   Influenza Whole 05/03/2009   Influenza, High Dose Seasonal PF 08/05/2013, 04/19/2021   Influenza,inj,Quad PF,6+ Mos 04/10/2013,  06/03/2014, 07/29/2015, 05/08/2016, 03/29/2018   Influenza-Unspecified 04/16/2017   Moderna Sars-Covid-2 Vaccination 08/16/2019, 09/13/2019, 06/29/2020   PFIZER(Purple Top)SARS-COV-2 Vaccination 12/10/2020   Pneumococcal Conjugate-13 08/12/2015   Pneumococcal Polysaccharide-23 07/03/2004, 01/31/2011, 08/05/2013, 11/18/2015, 11/09/2016, 11/08/2017, 12/11/2019, 02/10/2021   Td 07/23/2006   Tdap 07/24/2017   Zoster, Live 06/13/2007    TDAP status: Up to date  Flu Vaccine status: Up to date  Pneumococcal vaccine status: Up to date  {Covid-19 vaccine status:2101808}  Qualifies for Shingles Vaccine? Yes   Zostavax completed Yes   {Shingrix Completed?:2101804}  Screening Tests Health Maintenance  Topic Date Due   Zoster Vaccines- Shingrix (1 of 2) Never done   COVID-19 Vaccine (5 - Booster for Moderna series) 02/04/2021   COLONOSCOPY (Pts 45-78yrs Insurance coverage will need to be confirmed)  08/01/2022   TETANUS/TDAP  07/25/2027   Pneumonia Vaccine 34+ Years old  Completed   INFLUENZA VACCINE  Completed   DEXA SCAN  Completed   Hepatitis C Screening  Completed   HPV VACCINES  Aged Out    Health Maintenance  Health Maintenance Due  Topic Date Due   Zoster Vaccines- Shingrix (1 of 2) Never done   COVID-19 Vaccine (5 - Booster for Moderna series) 02/04/2021    Colorectal cancer screening: Type of screening: Colonoscopy. Completed 08/01/17. Repeat every 5 years  Mammogram status: Completed 11/02/20. Repeat every year  {Bone Density status:21018021}  Lung Cancer Screening: (Low Dose CT Chest recommended if Age 40-80 years, 30 pack-year currently smoking OR have quit w/in 15years.) does not qualify.    Additional Screening:  Hepatitis C Screening: does qualify; Completed 05/08/16  Vision Screening: Recommended annual ophthalmology exams for early detection of glaucoma and other disorders of the eye. Is the patient up to date with their annual eye exam?  {YES/NO:21197} Who  is the provider or what is the name of the office in which the patient attends annual eye exams? *** If pt is not established with a provider, would they like to be referred to a provider to establish care? {YES/NO:21197}.   Dental Screening: Recommended annual dental exams for proper oral hygiene  Community Resource Referral / Chronic Care Management: CRR required this visit?  {YES/NO:21197}  CCM required this visit?  {YES/NO:21197}     Plan:     I have personally reviewed and noted the following in the patients chart:   Medical and social history Use of alcohol, tobacco or illicit drugs  Current medications and supplements including opioid prescriptions.  Functional ability and status Nutritional status Physical activity Advanced directives List of other physicians Hospitalizations, surgeries, and ER visits in previous 12 months Vitals Screenings to include cognitive, depression, and falls Referrals and appointments  In addition, I have reviewed and discussed with patient certain preventive protocols, quality metrics, and best practice recommendations. A written personalized care plan for preventive services as well as general preventive health recommendations were provided to patient.   Due to this being a telephonic visit, the after visit summary with patients personalized plan was offered to patient via mail or my-chart. ***Patient declined at this time./ Patient would like to access on my-chart/ per  request, patient was mailed a copy of AVS./ Patient preferred to pick up at office at next visit.   Loma Messing, LPN   01/01/8205   Nurse Health Advisor  Nurse Notes: none

## 2021-08-04 ENCOUNTER — Ambulatory Visit: Payer: Medicare Other

## 2021-08-11 DIAGNOSIS — J3089 Other allergic rhinitis: Secondary | ICD-10-CM | POA: Diagnosis not present

## 2021-08-13 ENCOUNTER — Other Ambulatory Visit: Payer: Self-pay | Admitting: Family Medicine

## 2021-08-15 DIAGNOSIS — H401131 Primary open-angle glaucoma, bilateral, mild stage: Secondary | ICD-10-CM | POA: Diagnosis not present

## 2021-08-18 DIAGNOSIS — J3089 Other allergic rhinitis: Secondary | ICD-10-CM | POA: Diagnosis not present

## 2021-08-25 DIAGNOSIS — J3089 Other allergic rhinitis: Secondary | ICD-10-CM | POA: Diagnosis not present

## 2021-08-30 DIAGNOSIS — J3089 Other allergic rhinitis: Secondary | ICD-10-CM | POA: Diagnosis not present

## 2021-09-01 DIAGNOSIS — H02834 Dermatochalasis of left upper eyelid: Secondary | ICD-10-CM | POA: Diagnosis not present

## 2021-09-01 DIAGNOSIS — H02831 Dermatochalasis of right upper eyelid: Secondary | ICD-10-CM | POA: Diagnosis not present

## 2021-09-06 ENCOUNTER — Ambulatory Visit: Payer: Self-pay | Admitting: Family Medicine

## 2021-09-06 DIAGNOSIS — J3089 Other allergic rhinitis: Secondary | ICD-10-CM | POA: Diagnosis not present

## 2021-09-08 ENCOUNTER — Ambulatory Visit: Payer: Self-pay | Admitting: Family Medicine

## 2021-09-13 DIAGNOSIS — J3089 Other allergic rhinitis: Secondary | ICD-10-CM | POA: Diagnosis not present

## 2021-09-14 ENCOUNTER — Ambulatory Visit: Payer: Self-pay | Admitting: Family Medicine

## 2021-09-19 ENCOUNTER — Encounter: Payer: Self-pay | Admitting: Family Medicine

## 2021-09-19 ENCOUNTER — Ambulatory Visit (INDEPENDENT_AMBULATORY_CARE_PROVIDER_SITE_OTHER): Payer: Medicare Other | Admitting: Family Medicine

## 2021-09-19 ENCOUNTER — Other Ambulatory Visit: Payer: Self-pay

## 2021-09-19 ENCOUNTER — Ambulatory Visit: Payer: Self-pay | Admitting: Family Medicine

## 2021-09-19 VITALS — BP 126/64 | HR 88 | Temp 98.6°F | Ht 60.5 in | Wt 139.0 lb

## 2021-09-19 DIAGNOSIS — I10 Essential (primary) hypertension: Secondary | ICD-10-CM

## 2021-09-19 NOTE — Progress Notes (Signed)
? ?Subjective:  ? ? Patient ID: Amy Herman, female    DOB: 02-19-1946, 76 y.o.   MRN: [id number] ? ?This visit occurred during the SARS-CoV-2 public health emergency.  Safety protocols were in place, including screening questions prior to the visit, additional usage of staff PPE, and extensive cleaning of exam room while observing appropriate contact time as indicated for disinfecting solutions.  ? ?HPI ?Pt presents for f/u of HTN ? ?Wt Readings from Last 3 Encounters:  ?09/19/21 139 lb (63 kg)  ?02/15/21 139 lb 6 oz (63.2 kg)  ?12/09/20 143 lb (64.9 kg)  ? ?26.70 kg/m? ? ?Feels good  ?Back in the gym and working out  ?Loves it  ?02 fitness-gym she likes  ? ? ?Last visit noted blood pressure had trended up with reading of 146/92 ?Patient noticed a feeling of head pressure as well ?Made plan to continue triamterene/HCT 37.5/25 mg once daily ?Added amlodipine 5 mg once daily with instructions to call if any side effects and to watch blood pressure at home ?Had to stop it due to low bp  ?Thinks the gym brought it down  ? ?No headaches or flushing or edema  ?Good exercise tolerance-spinning and elliptical and free weights  ?BP Readings from Last 3 Encounters:  ?09/19/21 126/64  ?02/15/21 (!) 146/92  ?12/09/20 (!) 141/79  ?Good control  ? ?Has her knee replacement planned in June  ?Excited to get that done  ? ?Patient Active Problem List  ? Diagnosis Date Noted  ? Allergic rhinitis 09/16/2020  ? Cough 09/16/2020  ? Allergic rhinitis due to pollen 08/15/2020  ? Statin myopathy 08/10/2020  ? Dizziness 07/09/2019  ? Medicare annual wellness visit, subsequent 05/09/2019  ? Medicare annual wellness visit, initial 03/29/2018  ? Left knee pain 12/13/2017  ? Primary osteoarthritis of left knee 12/13/2017  ? History of UTI 11/11/2017  ? Encounter for screening mammogram for breast cancer 05/08/2016  ? Estrogen deficiency 02/16/2016  ? Contact with or exposure to communicable disease 02/16/2016  ? Pre-operative cardiovascular  examination 07/21/2013  ? GERD (gastroesophageal reflux disease) 06/18/2013  ? Varicose veins of lower extremities with other complications 29/52/8413  ? Lumbar disc disease 08/15/2011  ? Joint pain 08/15/2011  ? Osteopenia 01/31/2011  ? Post-menopausal 01/31/2011  ? Hypokalemia 01/31/2011  ? Hyperlipidemia 03/24/2008  ? ESSENTIAL HYPERTENSION, BENIGN 03/24/2008  ? COLONIC POLYPS, HX OF 03/24/2008  ? ?Past Medical History:  ?Diagnosis Date  ? Allergic rhinitis   ? Allergy   ? Bronchitis   ? more frequent in the past  ? Cataract   ? "suspect per pt"  ? Colon polyps   ? history of  ? GERD (gastroesophageal reflux disease)   ? Glaucoma   ? suspected  ? Heart murmur   ? Hyperlipidemia   ? Hypertension   ? Osteoarthritis of knee   ? Osteopenia   ? ?Past Surgical History:  ?Procedure Laterality Date  ? BUNIONECTOMY    ? bilateral-PIN LEFT GREAT TOE  ? CARPAL TUNNEL RELEASE    ? COLONOSCOPY    ? FUNCTIONAL ENDOSCOPIC SINUS SURGERY    ? 05-04-2011  ? HYSTERECTOMY    ? only removed uterus  ? KNEE ARTHROSCOPY    ? right  ? TUBAL LIGATION    ? bilateral  ? VEIN SURGERY  2006  ? legs  ? ?Social History  ? ?Tobacco Use  ? Smoking status: Never  ? Smokeless tobacco: Never  ? Tobacco comments:  ?  used to  have secondary smoke exposure from husband  ?Vaping Use  ? Vaping Use: Never used  ?Substance Use Topics  ? Alcohol use: No  ?  Alcohol/week: 0.0 standard drinks  ? Drug use: No  ? ?Family History  ?Problem Relation Age of Onset  ? Cancer Mother   ?     colon, lungs, liver  ? Colon cancer Mother 19  ? Cancer Father   ?     prostate  ? Heart disease Father   ?     A-fib  ? Stroke Father   ?     TIA's  ? Alcohol abuse Brother   ? Cancer Brother   ?     pancreatic, smoker  ? Diabetes Brother   ? Diabetes Brother   ? Stomach cancer Neg Hx   ? Esophageal cancer Neg Hx   ? Rectal cancer Neg Hx   ? Breast cancer Neg Hx   ? ?Allergies  ?Allergen Reactions  ? Cefuroxime Axetil   ?  REACTION: bumps on tounge  ? Cefuroxime Axetil Other (See  Comments)  ?  Other reaction(s): RASH ?Other reaction(s): RASH ?REACTION: bumps on tounge  ? Clarithromycin Other (See Comments)  ?  Other reaction(s): OTHER ?Other reaction(s): OTHER ?REACTION: GI ?Biaxin ?  ? Levofloxacin Other (See Comments)  ?  Joint pains  ? Lipitor [Atorvastatin] Other (See Comments)  ?  Muscle pain  ?Muscle pain   ? Losartan Other (See Comments)  ?  cough ?cough  ? Other   ?  Other reaction(s): Unknown  ? Rosuvastatin Other (See Comments)  ?  REACTION: severe muscle pain ?  ? Valsartan   ?  Taken off from MD d/t a recall  ? ?Current Outpatient Medications on File Prior to Visit  ?Medication Sig Dispense Refill  ? acetaminophen (TYLENOL) 500 MG tablet Take 500 mg by mouth every 8 (eight) hours as needed for moderate pain.    ? albuterol (VENTOLIN HFA) 108 (90 Base) MCG/ACT inhaler Inhale 1 puff into the lungs every 6 (six) hours as needed for wheezing or shortness of breath.    ? aspirin 81 MG EC tablet Take 81 mg by mouth daily.    ? bimatoprost (LUMIGAN) 0.01 % SOLN Place 1 drop into both eyes at bedtime.    ? calcium citrate-vitamin D (CITRACAL+D) 315-200 MG-UNIT per tablet Take 1 tablet by mouth daily.    ? cetirizine (ZYRTEC) 10 MG tablet Take 10 mg by mouth daily.    ? Cholecalciferol (VITAMIN D3) 125 MCG (5000 UT) CAPS Take 5,000 Units by mouth daily.    ? EPINEPHrine 0.3 mg/0.3 mL IJ SOAJ injection Inject 0.3 mg into the muscle as needed for anaphylaxis.    ? fluticasone (FLONASE) 50 MCG/ACT nasal spray Place 2 sprays into both nostrils 2 (two) times daily.    ? Garlic 1610 MG CAPS Take 1,000 mg by mouth daily.    ? montelukast (SINGULAIR) 10 MG tablet Take 10 mg by mouth at bedtime as needed (allergies).    ? Omega 3 1000 MG CAPS Take 1,000 mg by mouth daily.    ? omeprazole (PRILOSEC) 20 MG capsule Take 20 mg by mouth daily as needed (acid reflux).    ? potassium chloride (KLOR-CON) 10 MEQ tablet Take 2 tablets (20 mEq  total) by mouth daily. (Patient taking differently: 10 mEq 2  (two) times daily. Take 2 tablets (20 mEq  total) by mouth daily.) 180 tablet 3  ? Sodium Chloride-Sodium Bicarb (SINUS  [place] NETI POT NA) Place 1 application into the nose 2 (two) times daily.    ? triamterene-hydrochlorothiazide (MAXZIDE-25) 37.5-25 MG tablet TAKE 1 TABLET BY MOUTH DAILY 90 tablet 1  ? Brimonidine Tartrate (LUMIFY) 0.025 % SOLN Place 1 drop into both eyes every morning. (Patient not taking: Reported on 09/19/2021)    ? ?No current facility-administered medications on file prior to visit.  ?  ?Review of Systems  ?Constitutional:  Negative for activity change, appetite change, fatigue, fever and unexpected weight change.  ?HENT:  Negative for congestion, ear pain, rhinorrhea, sinus pressure and sore throat.   ?Eyes:  Negative for pain, redness and visual disturbance.  ?Respiratory:  Negative for cough, shortness of breath and wheezing.   ?Cardiovascular:  Negative for chest pain and palpitations.  ?Gastrointestinal:  Negative for abdominal pain, blood in stool, constipation and diarrhea.  ?Endocrine: Negative for polydipsia and polyuria.  ?Genitourinary:  Negative for dysuria, frequency and urgency.  ?Musculoskeletal:  Negative for arthralgias, back pain and myalgias.  ?Skin:  Negative for pallor and rash.  ?Allergic/Immunologic: Negative for environmental allergies.  ?Neurological:  Negative for dizziness, syncope and headaches.  ?Hematological:  Negative for adenopathy. Does not bruise/bleed easily.  ?Psychiatric/Behavioral:  Negative for decreased concentration and dysphoric mood. The patient is not nervous/anxious.   ? ?   ?Objective:  ? Physical Exam ?Constitutional:   ?   General: She is not in acute distress. ?   Appearance: Normal appearance. She is well-developed and normal weight. She is not ill-appearing or diaphoretic.  ?HENT:  ?   Head: Normocephalic and atraumatic.  ?Eyes:  ?   Conjunctiva/sclera: Conjunctivae normal.  ?   Pupils: Pupils are equal, round, and reactive to light.  ?Neck:   ?   Thyroid: No thyromegaly.  ?   Vascular: No carotid bruit or JVD.  ?Cardiovascular:  ?   Rate and Rhythm: Normal rate and regular rhythm.  ?   Heart sounds: Normal heart sounds.  ?  No gallop.  ?Pulm

## 2021-09-19 NOTE — Patient Instructions (Signed)
Keep up the great work with healthy diet and exercise  ? ?Take care of yourself  ? ?BP is good at 126/64 ?Continue the triam-hct daily  ?Stay off the amlodipine for now  ? ? ?

## 2021-09-19 NOTE — Assessment & Plan Note (Signed)
bp in fair control at this time  ?BP Readings from Last 1 Encounters:  ?09/19/21 126/64  ? ?No changes needed ?Most recent labs reviewed  ?Disc lifstyle change with low sodium diet and exercise  ?Excellent control with triamterene/HCTZ 37.5/25 mg once daily ?Patient stopped the amlodipine due to low blood pressure and did not need it ?Working out regularly and I believe this is helping as well ?We will continue to work on lifestyle changes ?

## 2021-09-22 DIAGNOSIS — J3089 Other allergic rhinitis: Secondary | ICD-10-CM | POA: Diagnosis not present

## 2021-09-29 DIAGNOSIS — J3089 Other allergic rhinitis: Secondary | ICD-10-CM | POA: Diagnosis not present

## 2021-09-30 DIAGNOSIS — H401131 Primary open-angle glaucoma, bilateral, mild stage: Secondary | ICD-10-CM | POA: Diagnosis not present

## 2021-10-04 DIAGNOSIS — J3089 Other allergic rhinitis: Secondary | ICD-10-CM | POA: Diagnosis not present

## 2021-10-18 DIAGNOSIS — J3089 Other allergic rhinitis: Secondary | ICD-10-CM | POA: Diagnosis not present

## 2021-10-25 DIAGNOSIS — J3089 Other allergic rhinitis: Secondary | ICD-10-CM | POA: Diagnosis not present

## 2021-11-02 ENCOUNTER — Telehealth: Payer: Self-pay | Admitting: Family Medicine

## 2021-11-02 ENCOUNTER — Ambulatory Visit: Payer: Medicare Other

## 2021-11-02 NOTE — Telephone Encounter (Signed)
Left message asking pt to call [phone] to schedule her awv appt  ?

## 2021-11-02 NOTE — Telephone Encounter (Signed)
Patient cancelled appointment, needs to reschedule ?Would be able to do this on 5.4.23 if possible ?

## 2021-11-03 DIAGNOSIS — J3089 Other allergic rhinitis: Secondary | ICD-10-CM | POA: Diagnosis not present

## 2021-11-03 NOTE — Telephone Encounter (Signed)
L/m asking pt to call [phone] to r/s her awv appt ?

## 2021-11-08 ENCOUNTER — Telehealth: Payer: Self-pay | Admitting: Family Medicine

## 2021-11-08 NOTE — Telephone Encounter (Signed)
LVM for pt to rtn my call to r/s appt with NHA. Please schedule this appt if pt calls the office.  ?

## 2021-11-08 NOTE — Telephone Encounter (Signed)
LVM for pt to rtn my call to r/s her 11/02/21 appt with NHA. Please schedule this appt if pt calls the office.  ?

## 2021-11-11 DIAGNOSIS — J3089 Other allergic rhinitis: Secondary | ICD-10-CM | POA: Diagnosis not present

## 2021-11-12 ENCOUNTER — Other Ambulatory Visit: Payer: Self-pay | Admitting: Family Medicine

## 2021-11-14 ENCOUNTER — Other Ambulatory Visit: Payer: Self-pay | Admitting: Family Medicine

## 2021-11-14 DIAGNOSIS — Z1231 Encounter for screening mammogram for malignant neoplasm of breast: Secondary | ICD-10-CM

## 2021-11-16 DIAGNOSIS — J3089 Other allergic rhinitis: Secondary | ICD-10-CM | POA: Diagnosis not present

## 2021-11-18 NOTE — Discharge Instructions (Signed)
Instructions after Total Knee Replacement   Amy Herman, Jr., M.D.     Dept. of Orthopaedics & Sports Medicine  [facility]  [address]  [place], [place]  [address]  Phone: [phone]   Fax: [phone]    DIET: Drink plenty of non-alcoholic fluids. Resume your normal diet. Include foods high in fiber.  ACTIVITY:  You may use crutches or a walker with weight-bearing as tolerated, unless instructed otherwise. You may be weaned off of the walker or crutches by your Physical Therapist.  Do NOT place pillows under the knee. Anything placed under the knee could limit your ability to straighten the knee.   Continue doing gentle exercises. Exercising will reduce the pain and swelling, increase motion, and prevent muscle weakness.   Please continue to use the TED compression stockings for 6 weeks. You may remove the stockings at night, but should reapply them in the morning. Do not drive or operate any equipment until instructed.  WOUND CARE:  Continue to use the PolarCare or ice packs periodically to reduce pain and swelling. You may bathe or shower after the staples are removed at the first office visit following surgery.  MEDICATIONS: You may resume your regular medications. Please take the pain medication as prescribed on the medication. Do not take pain medication on an empty stomach. You have been given a prescription for a blood thinner (Lovenox or Coumadin). Please take the medication as instructed. (NOTE: After completing a 2 week course of Lovenox, take one Enteric-coated aspirin once a day. This along with elevation will help reduce the possibility of phlebitis in your operated leg.) Do not drive or drink alcoholic beverages when taking pain medications.  CALL THE OFFICE FOR: Temperature above 101 degrees Excessive bleeding or drainage on the dressing. Excessive swelling, coldness, or paleness of the toes. Persistent nausea and vomiting.  FOLLOW-UP:  You  should have an appointment to return to the office in 10-14 days after surgery. Arrangements have been made for continuation of Physical Therapy (either home therapy or outpatient therapy).   [facility] Department Directory         [email or web address]       [email or web address]          Cardiology  Appointments: [place] - [phone] [place] - [phone]  Endocrinology  Appointments: [place] - [phone] [place] - [phone]  Gastroenterology  Appointments: [place] - [phone] [place] - [phone]        General Surgery   Appointments: [place] - [phone]  Internal Medicine/Family Medicine  Appointments: [place] - [phone] [place] - [phone] [place] - [phone]  Metabolic and Weigh Loss Surgery  Appointments: [place] - [phone]        Neurology  Appointments: [place] - [phone] [place] - [phone]  Neurosurgery  Appointments: [place] - [phone]  Obstetrics & Gynecology  Appointments: [place] - [phone] [place] - [phone]        Pediatrics  Appointments: [place] - [phone] [place] - [phone]  Physiatry  Appointments: [place] -[phone]  Physical Therapy  Appointments: [place] - [phone] [place] - [phone]        Podiatry  Appointments: [place] - [phone] [place] - [phone]  Pulmonology  Appointments: [place] - [phone]  Rheumatology  Appointments: [place] - [phone]        [place] Location: [facility]  [address] [place], [place]  [address]  [place] Location: [facility] [address] [place], [place]  [address]  [place] Location: [facility] [address] [place], [place]  [address]    

## 2021-11-22 DIAGNOSIS — J3089 Other allergic rhinitis: Secondary | ICD-10-CM | POA: Diagnosis not present

## 2021-11-28 ENCOUNTER — Other Ambulatory Visit: Payer: Medicare Other

## 2021-11-29 ENCOUNTER — Encounter
Admission: RE | Admit: 2021-11-29 | Discharge: 2021-11-29 | Disposition: A | Discharge status: Home / Self Care | Payer: Medicare Other | Source: Ambulatory Visit | Attending: Orthopedic Surgery | Admitting: Orthopedic Surgery

## 2021-11-29 VITALS — BP 157/82 | HR 74 | Resp 18 | Ht 60.5 in | Wt 144.0 lb

## 2021-11-29 DIAGNOSIS — G720 Drug-induced myopathy: Secondary | ICD-10-CM | POA: Diagnosis not present

## 2021-11-29 DIAGNOSIS — Z01818 Encounter for other preprocedural examination: Secondary | ICD-10-CM | POA: Diagnosis not present

## 2021-11-29 DIAGNOSIS — E876 Hypokalemia: Secondary | ICD-10-CM

## 2021-11-29 DIAGNOSIS — T466X5A Adverse effect of antihyperlipidemic and antiarteriosclerotic drugs, initial encounter: Secondary | ICD-10-CM | POA: Insufficient documentation

## 2021-11-29 DIAGNOSIS — M85859 Other specified disorders of bone density and structure, unspecified thigh: Secondary | ICD-10-CM | POA: Diagnosis not present

## 2021-11-29 DIAGNOSIS — E782 Mixed hyperlipidemia: Secondary | ICD-10-CM

## 2021-11-29 DIAGNOSIS — J3089 Other allergic rhinitis: Secondary | ICD-10-CM | POA: Diagnosis not present

## 2021-11-29 DIAGNOSIS — M1711 Unilateral primary osteoarthritis, right knee: Secondary | ICD-10-CM

## 2021-11-29 DIAGNOSIS — Z01812 Encounter for preprocedural laboratory examination: Secondary | ICD-10-CM

## 2021-11-29 DIAGNOSIS — R8290 Unspecified abnormal findings in urine: Secondary | ICD-10-CM

## 2021-11-29 HISTORY — DX: Personal history of urinary calculi: Z87.442

## 2021-11-29 HISTORY — DX: Other specified postprocedural states: Z98.890

## 2021-11-29 HISTORY — DX: Nausea with vomiting, unspecified: R11.2

## 2021-11-29 LAB — URINALYSIS, ROUTINE W REFLEX MICROSCOPIC
Bilirubin Urine: NEGATIVE
Glucose, UA: NEGATIVE mg/dL
Hgb urine dipstick: NEGATIVE
Ketones, ur: NEGATIVE mg/dL
Nitrite: NEGATIVE
Protein, ur: NEGATIVE mg/dL
Specific Gravity, Urine: 1.013 (ref 1.005–1.030)
Squamous Epithelial / HPF: NONE SEEN (ref 0–5)
pH: 5 (ref 5.0–8.0)

## 2021-11-29 LAB — COMPREHENSIVE METABOLIC PANEL
ALT: 19 U/L (ref 0–44)
AST: 24 U/L (ref 15–41)
Albumin: 3.6 g/dL (ref 3.5–5.0)
Alkaline Phosphatase: 93 U/L (ref 38–126)
Anion gap: 11 (ref 5–15)
BUN: 19 mg/dL (ref 8–23)
CO2: 27 mmol/L (ref 22–32)
Calcium: 9.6 mg/dL (ref 8.9–10.3)
Chloride: 101 mmol/L (ref 98–111)
Creatinine, Ser: 0.79 mg/dL (ref 0.44–1.00)
GFR, Estimated: >60 mL/min (ref >60)
Glucose, Bld: 102 mg/dL — ABNORMAL HIGH (ref 70–99)
Potassium: 3.5 mmol/L (ref 3.5–5.1)
Sodium: 139 mmol/L (ref 135–145)
Total Bilirubin: 0.7 mg/dL (ref 0.3–1.2)
Total Protein: 6.9 g/dL (ref 6.5–8.1)

## 2021-11-29 LAB — CBC
HCT: 41.2 % (ref 36.0–46.0)
Hemoglobin: 13.3 g/dL (ref 12.0–15.0)
MCH: 29.6 pg (ref 26.0–34.0)
MCHC: 32.3 g/dL (ref 30.0–36.0)
MCV: 91.8 fL (ref 80.0–100.0)
Platelets: 307 10*3/uL (ref 150–400)
RBC: 4.49 MIL/uL (ref 3.87–5.11)
RDW: 12.2 % (ref 11.5–15.5)
WBC: 5.9 10*3/uL (ref 4.0–10.5)
nRBC: 0 % (ref 0.0–0.2)

## 2021-11-29 LAB — SURGICAL PCR SCREEN
MRSA, PCR: NEGATIVE
Staphylococcus aureus: NEGATIVE

## 2021-11-29 LAB — TYPE AND SCREEN
ABO/RH(D): O POS
Antibody Screen: NEGATIVE

## 2021-11-29 LAB — C-REACTIVE PROTEIN: CRP: 0.7 mg/dL (ref <1.0)

## 2021-11-29 LAB — SEDIMENTATION RATE: Sed Rate: 13 mm/hr (ref 0–30)

## 2021-11-29 NOTE — Patient Instructions (Addendum)
Your procedure is scheduled on: 12/12/21 - Monday Report to the Registration Desk on the 1st floor of the [facility]. To find out your arrival time, please call [phone] between 1PM - 3PM on: 12/09/21 - Friday If your arrival time is 6:00 am, do not arrive prior to that time as the [facility] entrance doors do not open until 6:00 am.  REMEMBER: Instructions that are not followed completely may result in serious medical risk, up to and including death; or upon the discretion of your surgeon and anesthesiologist your surgery may need to be rescheduled.  Do not eat food after midnight the night before surgery.  No gum chewing, lozengers or hard candies.  You may however, drink CLEAR liquids up to 2 hours before you are scheduled to arrive for your surgery. Do not drink anything within 2 hours of your scheduled arrival time.  Clear liquids include: - water  - apple juice without pulp - gatorade (not RED colors) - black coffee or tea (Do NOT add milk or creamers to the coffee or tea) Do NOT drink anything that is not on this list.  In addition, your doctor has ordered for you to drink the provided  Ensure Pre-Surgery Clear Carbohydrate Drink  Drinking this carbohydrate drink up to two hours before surgery helps to reduce insulin resistance and improve patient outcomes. Please complete drinking 2 hours prior to scheduled arrival time.  TAKE THESE MEDICATIONS THE MORNING OF SURGERY WITH A SIP OF WATER:  - fluticasone (FLONASE) 50 MCG/ACT nasal spray -omeprazole (PRILOSEC) 20 MG capsule , (take one the night before and one on the morning of surgery - helps to prevent nausea after surgery.) - Sodium Chloride-Sodium Bicarb (SINUS [place] NETI POT )  Use albuterol (VENTOLIN HFA) 108 (90 Base) MCG/ACT inhaler on the day of surgery and bring to the hospital.  Follow recommendations from Cardiologist, Pulmonologist or PCP regarding stopping Aspirin, Coumadin, Plavix, Eliquis, Pradaxa, or  Pletal. Continue aspirin 81 mg but do not take the day of surgery.  One week prior to surgery: Stop Anti-inflammatories (NSAIDS) such as Advil, Aleve, Ibuprofen, Motrin, Naproxen, Naprosyn and Aspirin based products such as Excedrin, Goodys Powder, BC Powder.  Stop ANY OVER THE COUNTER supplements until after surgery. Stop taking beginning 12/05/21.  You may take Tylenol if needed for pain up until the day of surgery.  No Alcohol for 24 hours before or after surgery.  No Smoking including e-cigarettes for 24 hours prior to surgery.  No chewable tobacco products for at least 6 hours prior to surgery.  No nicotine patches on the day of surgery.  Do not use any "recreational" drugs for at least a week prior to your surgery.  Please be advised that the combination of cocaine and anesthesia may have negative outcomes, up to and including death. If you test positive for cocaine, your surgery will be cancelled.  On the morning of surgery brush your teeth with toothpaste and water, you may rinse your mouth with mouthwash if you wish. Do not swallow any toothpaste or mouthwash.  Use CHG Soap or wipes as directed on instruction sheet.  Do not wear jewelry, make-up, hairpins, clips or nail polish.  Do not wear lotions, powders, or perfumes.   Do not shave body from the neck down 48 hours prior to surgery just in case you cut yourself which could leave a site for infection.  Also, freshly shaved skin may become irritated if using the CHG soap.  Contact lenses, hearing  aids and dentures may not be worn into surgery.  Do not bring valuables to the hospital. [facility] is not responsible for any missing/lost belongings or valuables.   Notify your doctor if there is any change in your medical condition (cold, fever, infection).  Wear comfortable clothing (specific to your surgery type) to the hospital.  After surgery, you can help prevent lung complications by doing breathing exercises.   Take deep breaths and cough every 1-2 hours. Your doctor may order a device called an Incentive Spirometer to help you take deep breaths. When coughing or sneezing, hold a pillow firmly against your incision with both hands. This is called "splinting." Doing this helps protect your incision. It also decreases belly discomfort.  If you are being admitted to the hospital overnight, leave your suitcase in the car. After surgery it may be brought to your room.  If you are being discharged the day of surgery, you will not be allowed to drive home. You will need a responsible adult (18 years or older) to drive you home and stay with you that night.   If you are taking public transportation, you will need to have a responsible adult (18 years or older) with you. Please confirm with your physician that it is acceptable to use public transportation.   Please call the [facility] Dept. at [phone] if you have any questions about these instructions.  Surgery Visitation Policy:  Patients undergoing a surgery or procedure may have two family members or support persons with them as long as the person is not COVID-19 positive or experiencing its symptoms.   Inpatient Visitation:    Visiting hours are 7 a.m. to 8 p.m. Up to four visitors are allowed at one time in a patient room, including children. The visitors may rotate out with other people during the day. One designated support person (adult) may remain overnight.

## 2021-12-01 ENCOUNTER — Inpatient Hospital Stay: Admission: RE | Admit: 2021-12-01 | Payer: Medicare Other | Source: Ambulatory Visit

## 2021-12-01 DIAGNOSIS — H02403 Unspecified ptosis of bilateral eyelids: Secondary | ICD-10-CM | POA: Diagnosis not present

## 2021-12-01 LAB — URINE CULTURE: Culture: 20000 — AB

## 2021-12-02 DIAGNOSIS — M1711 Unilateral primary osteoarthritis, right knee: Secondary | ICD-10-CM | POA: Diagnosis not present

## 2021-12-08 ENCOUNTER — Ambulatory Visit
Admission: RE | Admit: 2021-12-08 | Discharge: 2021-12-08 | Disposition: A | Discharge status: Home / Self Care | Payer: Medicare Other | Source: Ambulatory Visit | Attending: Family Medicine | Admitting: Family Medicine

## 2021-12-08 DIAGNOSIS — J3089 Other allergic rhinitis: Secondary | ICD-10-CM | POA: Diagnosis not present

## 2021-12-08 DIAGNOSIS — Z1231 Encounter for screening mammogram for malignant neoplasm of breast: Secondary | ICD-10-CM | POA: Diagnosis not present

## 2021-12-11 ENCOUNTER — Encounter: Payer: Self-pay | Admitting: Orthopedic Surgery

## 2021-12-11 DIAGNOSIS — J3081 Allergic rhinitis due to animal (cat) (dog) hair and dander: Secondary | ICD-10-CM | POA: Insufficient documentation

## 2021-12-11 NOTE — H&P (Signed)
ORTHOPAEDIC HISTORY & PHYSICAL Amy Herman, [place] - 12/02/2021 8:45 AM EDT Formatting of this note is different from the original. [facility] Chief Complaint:   Chief Complaint  Patient presents with  Knee Pain  H & P RIGHT KNEE   History of Present Illness:   Amy Herman is a 76 y.o. female that presents to clinic today for her preoperative history and evaluation. The patient is scheduled to undergo a right total knee arthroplasty on 12/12/21 by Dr. Marry Guan. Her pain began several years ago. The pain is located along the medial and lateral aspects of the knee. She describes her pain as worse with weightbearing. She reports associated swelling with some giving way of the knee. She denies associated numbness or tingling, denies locking.   The patient's symptoms have progressed to the point that they decrease her quality of life. The patient has previously undergone conservative treatment including Tylenol, CBD oil, and both cortisone and viscosupplementation injections to the knee without adequate control of her symptoms.  Denies history of lumbar surgery, significant cardiac history, or history of DVT. She will be staying with her daughter post-operatively.  Of note, patient does report allergy to cefuroxime.  Past Medical, Surgical, Family, Social History, Allergies, Medications:   Past Medical History:  Past Medical History:  Diagnosis Date  Arthritis  Essential hypertension, benign 03/24/2008  Qualifier: Diagnosis of By: Glori Bickers MD, Carmell [place] Last Assessment & Plan: Formatting of this note might be different from the original. bp in fair control at this time BP Readings from Last 1 Encounters: 02/19/17 116/68 No changes needed Disc lifstyle change with low sodium diet and exercise Will change losartan to telmistartan (she is intol of losartan) Due to recall valsartan Watch bp Wa  Estrogen deficiency 02/16/2016  GERD (gastroesophageal  reflux disease)  Glaucoma  Hyperlipidemia  Hypertension  Hypokalemia 01/31/2011  Last Assessment & Plan: On supplementation Lab today  Lumbar disc disease 08/15/2011  Last Assessment & Plan: Updated per pt This may add to some leg pain Under care of Dr Lynann Bologna and Christella Noa  Osteoarthritis  Osteopenia 01/31/2011  Last Assessment & Plan: Overdue for dexa No fractures On ca and D Great exercise  Personal history of colonic polyps 03/24/2008  Overview: Qualifier: Diagnosis of By: Glori Bickers MD, Carmell [place] Last Assessment & Plan: Per pt due for 8 year colonosc in dec 2013 (she called) No bowel changes Also has fam hx  Post-menopausal 01/31/2011  Thyroid disease  Varicose veins of bilateral lower extremities with other complications 07/21/1476   Past Surgical History:  Past Surgical History:  Procedure Laterality Date  ENDOSCOPIC CARPAL TUNNEL RELEASE  FOOT SURGERY  HYSTERECTOMY  KNEE ARTHROSCOPY Right  Dr Theda Sers, [place]   Current Medications:  Current Outpatient Medications  Medication Sig Dispense Refill  albuterol 90 mcg/actuation inhaler Inhale 1 inhalation into the lungs every 4 (four) hours as needed  amoxicillin (AMOXIL) 500 MG capsule TAKE 4 CAPSULES BY MOUTH ONE HOUR PRIOR TO DENTAL TREATMENT  aspirin 81 MG EC tablet Take 81 mg by mouth once daily  brimonidine (LUMIFY) 0.025 % Drop Apply 1 drop to eye 2 (two) times daily  calcium citrate-vitamin D3 (CITRACAL+D) 315-200 mg-unit tablet Take 1 tablet by mouth once daily  cetirizine (ZYRTEC) 10 MG tablet Take 10 mg by mouth once daily  EPINEPHrine (EPIPEN) 0.3 mg/0.3 mL pen injector Inject 0.3 mg into the muscle once as needed 1  fluticasone propionate (FLONASE)  50 mcg/actuation nasal spray 2 sprays by Each Nare route Two (2) times a day.  garlic 5,409 mg Cap Take 1 tablet by mouth once daily  LUMIGAN 0.01 % ophthalmic solution Place 1 drop into the right eye nightly 0  omega 3-dha-epa-fish oil (FISH OIL)  1,000 mg (120 mg-180 mg) Cap Take 1 capsule by mouth once daily  omeprazole (PRILOSEC) 10 MG DR capsule Take 10 mg by mouth once daily  potassium chloride (KLOR-CON) 10 mEq ER tablet Take 10 mEq by mouth once daily  sod bicarb-sod chlor-neti pot pkdv Place into one nostril  triamterene-hydrochlorothiazide (MAXZIDE-25) 37.5-25 mg tablet TAKE 1 TABLET BY MOUTH DAILY   No current facility-administered medications for this visit.   Allergies:  Allergies  Allergen Reactions  Atorvastatin Muscle Pain  Cefuroxime Axetil Rash and Other (See Comments)  Bumps on tounge  Clarithromycin Rash and Other (See Comments)  Levofloxacin Rash  Losartan Cough  Rosuvastatin Muscle Pain and Other (See Comments)  REACTION: severe muscle pain   Social History:  Social History   Socioeconomic History  Marital status: Widowed  Number of children: 1  Years of education: 12  Highest education level: High school graduate  Occupational History  Occupation: Retired- Some part-time  Tobacco Use  Smoking status: Never  Smokeless tobacco: Never  Substance and Sexual Activity  Alcohol use: No  Drug use: No  Sexual activity: Defer   Family History:  Family History  Problem Relation Age of Onset  High blood pressure (Hypertension) Mother  Stroke Father  Cancer Father   Review of Systems:   A 10+ ROS was performed, reviewed, and the pertinent orthopaedic findings are documented in the HPI.   Physical Examination:   BP (!) 130/90 (BP Location: Left upper arm, Patient Position: Sitting, BP Cuff Size: Adult)  Ht 154.9 cm ('5\' 1"'$ )  Wt 63.5 kg (140 lb)  BMI 26.45 kg/m   Patient is a well-developed, well-nourished female in no acute distress. Patient has normal mood and affect. Patient is alert and oriented to person, place, and time.   HEENT: Atraumatic, normocephalic. Pupils equal and reactive to light. Extraocular motion intact. Noninjected sclera.  Cardiovascular: Regular rate and rhythm, with no  murmurs, rubs, or gallops. Distal pulses palpable.  Respiratory: Lungs clear to auscultation bilaterally.   Right Knee: Soft tissue swelling: mild Effusion: none Erythema: none Crepitance: mild Tenderness: medial, lateral Alignment: relative valgus Mediolateral laxity: lateral pseudolaxity Posterior sag: negative Patellar tracking: Good tracking without evidence of subluxation or tilt Atrophy: No significant atrophy.  Quadriceps tone was fair to good. Range of motion: 0/5/107 degrees   Able to plantarflex dorsiflex the right ankle. Able to flex and extend the toes.  Sensation intact over the saphenous, lateral sural cutaneous, superficial fibular, and deep fibular nerve distributions.  Tests Performed/Reviewed:  X-rays  Anteroposterior, lateral, and sunrise views of the right knee were obtained. Images reveal moderate to severe loss of medial and lateral compartment joint space with significant osteophyte formation noted both medially and laterally. Slight lateral translation of the tibia noted in relation to the distal femur.  I personally ordered and interpreted today's x-rays.  Impression:   ICD-10-CM  1. Primary osteoarthritis of right knee M17.11   Plan:   The patient has end-stage degenerative changes of the right knee. It was explained to the patient that the condition is progressive in nature. Having failed conservative treatment, the patient has elected to proceed with a total joint arthroplasty. The patient will undergo a total joint  arthroplasty with Dr. Marry Guan. The risks of surgery, including blood clot and infection, were discussed with the patient. Measures to reduce these risks, including the use of anticoagulation, perioperative antibiotics, and early ambulation were discussed. The importance of postoperative physical therapy was discussed with the patient. The patient elects to proceed with surgery. The patient is instructed to stop all blood thinners prior to  surgery. The patient is instructed to call the hospital the day before surgery to learn of the proper arrival time.   Contact our office with any questions or concerns. Follow up as indicated, or sooner should any new problems arise, if conditions worsen, or if they are otherwise concerned.   Amy Herman, [facility] [place], [place] [address] Phone: [phone]  This note was generated in part with voice recognition software and I apologize for any typographical errors that were not detected and corrected.  Electronically signed by Amy Fudge, PA at 12/09/2021 11:55 AM EDT

## 2021-12-12 ENCOUNTER — Observation Stay
Admission: RE | Admit: 2021-12-12 | Discharge: 2021-12-13 | Disposition: A | Discharge status: Home Health | Payer: Medicare Other | Attending: Orthopedic Surgery | Admitting: Orthopedic Surgery

## 2021-12-12 ENCOUNTER — Encounter
Admission: RE | Disposition: A | Discharge status: Home Health | Payer: Self-pay | Source: Home / Self Care | Attending: Orthopedic Surgery

## 2021-12-12 ENCOUNTER — Other Ambulatory Visit: Payer: Self-pay

## 2021-12-12 ENCOUNTER — Ambulatory Visit: Payer: Medicare Other | Admitting: Anesthesiology

## 2021-12-12 ENCOUNTER — Encounter: Payer: Self-pay | Admitting: Orthopedic Surgery

## 2021-12-12 ENCOUNTER — Ambulatory Visit: Payer: Medicare Other | Admitting: Urgent Care

## 2021-12-12 ENCOUNTER — Observation Stay: Payer: Medicare Other

## 2021-12-12 DIAGNOSIS — Z7982 Long term (current) use of aspirin: Secondary | ICD-10-CM | POA: Insufficient documentation

## 2021-12-12 DIAGNOSIS — Z96651 Presence of right artificial knee joint: Secondary | ICD-10-CM | POA: Diagnosis not present

## 2021-12-12 DIAGNOSIS — E876 Hypokalemia: Secondary | ICD-10-CM

## 2021-12-12 DIAGNOSIS — I10 Essential (primary) hypertension: Secondary | ICD-10-CM | POA: Diagnosis not present

## 2021-12-12 DIAGNOSIS — M1711 Unilateral primary osteoarthritis, right knee: Principal | ICD-10-CM | POA: Insufficient documentation

## 2021-12-12 DIAGNOSIS — Z96659 Presence of unspecified artificial knee joint: Secondary | ICD-10-CM

## 2021-12-12 DIAGNOSIS — M85859 Other specified disorders of bone density and structure, unspecified thigh: Secondary | ICD-10-CM

## 2021-12-12 DIAGNOSIS — Z79899 Other long term (current) drug therapy: Secondary | ICD-10-CM | POA: Insufficient documentation

## 2021-12-12 DIAGNOSIS — E782 Mixed hyperlipidemia: Secondary | ICD-10-CM

## 2021-12-12 DIAGNOSIS — T466X5A Adverse effect of antihyperlipidemic and antiarteriosclerotic drugs, initial encounter: Secondary | ICD-10-CM

## 2021-12-12 DIAGNOSIS — G720 Drug-induced myopathy: Secondary | ICD-10-CM

## 2021-12-12 HISTORY — PX: KNEE ARTHROPLASTY: SHX992

## 2021-12-12 LAB — ABO/RH: ABO/RH(D): O POS

## 2021-12-12 SURGERY — ARTHROPLASTY, KNEE, TOTAL, USING IMAGELESS COMPUTER-ASSISTED NAVIGATION
Anesthesia: Spinal | Site: Knee | Laterality: Right

## 2021-12-12 MED ORDER — SODIUM CHLORIDE 0.9 % IR SOLN
Status: DC | PRN
  Administered 2021-12-12: 3000 mL

## 2021-12-12 MED ORDER — ONDANSETRON HCL 4 MG/2ML IJ SOLN
INTRAMUSCULAR | Status: AC
Start: 2021-12-12 — End: ?
  Filled 2021-12-12: qty 2

## 2021-12-12 MED ORDER — SURGIPHOR WOUND IRRIGATION SYSTEM - OPTIME
TOPICAL | Status: DC | PRN
  Administered 2021-12-12: 450 mL via TOPICAL

## 2021-12-12 MED ORDER — ACETAMINOPHEN 10 MG/ML IV SOLN: 1000.0000 mg | Freq: Once | INTRAVENOUS | Status: DC | PRN

## 2021-12-12 MED ORDER — BUPIVACAINE HCL (PF) 0.5 % IJ SOLN
INTRAMUSCULAR | Status: DC | PRN
  Administered 2021-12-12: 3 mL

## 2021-12-12 MED ORDER — ALBUTEROL SULFATE (2.5 MG/3ML) 0.083% IN NEBU: 3.0000 mL | INHALATION_SOLUTION | Freq: Four times a day (QID) | RESPIRATORY_TRACT | Status: DC | PRN

## 2021-12-12 MED ORDER — CELECOXIB 200 MG PO CAPS
200.0000 mg | ORAL_CAPSULE | Freq: Two times a day (BID) | ORAL | Status: DC
  Administered 2021-12-12 – 2021-12-13 (×2): 200 mg via ORAL
  Filled 2021-12-12 (×2): qty 1

## 2021-12-12 MED ORDER — FENTANYL CITRATE (PF) 100 MCG/2ML IJ SOLN: 25.0000 ug | INTRAMUSCULAR | Status: DC | PRN

## 2021-12-12 MED ORDER — OMEGA-3-ACID ETHYL ESTERS 1 G PO CAPS
1000.0000 mg | ORAL_CAPSULE | Freq: Every day | ORAL | Status: DC
  Administered 2021-12-13: 1000 mg via ORAL
  Filled 2021-12-12: qty 1

## 2021-12-12 MED ORDER — OXYCODONE HCL 5 MG PO TABS: 5.0000 mg | ORAL_TABLET | ORAL | Status: DC | PRN

## 2021-12-12 MED ORDER — HYDROMORPHONE HCL 1 MG/ML IJ SOLN: 0.5000 mg | INTRAMUSCULAR | Status: DC | PRN

## 2021-12-12 MED ORDER — LORATADINE 10 MG PO TABS
10.0000 mg | ORAL_TABLET | Freq: Every day | ORAL | Status: DC
  Administered 2021-12-13: 10 mg via ORAL
  Filled 2021-12-12: qty 1

## 2021-12-12 MED ORDER — VITAMIN D 25 MCG (1000 UNIT) PO TABS
5000.0000 [IU] | ORAL_TABLET | Freq: Every day | ORAL | Status: DC
  Administered 2021-12-13: 5000 [IU] via ORAL
  Filled 2021-12-12: qty 5

## 2021-12-12 MED ORDER — CELECOXIB 200 MG PO CAPS
400.0000 mg | ORAL_CAPSULE | Freq: Once | ORAL | Status: AC
Start: 2021-12-12 — End: 2021-12-12

## 2021-12-12 MED ORDER — TRANEXAMIC ACID-NACL 1000-0.7 MG/100ML-% IV SOLN
INTRAVENOUS | Status: AC
  Filled 2021-12-12: qty 100

## 2021-12-12 MED ORDER — ONDANSETRON HCL 4 MG/2ML IJ SOLN
INTRAMUSCULAR | Status: DC | PRN
  Administered 2021-12-12: 4 mg via INTRAVENOUS

## 2021-12-12 MED ORDER — SODIUM CHLORIDE (PF) 0.9 % IJ SOLN
INTRAMUSCULAR | Status: DC | PRN
  Administered 2021-12-12: 120 mL via INTRAMUSCULAR

## 2021-12-12 MED ORDER — CHLORHEXIDINE GLUCONATE 4 % EX LIQD: 60.0000 mL | Freq: Once | CUTANEOUS | Status: DC

## 2021-12-12 MED ORDER — PHENYLEPHRINE HCL-NACL 20-0.9 MG/250ML-% IV SOLN
INTRAVENOUS | Status: DC | PRN
  Administered 2021-12-12: 25 ug/min via INTRAVENOUS

## 2021-12-12 MED ORDER — CEFAZOLIN SODIUM-DEXTROSE 2-4 GM/100ML-% IV SOLN
2.0000 g | INTRAVENOUS | Status: AC
  Administered 2021-12-12: 2 g via INTRAVENOUS

## 2021-12-12 MED ORDER — MIDAZOLAM HCL 2 MG/2ML IJ SOLN
INTRAMUSCULAR | Status: AC
  Filled 2021-12-12: qty 2

## 2021-12-12 MED ORDER — BUPIVACAINE-EPINEPHRINE (PF) 0.25% -1:200000 IJ SOLN
INTRAMUSCULAR | Status: AC
  Filled 2021-12-12: qty 60

## 2021-12-12 MED ORDER — TRAMADOL HCL 50 MG PO TABS: 50.0000 mg | ORAL_TABLET | ORAL | Status: DC | PRN

## 2021-12-12 MED ORDER — CEFAZOLIN SODIUM-DEXTROSE 2-4 GM/100ML-% IV SOLN
2.0000 g | Freq: Three times a day (TID) | INTRAVENOUS | Status: AC
  Administered 2021-12-12 – 2021-12-13 (×2): 2 g via INTRAVENOUS
  Filled 2021-12-12 (×2): qty 100

## 2021-12-12 MED ORDER — ALUM & MAG HYDROXIDE-SIMETH 200-200-20 MG/5ML PO SUSP: 30.0000 mL | ORAL | Status: DC | PRN

## 2021-12-12 MED ORDER — LACTATED RINGERS IV SOLN: INTRAVENOUS | Status: DC

## 2021-12-12 MED ORDER — POTASSIUM CHLORIDE CRYS ER 10 MEQ PO TBCR
10.0000 meq | EXTENDED_RELEASE_TABLET | Freq: Two times a day (BID) | ORAL | Status: DC
Start: 2021-12-12 — End: 2021-12-13
  Administered 2021-12-12 – 2021-12-13 (×2): 10 meq via ORAL
  Filled 2021-12-12 (×2): qty 1

## 2021-12-12 MED ORDER — BUPIVACAINE LIPOSOME 1.3 % IJ SUSP
INTRAMUSCULAR | Status: AC
  Filled 2021-12-12: qty 20

## 2021-12-12 MED ORDER — PANTOPRAZOLE SODIUM 40 MG PO TBEC
40.0000 mg | DELAYED_RELEASE_TABLET | Freq: Two times a day (BID) | ORAL | Status: DC
  Administered 2021-12-12 – 2021-12-13 (×2): 40 mg via ORAL
  Filled 2021-12-12 (×2): qty 1

## 2021-12-12 MED ORDER — ACETAMINOPHEN 10 MG/ML IV SOLN
INTRAVENOUS | Status: DC | PRN
  Administered 2021-12-12: 1000 mg via INTRAVENOUS

## 2021-12-12 MED ORDER — ACETAMINOPHEN 10 MG/ML IV SOLN
1000.0000 mg | Freq: Four times a day (QID) | INTRAVENOUS | Status: DC
  Administered 2021-12-12 – 2021-12-13 (×3): 1000 mg via INTRAVENOUS
  Filled 2021-12-12 (×4): qty 100

## 2021-12-12 MED ORDER — OYSTER SHELL CALCIUM/D3 500-5 MG-MCG PO TABS
1.0000 | ORAL_TABLET | Freq: Every day | ORAL | Status: DC
  Administered 2021-12-13: 1 via ORAL
  Filled 2021-12-12: qty 1

## 2021-12-12 MED ORDER — 0.9 % SODIUM CHLORIDE (POUR BTL) OPTIME
TOPICAL | Status: DC | PRN
  Administered 2021-12-12: 1000 mL

## 2021-12-12 MED ORDER — ONDANSETRON HCL 4 MG/2ML IJ SOLN: 4.0000 mg | Freq: Four times a day (QID) | INTRAMUSCULAR | Status: DC | PRN

## 2021-12-12 MED ORDER — PHENOL 1.4 % MT LIQD: 1.0000 | OROMUCOSAL | Status: DC | PRN

## 2021-12-12 MED ORDER — CELECOXIB 200 MG PO CAPS
ORAL_CAPSULE | ORAL | Status: AC
  Administered 2021-12-12: 400 mg via ORAL
  Filled 2021-12-12: qty 2

## 2021-12-12 MED ORDER — TRIAMTERENE-HCTZ 37.5-25 MG PO TABS
1.0000 | ORAL_TABLET | Freq: Every day | ORAL | Status: DC
  Administered 2021-12-13: 1 via ORAL
  Filled 2021-12-12: qty 1

## 2021-12-12 MED ORDER — ONDANSETRON HCL 4 MG PO TABS: 4.0000 mg | ORAL_TABLET | Freq: Four times a day (QID) | ORAL | Status: DC | PRN

## 2021-12-12 MED ORDER — SODIUM CHLORIDE FLUSH 0.9 % IV SOLN
INTRAVENOUS | Status: AC
  Filled 2021-12-12: qty 40

## 2021-12-12 MED ORDER — ONDANSETRON HCL 4 MG/2ML IJ SOLN: 4.0000 mg | Freq: Once | INTRAMUSCULAR | Status: DC | PRN

## 2021-12-12 MED ORDER — ENSURE PRE-SURGERY PO LIQD
296.0000 mL | Freq: Once | ORAL | Status: AC
  Administered 2021-12-12: 296 mL via ORAL

## 2021-12-12 MED ORDER — OXYCODONE HCL 5 MG PO TABS: 5.0000 mg | ORAL_TABLET | Freq: Once | ORAL | Status: DC | PRN

## 2021-12-12 MED ORDER — GLYCOPYRROLATE 0.2 MG/ML IJ SOLN
INTRAMUSCULAR | Status: DC | PRN
  Administered 2021-12-12: .2 mg via INTRAVENOUS

## 2021-12-12 MED ORDER — GABAPENTIN 300 MG PO CAPS
ORAL_CAPSULE | ORAL | Status: AC
  Administered 2021-12-12: 300 mg via ORAL
  Filled 2021-12-12: qty 1

## 2021-12-12 MED ORDER — DIPHENHYDRAMINE HCL 12.5 MG/5ML PO ELIX: 12.5000 mg | ORAL_SOLUTION | ORAL | Status: DC | PRN

## 2021-12-12 MED ORDER — TRANEXAMIC ACID-NACL 1000-0.7 MG/100ML-% IV SOLN
1000.0000 mg | Freq: Once | INTRAVENOUS | Status: AC
  Administered 2021-12-12: 1000 mg via INTRAVENOUS

## 2021-12-12 MED ORDER — BISACODYL 10 MG RE SUPP: 10.0000 mg | Freq: Every day | RECTAL | Status: DC | PRN

## 2021-12-12 MED ORDER — TRANEXAMIC ACID-NACL 1000-0.7 MG/100ML-% IV SOLN
1000.0000 mg | INTRAVENOUS | Status: AC
  Administered 2021-12-12: 1000 mg via INTRAVENOUS

## 2021-12-12 MED ORDER — ORAL CARE MOUTH RINSE: 15.0000 mL | Freq: Once | OROMUCOSAL | Status: AC

## 2021-12-12 MED ORDER — FERROUS SULFATE 325 (65 FE) MG PO TABS
325.0000 mg | ORAL_TABLET | Freq: Two times a day (BID) | ORAL | Status: DC
  Administered 2021-12-13: 325 mg via ORAL
  Filled 2021-12-12: qty 1

## 2021-12-12 MED ORDER — CHLORHEXIDINE GLUCONATE 0.12 % MT SOLN: 15.0000 mL | Freq: Once | OROMUCOSAL | Status: AC

## 2021-12-12 MED ORDER — BUPIVACAINE HCL (PF) 0.25 % IJ SOLN
INTRAMUSCULAR | Status: AC
  Filled 2021-12-12: qty 60

## 2021-12-12 MED ORDER — SODIUM CHLORIDE 0.9 % IV SOLN: INTRAVENOUS | Status: DC

## 2021-12-12 MED ORDER — PROPOFOL 500 MG/50ML IV EMUL
INTRAVENOUS | Status: DC | PRN
  Administered 2021-12-12: 75 ug/kg/min via INTRAVENOUS

## 2021-12-12 MED ORDER — MENTHOL 3 MG MT LOZG: 1.0000 | LOZENGE | OROMUCOSAL | Status: DC | PRN

## 2021-12-12 MED ORDER — GABAPENTIN 300 MG PO CAPS: 300.0000 mg | ORAL_CAPSULE | Freq: Once | ORAL | Status: AC

## 2021-12-12 MED ORDER — ACETAMINOPHEN 10 MG/ML IV SOLN
INTRAVENOUS | Status: AC
  Filled 2021-12-12: qty 100

## 2021-12-12 MED ORDER — FLUTICASONE PROPIONATE 50 MCG/ACT NA SUSP
2.0000 | NASAL | Status: DC | PRN
Start: 2021-12-12 — End: 2021-12-13
  Administered 2021-12-13: 2 via NASAL
  Filled 2021-12-12: qty 16

## 2021-12-12 MED ORDER — DEXAMETHASONE SODIUM PHOSPHATE 10 MG/ML IJ SOLN
INTRAMUSCULAR | Status: AC
  Administered 2021-12-12: 8 mg via INTRAVENOUS
  Filled 2021-12-12: qty 1

## 2021-12-12 MED ORDER — METOCLOPRAMIDE HCL 10 MG PO TABS
10.0000 mg | ORAL_TABLET | Freq: Three times a day (TID) | ORAL | Status: DC
  Administered 2021-12-12 – 2021-12-13 (×3): 10 mg via ORAL
  Filled 2021-12-12 (×6): qty 1

## 2021-12-12 MED ORDER — FLEET ENEMA 7-19 GM/118ML RE ENEM: 1.0000 | ENEMA | Freq: Once | RECTAL | Status: DC | PRN

## 2021-12-12 MED ORDER — CEFAZOLIN SODIUM-DEXTROSE 2-4 GM/100ML-% IV SOLN
INTRAVENOUS | Status: AC
  Filled 2021-12-12: qty 100

## 2021-12-12 MED ORDER — LATANOPROST 0.005 % OP SOLN
1.0000 [drp] | Freq: Every day | OPHTHALMIC | Status: DC
  Administered 2021-12-12: 1 [drp] via OPHTHALMIC
  Filled 2021-12-12: qty 2.5

## 2021-12-12 MED ORDER — SENNOSIDES-DOCUSATE SODIUM 8.6-50 MG PO TABS
1.0000 | ORAL_TABLET | Freq: Two times a day (BID) | ORAL | Status: DC
  Administered 2021-12-12 – 2021-12-13 (×2): 1 via ORAL
  Filled 2021-12-12 (×2): qty 1

## 2021-12-12 MED ORDER — OXYCODONE HCL 5 MG/5ML PO SOLN: 5.0000 mg | Freq: Once | ORAL | Status: DC | PRN

## 2021-12-12 MED ORDER — MAGNESIUM HYDROXIDE 400 MG/5ML PO SUSP
30.0000 mL | Freq: Every day | ORAL | Status: DC
  Administered 2021-12-12: 30 mL via ORAL
  Filled 2021-12-12 (×2): qty 30

## 2021-12-12 MED ORDER — CHLORHEXIDINE GLUCONATE 0.12 % MT SOLN
OROMUCOSAL | Status: AC
  Administered 2021-12-12: 15 mL via OROMUCOSAL
  Filled 2021-12-12: qty 15

## 2021-12-12 MED ORDER — ACETAMINOPHEN 325 MG PO TABS
325.0000 mg | ORAL_TABLET | Freq: Four times a day (QID) | ORAL | Status: DC | PRN
  Filled 2021-12-12: qty 2

## 2021-12-12 MED ORDER — MIDAZOLAM HCL 5 MG/5ML IJ SOLN
INTRAMUSCULAR | Status: DC | PRN
  Administered 2021-12-12 (×2): 1 mg via INTRAVENOUS

## 2021-12-12 MED ORDER — OXYCODONE HCL 5 MG PO TABS: 10.0000 mg | ORAL_TABLET | ORAL | Status: DC | PRN

## 2021-12-12 MED ORDER — ENOXAPARIN SODIUM 30 MG/0.3ML IJ SOSY
30.0000 mg | PREFILLED_SYRINGE | Freq: Two times a day (BID) | INTRAMUSCULAR | Status: DC
  Administered 2021-12-13: 30 mg via SUBCUTANEOUS
  Filled 2021-12-12: qty 0.3

## 2021-12-12 MED ORDER — MONTELUKAST SODIUM 10 MG PO TABS: 10.0000 mg | ORAL_TABLET | Freq: Every evening | ORAL | Status: DC | PRN

## 2021-12-12 MED ORDER — DEXAMETHASONE SODIUM PHOSPHATE 10 MG/ML IJ SOLN: 8.0000 mg | Freq: Once | INTRAMUSCULAR | Status: AC

## 2021-12-12 MED ORDER — GLYCOPYRROLATE 0.2 MG/ML IJ SOLN
INTRAMUSCULAR | Status: AC
  Filled 2021-12-12: qty 1

## 2021-12-12 SURGICAL SUPPLY — 72 items
ATTUNE MED DOME PAT 38 KNEE (Knees) ×1 IMPLANT
ATTUNE PSFEM RTSZ5 NARCEM KNEE (Femur) ×1 IMPLANT
BASEPLATE TIBIAL ROTATING SZ 4 (Knees) ×1 IMPLANT
BATTERY INSTRU NAVIGATION (MISCELLANEOUS) ×12 IMPLANT
BLADE SAW 70X12.5 (BLADE) ×3 IMPLANT
BLADE SAW 90X13X1.19 OSCILLAT (BLADE) ×3 IMPLANT
BLADE SAW 90X25X1.19 OSCILLAT (BLADE) ×3 IMPLANT
BSPLAT TIB 4 CMNT ROT PLAT STR (Knees) ×1 IMPLANT
BTRY SRG DRVR LF (MISCELLANEOUS) ×4
CEMENT HV SMART SET (Cement) ×2 IMPLANT
COOLER POLAR GLACIER W/PUMP (MISCELLANEOUS) ×3 IMPLANT
CUFF TOURN SGL QUICK 24 (TOURNIQUET CUFF)
CUFF TOURN SGL QUICK 34 (TOURNIQUET CUFF)
CUFF TRNQT CYL 24X4X16.5-23 (TOURNIQUET CUFF) IMPLANT
CUFF TRNQT CYL 34X4.125X (TOURNIQUET CUFF) IMPLANT
DRAPE 3/4 80X56 (DRAPES) ×3 IMPLANT
DRAPE INCISE IOBAN 66X45 STRL (DRAPES) IMPLANT
DRSG DERMACEA NONADH 3X8 (GAUZE/BANDAGES/DRESSINGS) ×3 IMPLANT
DRSG MEPILEX SACRM 8.7X9.8 (GAUZE/BANDAGES/DRESSINGS) ×3 IMPLANT
DRSG OPSITE POSTOP 4X14 (GAUZE/BANDAGES/DRESSINGS) ×3 IMPLANT
DRSG TEGADERM 4X4.75 (GAUZE/BANDAGES/DRESSINGS) ×3 IMPLANT
DURAPREP 26ML APPLICATOR (WOUND CARE) ×6 IMPLANT
ELECT CAUTERY BLADE 6.4 (BLADE) ×3 IMPLANT
ELECT REM PT RETURN 9FT ADLT (ELECTROSURGICAL) ×2
ELECTRODE REM PT RTRN 9FT ADLT (ELECTROSURGICAL) ×2 IMPLANT
EX-PIN ORTHOLOCK NAV 4X150 (PIN) ×6 IMPLANT
GLOVE BIOGEL M STRL SZ7.5 (GLOVE) ×12 IMPLANT
GLOVE BIOGEL PI IND STRL 8 (GLOVE) ×2 IMPLANT
GLOVE BIOGEL PI INDICATOR 8 (GLOVE) ×1
GLOVE SURG UNDER POLY LF SZ7.5 (GLOVE) ×3 IMPLANT
GOWN STRL REUS W/ TWL LRG LVL3 (GOWN DISPOSABLE) ×4 IMPLANT
GOWN STRL REUS W/ TWL XL LVL3 (GOWN DISPOSABLE) ×2 IMPLANT
GOWN STRL REUS W/TWL LRG LVL3 (GOWN DISPOSABLE) ×4
GOWN STRL REUS W/TWL XL LVL3 (GOWN DISPOSABLE) ×2
HEMOVAC 400CC 10FR (MISCELLANEOUS) ×3 IMPLANT
HOLDER FOLEY CATH W/STRAP (MISCELLANEOUS) ×3 IMPLANT
HOLSTER ELECTROSUGICAL PENCIL (MISCELLANEOUS) ×3 IMPLANT
HOOD PEEL AWAY FLYTE STAYCOOL (MISCELLANEOUS) ×6 IMPLANT
INSERT TIBIAL KNEE SZ 5 12MM (Insert) IMPLANT
IV NS IRRIG 3000ML ARTHROMATIC (IV SOLUTION) ×3 IMPLANT
KIT TURNOVER KIT A (KITS) ×3 IMPLANT
KNIFE SCULPS 14X20 (INSTRUMENTS) ×3 IMPLANT
MANIFOLD NEPTUNE II (INSTRUMENTS) ×6 IMPLANT
NDL SPNL 20GX3.5 QUINCKE YW (NEEDLE) ×4 IMPLANT
NEEDLE SPNL 20GX3.5 QUINCKE YW (NEEDLE) ×4 IMPLANT
NS IRRIG 500ML POUR BTL (IV SOLUTION) ×3 IMPLANT
PACK TOTAL KNEE (MISCELLANEOUS) ×3 IMPLANT
PAD ABD DERMACEA PRESS 5X9 (GAUZE/BANDAGES/DRESSINGS) ×6 IMPLANT
PAD WRAPON POLAR KNEE (MISCELLANEOUS) ×2 IMPLANT
PIN DRILL FIX HALF THREAD (BIT) ×6 IMPLANT
PIN FIXATION 1/8DIA X 3INL (PIN) ×3 IMPLANT
PULSAVAC PLUS IRRIG FAN TIP (DISPOSABLE) ×2
SOL PREP PVP 2OZ (MISCELLANEOUS) ×2
SOLUTION IRRIG SURGIPHOR (IV SOLUTION) ×3 IMPLANT
SOLUTION PREP PVP 2OZ (MISCELLANEOUS) ×2 IMPLANT
SPONGE DRAIN TRACH 4X4 STRL 2S (GAUZE/BANDAGES/DRESSINGS) ×3 IMPLANT
STAPLER SKIN PROX 35W (STAPLE) ×3 IMPLANT
STOCKINETTE IMPERV 14X48 (MISCELLANEOUS) IMPLANT
STRAP TIBIA SHORT (MISCELLANEOUS) ×3 IMPLANT
SUCTION FRAZIER HANDLE 10FR (MISCELLANEOUS) ×2
SUCTION TUBE FRAZIER 10FR DISP (MISCELLANEOUS) ×2 IMPLANT
SUT VIC AB 0 CT1 36 (SUTURE) ×6 IMPLANT
SUT VIC AB 1 CT1 36 (SUTURE) ×6 IMPLANT
SUT VIC AB 2-0 CT2 27 (SUTURE) ×3 IMPLANT
SYR 30ML LL (SYRINGE) ×6 IMPLANT
TIBIAL INSERT KNEE SZ 5 12MM (Insert) ×2 IMPLANT
TIP FAN IRRIG PULSAVAC PLUS (DISPOSABLE) ×2 IMPLANT
TOWEL OR 17X26 4PK STRL BLUE (TOWEL DISPOSABLE) IMPLANT
TOWER CARTRIDGE SMART MIX (DISPOSABLE) ×3 IMPLANT
TRAY FOLEY MTR SLVR 16FR STAT (SET/KITS/TRAYS/PACK) ×3 IMPLANT
WATER STERILE IRR 500ML POUR (IV SOLUTION) ×3 IMPLANT
WRAPON POLAR PAD KNEE (MISCELLANEOUS) ×2

## 2021-12-12 NOTE — H&P (Signed)
The patient has been re-examined, and the chart reviewed, and there have been no interval changes to the documented history and physical.    The risks, benefits, and alternatives have been discussed at length. The patient expressed understanding of the risks benefits and agreed with plans for surgical intervention.  Sunny Aguon P. Lekeith Wulf, Jr. M.D.    

## 2021-12-12 NOTE — Anesthesia Procedure Notes (Signed)
Procedure Name: MAC Date/Time: 12/12/2021 12:50 PM  Performed by: Jerrye Noble, CRNAPre-anesthesia Checklist: Patient identified, Emergency Drugs available, Suction available and Patient being monitored Patient Re-evaluated:Patient Re-evaluated prior to induction Oxygen Delivery Method: Simple face mask

## 2021-12-12 NOTE — Anesthesia Preprocedure Evaluation (Signed)
Anesthesia Evaluation  Patient identified by MRN, date of birth, ID band Patient awake    Reviewed: Allergy & Precautions, NPO status , Patient's Chart, lab work & pertinent test results  History of Anesthesia Complications (+) PONV and history of anesthetic complications  Airway Mallampati: II  TM Distance: >3 FB Neck ROM: Full    Dental  (+) Partial Upper   Pulmonary neg pulmonary ROS, neg sleep apnea, neg COPD, Patient abstained from smoking.Not current smoker,    Pulmonary exam normal breath sounds clear to auscultation       Cardiovascular Exercise Tolerance: Good METShypertension, Pt. on medications (-) CAD and (-) Past MI (-) dysrhythmias  Rhythm:Regular Rate:Normal - Systolic murmurs    Neuro/Psych negative neurological ROS  negative psych ROS   GI/Hepatic GERD  Medicated and Controlled,(+)     (-) substance abuse  ,   Endo/Other  neg diabetes  Renal/GU negative Renal ROS     Musculoskeletal  (+) Arthritis ,   Abdominal   Peds  Hematology   Anesthesia Other Findings Past Medical History: No date: Allergic rhinitis No date: Allergy No date: Bronchitis     Comment:  more frequent in the past No date: Cataract     Comment:  "suspect per pt" No date: Colon polyps     Comment:  history of No date: GERD (gastroesophageal reflux disease) No date: Glaucoma     Comment:  suspected No date: Heart murmur No date: History of kidney stones     Comment:  suspects that she had one and past it No date: Hyperlipidemia No date: Hypertension No date: Osteoarthritis of knee No date: Osteopenia No date: PONV (postoperative nausea and vomiting)     Comment:  with tubal ligation years ago  Reproductive/Obstetrics                            Anesthesia Physical Anesthesia Plan  ASA: 2  Anesthesia Plan: Spinal   Post-op Pain Management: Ofirmev IV (intra-op)*, Gabapentin PO (pre-op)*  and Celebrex PO (pre-op)*   Induction: Intravenous  PONV Risk Score and Plan: 3 and Ondansetron, Dexamethasone, Propofol infusion, TIVA and Treatment may vary due to age or medical condition  Airway Management Planned: Natural Airway  Additional Equipment: None  Intra-op Plan:   Post-operative Plan:   Informed Consent: I have reviewed the patients History and Physical, chart, labs and discussed the procedure including the risks, benefits and alternatives for the proposed anesthesia with the patient or authorized representative who has indicated his/her understanding and acceptance.       Plan Discussed with: CRNA and Surgeon  Anesthesia Plan Comments: (Discussed R/B/A of neuraxial anesthesia technique with patient: - rare risks of spinal/epidural hematoma, nerve damage, infection - Risk of PDPH - Risk of nausea and vomiting - Risk of conversion to general anesthesia and its associated risks, including sore throat, damage to lips/eyes/teeth/oropharynx, and rare risks such as cardiac and respiratory events. - Risk of allergic reactions  Discussed the role of CRNA in patient's perioperative care.  Patient voiced understanding.)        Anesthesia Quick Evaluation

## 2021-12-12 NOTE — Anesthesia Postprocedure Evaluation (Signed)
Anesthesia Post Note  Patient: Amy Herman  Procedure(s) Performed: COMPUTER ASSISTED TOTAL KNEE ARTHROPLASTY (Right: Knee)  Patient location during evaluation: PACU Anesthesia Type: Spinal Level of consciousness: awake and alert, oriented and patient cooperative Pain management: pain level controlled Vital Signs Assessment: post-procedure vital signs reviewed and stable Respiratory status: spontaneous breathing, nonlabored ventilation and respiratory function stable Cardiovascular status: blood pressure returned to baseline and stable Postop Assessment: adequate PO intake, no headache and spinal receding Anesthetic complications: no   No notable events documented.   Last Vitals:  Vitals:   12/12/21 1730 12/12/21 1745  BP: 113/60 122/60  Pulse: 83 70  Resp: 17 17  Temp:    SpO2: 99% 97%    Last Pain:  Vitals:   12/12/21 1730  TempSrc:   PainSc: 0-No pain                 Darrin Nipper

## 2021-12-12 NOTE — Transfer of Care (Signed)
Immediate Anesthesia Transfer of Care Note  Patient: Amy Herman  Procedure(s) Performed: COMPUTER ASSISTED TOTAL KNEE ARTHROPLASTY (Right: Knee)  Patient Location: PACU  Anesthesia Type:General  Level of Consciousness: awake, drowsy and patient cooperative  Airway & Oxygen Therapy: Patient Spontanous Breathing  Post-op Assessment: Report given to RN and Post -op Vital signs reviewed and stable  Post vital signs: Reviewed and stable  Last Vitals:  Vitals Value Taken Time  BP 133/73 12/12/21 1622  Temp    Pulse 98 12/12/21 1626  Resp 17 12/12/21 1626  SpO2 96 % 12/12/21 1626  Vitals shown include unvalidated device data.  Last Pain:  Vitals:   12/12/21 0944  TempSrc: Temporal  PainSc: 0-No pain         Complications: No notable events documented.

## 2021-12-12 NOTE — Anesthesia Procedure Notes (Signed)
Spinal  Patient location during procedure: OR Start time: 12/12/2021 12:40 PM End time: 12/12/2021 12:44 PM Reason for block: surgical anesthesia Staffing Performed: resident/CRNA  Anesthesiologist: Arita Miss, MD Resident/CRNA: Jerrye Noble, CRNA Performed by: Jerrye Noble, CRNA Authorized by: Arita Miss, MD   Preanesthetic Checklist Completed: patient identified, IV checked, site marked, risks and benefits discussed, surgical consent, monitors and equipment checked, pre-op evaluation and timeout performed Spinal Block Patient position: sitting Prep: ChloraPrep Patient monitoring: heart rate, continuous pulse ox, blood pressure and cardiac monitor Approach: midline Location: L3-4 Injection technique: single-shot Needle Needle type: Introducer and Pencan  Needle gauge: 24 G Needle length: 9 cm Assessment Sensory level: T10 Events: CSF return Additional Notes Sterile aseptic technique used throughout the procedure.  Negative paresthesia. Negative blood return. Positive free-flowing CSF. Expiration date of kit checked and confirmed. Patient tolerated procedure well, without complications.

## 2021-12-12 NOTE — Op Note (Signed)
OPERATIVE NOTE  DATE OF SURGERY:  12/12/2021  PATIENT NAME:  Amy Herman   DOB: 23-Aug-1945  MRN: [id number]  PRE-OPERATIVE DIAGNOSIS: Degenerative arthrosis of the right knee, primary  POST-OPERATIVE DIAGNOSIS:  Same  PROCEDURE:  Right total knee arthroplasty using computer-assisted navigation  SURGEON:  Marciano Sequin. M.D.  ASSISTANT: Cassell Smiles, PA-C (present and scrubbed throughout the case, critical for assistance with exposure, retraction, instrumentation, and closure)  ANESTHESIA: spinal  ESTIMATED BLOOD LOSS: 50 mL  FLUIDS REPLACED: 1200 mL of crystalloid  TOURNIQUET TIME: 100 minutes  DRAINS: 2 medium Hemovac drains  SOFT TISSUE RELEASES: Anterior cruciate ligament, posterior cruciate ligament, deep medial collateral ligament, patellofemoral ligament  IMPLANTS UTILIZED: DePuy Attune size 5N posterior stabilized femoral component (cemented), size 4 rotating platform tibial component (cemented), 38 mm medialized dome patella (cemented), and a 12 mm stabilized rotating platform polyethylene insert.  INDICATIONS FOR SURGERY: Amy Herman is a 76 y.o. year old female with a long history of progressive knee pain. X-rays demonstrated severe degenerative changes in tricompartmental fashion. The patient had not seen any significant improvement despite conservative nonsurgical intervention. After discussion of the risks and benefits of surgical intervention, the patient expressed understanding of the risks benefits and agree with plans for total knee arthroplasty.   The risks, benefits, and alternatives were discussed at length including but not limited to the risks of infection, bleeding, nerve injury, stiffness, blood clots, the need for revision surgery, cardiopulmonary complications, among others, and they were willing to proceed.  PROCEDURE IN DETAIL: The patient was brought into the operating room and, after adequate spinal anesthesia was achieved, a tourniquet was  placed on the patient's upper thigh. The patient's knee and leg were cleaned and prepped with alcohol and DuraPrep and draped in the usual sterile fashion. A "timeout" was performed as per usual protocol. The lower extremity was exsanguinated using an Esmarch, and the tourniquet was inflated to 300 mmHg. An anterior longitudinal incision was made followed by a standard mid vastus approach. The deep fibers of the medial collateral ligament were elevated in a subperiosteal fashion off of the medial flare of the tibia so as to maintain a continuous soft tissue sleeve. The patella was subluxed laterally and the patellofemoral ligament was incised. Inspection of the knee demonstrated severe degenerative changes with full-thickness loss of articular cartilage. Osteophytes were debrided using a rongeur. Anterior and posterior cruciate ligaments were excised. Two 4.0 mm Schanz pins were inserted in the femur and into the tibia for attachment of the array of trackers used for computer-assisted navigation. Hip center was identified using a circumduction technique. Distal landmarks were mapped using the computer. The distal femur and proximal tibia were mapped using the computer. The distal femoral cutting guide was positioned using computer-assisted navigation so as to achieve a 5 distal valgus cut. The femur was sized and it was felt that a size 5N femoral component was appropriate. A size 5 femoral cutting guide was positioned and the anterior cut was performed and verified using the computer. This was followed by completion of the posterior and chamfer cuts. Femoral cutting guide for the central box was then positioned in the center box cut was performed.  Attention was then directed to the proximal tibia. Medial and lateral menisci were excised. The extramedullary tibial cutting guide was positioned using computer-assisted navigation so as to achieve a 0 varus-valgus alignment and 3 posterior slope. The cut was  performed and verified using the computer. The proximal tibia was  sized and it was felt that a size 4 tibial tray was appropriate. Tibial and femoral trials were inserted followed by insertion of a 12 mm polyethylene insert. This allowed for excellent mediolateral soft tissue balancing both in flexion and in full extension. Finally, the patella was cut and prepared so as to accommodate a 38 mm medialized dome patella. A patella trial was placed and the knee was placed through a range of motion with excellent patellar tracking appreciated. The femoral trial was removed after debridement of posterior osteophytes. The central post-hole for the tibial component was reamed followed by insertion of a keel punch. Tibial trials were then removed. Cut surfaces of bone were irrigated with copious amounts of normal saline using pulsatile lavage and then suctioned dry. Polymethylmethacrylate cement was prepared in the usual fashion using a vacuum mixer. Cement was applied to the cut surface of the proximal tibia as well as along the undersurface of a size 4 rotating platform tibial component. Tibial component was positioned and impacted into place. Excess cement was removed using [employer] fast streamer. Cement was then applied to the cut surfaces of the femur as well as along the posterior flanges of the size 5N femoral component. The femoral component was positioned and impacted into place. Excess cement was removed using [employer] fast streamer. A 12 mm polyethylene trial was inserted and the knee was brought into full extension with steady axial compression applied. Finally, cement was applied to the backside of a 38 mm medialized dome patella and the patellar component was positioned and patellar clamp applied. Excess cement was removed using [employer] fast streamer. After adequate curing of the cement, the tourniquet was deflated after a total tourniquet time of 100 minutes. Hemostasis was achieved using electrocautery. The knee was irrigated  with copious amounts of normal saline using pulsatile lavage followed by 450 ml of Surgiphor and then suctioned dry. 20 mL of 1.3% Exparel and 60 mL of 0.25% Marcaine in 40 mL of normal saline was injected along the posterior capsule, medial and lateral gutters, and along the arthrotomy site. A 12 mm stabilized rotating platform polyethylene insert was inserted and the knee was placed through a range of motion with excellent mediolateral soft tissue balancing appreciated and excellent patellar tracking noted. 2 medium drains were placed in the wound bed and brought out through separate stab incisions. The medial parapatellar portion of the incision was reapproximated using interrupted sutures of #1 Vicryl. Subcutaneous tissue was approximated in layers using first #0 Vicryl followed #2-0 Vicryl. The skin was approximated with skin staples. A sterile dressing was applied.  The patient tolerated the procedure well and was transported to the recovery room in stable condition.    Amy Herman., M.D.

## 2021-12-13 ENCOUNTER — Encounter: Payer: Self-pay | Admitting: Orthopedic Surgery

## 2021-12-13 DIAGNOSIS — I10 Essential (primary) hypertension: Secondary | ICD-10-CM | POA: Diagnosis not present

## 2021-12-13 DIAGNOSIS — Z7982 Long term (current) use of aspirin: Secondary | ICD-10-CM | POA: Diagnosis not present

## 2021-12-13 DIAGNOSIS — Z96659 Presence of unspecified artificial knee joint: Secondary | ICD-10-CM | POA: Diagnosis not present

## 2021-12-13 DIAGNOSIS — M1711 Unilateral primary osteoarthritis, right knee: Secondary | ICD-10-CM | POA: Diagnosis not present

## 2021-12-13 DIAGNOSIS — Z79899 Other long term (current) drug therapy: Secondary | ICD-10-CM | POA: Diagnosis not present

## 2021-12-13 MED ORDER — OXYCODONE HCL 5 MG PO TABS: 5.0000 mg | ORAL_TABLET | ORAL | 0 refills | Status: DC | PRN

## 2021-12-13 MED ORDER — CELECOXIB 200 MG PO CAPS: 200.0000 mg | ORAL_CAPSULE | Freq: Two times a day (BID) | ORAL | 0 refills | Status: DC

## 2021-12-13 MED ORDER — TRAMADOL HCL 50 MG PO TABS: 50.0000 mg | ORAL_TABLET | ORAL | 0 refills | Status: DC | PRN

## 2021-12-13 MED ORDER — ENOXAPARIN SODIUM 40 MG/0.4ML IJ SOSY: 40.0000 mg | PREFILLED_SYRINGE | INTRAMUSCULAR | 0 refills | Status: DC

## 2021-12-13 NOTE — Discharge Summary (Signed)
Physician Discharge Summary  Patient ID: Amy Herman MRN: [id number] DOB/AGE: Jan 17, 1946 76 y.o.  Admit date: 12/12/2021 Discharge date: 12/13/2021  Admission Diagnoses:  Total knee replacement status [Z96.659]  Surgeries:Procedure(s):  Right total knee arthroplasty using computer-assisted navigation   SURGEON:  Marciano Sequin. M.D.   ASSISTANT: Cassell Smiles, PA-C (present and scrubbed throughout the case, critical for assistance with exposure, retraction, instrumentation, and closure)   ANESTHESIA: spinal   ESTIMATED BLOOD LOSS: 50 mL   FLUIDS REPLACED: 1200 mL of crystalloid   TOURNIQUET TIME: 100 minutes   DRAINS: 2 medium Hemovac drains   SOFT TISSUE RELEASES: Anterior cruciate ligament, posterior cruciate ligament, deep medial collateral ligament, patellofemoral ligament   IMPLANTS UTILIZED: DePuy Attune size 5N posterior stabilized femoral component (cemented), size 4 rotating platform tibial component (cemented), 38 mm medialized dome patella (cemented), and a 12 mm stabilized rotating platform polyethylene insert.  Discharge Diagnoses: Patient Active Problem List   Diagnosis Date Noted   Total knee replacement status 12/12/2021   Allergic rhinitis due to animal (cat) (dog) hair and dander 12/11/2021   Allergic rhinitis 09/16/2020   Cough 09/16/2020   Allergic rhinitis due to pollen 08/15/2020   Statin myopathy 08/10/2020   Dizziness 07/09/2019   Medicare annual wellness visit, subsequent 05/09/2019   Medicare annual wellness visit, initial 03/29/2018   Left knee pain 12/13/2017   Primary osteoarthritis of left knee 12/13/2017   History of UTI 11/11/2017   Encounter for screening mammogram for breast cancer 05/08/2016   Estrogen deficiency 02/16/2016   Contact with or exposure to communicable disease 02/16/2016   Pre-operative cardiovascular examination 07/21/2013   GERD (gastroesophageal reflux disease) 06/18/2013   Varicose veins of lower extremities  with other complications 36/14/4315   Lumbar disc disease 08/15/2011   Joint pain 08/15/2011   Osteopenia 01/31/2011   Post-menopausal 01/31/2011   Hypokalemia 01/31/2011   Hyperlipidemia 03/24/2008   ESSENTIAL HYPERTENSION, BENIGN 03/24/2008   COLONIC POLYPS, HX OF 03/24/2008    Past Medical History:  Diagnosis Date   Allergic rhinitis    Allergy    Bronchitis    more frequent in the past   Cataract    "suspect per pt"   Colon polyps    history of   GERD (gastroesophageal reflux disease)    Glaucoma    suspected   Heart murmur    History of kidney stones    suspects that she had one and past it   Hyperlipidemia    Hypertension    Osteoarthritis of knee    Osteopenia    PONV (postoperative nausea and vomiting)    with tubal ligation years ago     Transfusion:    Consultants (if any):   Discharged Condition: Improved  Hospital Course: Amy Herman is an 76 y.o. female who was admitted 12/12/2021 with a diagnosis of right knee osteoarthritis and went to the operating room on 12/12/2021 and underwent right total knee arthroplasty. The patient received perioperative antibiotics for prophylaxis (see below). The patient tolerated the procedure well and was transported to PACU in stable condition. After meeting PACU criteria, the patient was subsequently transferred to the Orthopaedics/Rehabilitation unit.   The patient received DVT prophylaxis in the form of early mobilization, Lovenox, TED hose, and SCDs . A sacral pad had been placed and heels were elevated off of the bed with rolled towels in order to protect skin integrity. Foley catheter was discontinued on postoperative day #0. Wound drains were discontinued on postoperative day #  1. The surgical incision was healing well without signs of infection.  Physical therapy was initiated postoperatively for transfers, gait training, and strengthening. Occupational therapy was initiated for activities of daily living and  evaluation for assisted devices. Rehabilitation goals were reviewed in detail with the patient. The patient made steady progress with physical therapy and physical therapy recommended discharge to Home.   The patient achieved the preliminary goals of this hospitalization and was felt to be medically and orthopaedically appropriate for discharge.  She was given perioperative antibiotics:  Anti-infectives (From admission, onward)    Start     Dose/Rate Route Frequency Ordered Stop   12/12/21 2000  ceFAZolin (ANCEF) IVPB 2g/100 mL premix        2 g 200 mL/hr over 30 Minutes Intravenous Every 8 hours 12/12/21 1809 12/13/21 0500   12/12/21 0958  ceFAZolin (ANCEF) 2-4 GM/100ML-% IVPB       Note to Pharmacy: Maryagnes Amos B: cabinet override      12/12/21 0958 12/12/21 1259   12/12/21 0600  ceFAZolin (ANCEF) IVPB 2g/100 mL premix        2 g 200 mL/hr over 30 Minutes Intravenous On call to O.R. 12/12/21 0142 12/12/21 1312     .  Recent vital signs:  Vitals:   12/13/21 0741 12/13/21 1100  BP: 127/67 (!) 104/58  Pulse:  64  Resp:  16  Temp: 98.6 F (37 C) 98.5 F (36.9 C)  SpO2: 98%     Recent laboratory studies:  No results for input(s): "WBC", "HGB", "HCT", "PLT", "K", "CL", "CO2", "BUN", "CREATININE", "GLUCOSE", "CALCIUM", "LABPT", "INR" in the last 72 hours.  Diagnostic Studies: DG Knee Right Port  Result Date: 12/12/2021 CLINICAL DATA:  Postop EXAM: PORTABLE RIGHT KNEE - 2 VIEW COMPARISON:  Radiograph dated January 24, 2016 FINDINGS: Interval postsurgical changes from right total knee arthroplasty. Arthroplasty components appear in their expected alignment. No periprosthetic fracture is identified. Expected postoperative changes within the overlying soft tissues. IMPRESSION: Expected postsurgical findings of right total knee arthroplasty. Electronically Signed   By: Yetta Glassman M.D.   On: 12/12/2021 16:37   MM 3D SCREEN BREAST BILATERAL  Result Date: 12/09/2021 CLINICAL  DATA:  Screening. EXAM: DIGITAL SCREENING BILATERAL MAMMOGRAM WITH TOMOSYNTHESIS AND CAD TECHNIQUE: Bilateral screening digital craniocaudal and mediolateral oblique mammograms were obtained. Bilateral screening digital breast tomosynthesis was performed. The images were evaluated with computer-aided detection. COMPARISON:  Previous exam(s). ACR Breast Density Category b: There are scattered areas of fibroglandular density. FINDINGS: There are no findings suspicious for malignancy. IMPRESSION: No mammographic evidence of malignancy. A result letter of this screening mammogram will be mailed directly to the patient. RECOMMENDATION: Screening mammogram in one year. (Code:[id number]-01Y) BI-RADS CATEGORY  1: Negative. Electronically Signed   By: Franki Cabot M.D.   On: 12/09/2021 14:26    Discharge Medications:   Allergies as of 12/13/2021       Reactions   Cefuroxime Axetil    REACTION: bumps on tounge   Cefuroxime Axetil Other (See Comments)   TOLERATED CEFAZOLIN Other reaction(s): RASH Other reaction(s): RASH REACTION: bumps on tounge   Clarithromycin Other (See Comments)   Other reaction(s): OTHER Other reaction(s): OTHER REACTION: GI Biaxin   Levofloxacin Other (See Comments)   Joint pains   Lipitor [atorvastatin] Other (See Comments)   Muscle pain  Muscle pain    Losartan Other (See Comments)   cough cough   Other    Other reaction(s): Unknown   Rosuvastatin Other (See Comments)  REACTION: severe muscle pain   Valsartan    Taken off from MD d/t a recall        Medication List     STOP taking these medications    aspirin EC 81 MG tablet       TAKE these medications    acetaminophen 500 MG tablet Commonly known as: TYLENOL Take 500 mg by mouth every 8 (eight) hours as needed for moderate pain.   albuterol 108 (90 Base) MCG/ACT inhaler Commonly known as: VENTOLIN HFA Inhale 1 puff into the lungs every 6 (six) hours as needed for wheezing or shortness of breath.    bimatoprost 0.01 % Soln Commonly known as: LUMIGAN Place 1 drop into both eyes at bedtime.   calcium citrate-vitamin D 315-200 MG-UNIT tablet Commonly known as: CITRACAL+D Take 1 tablet by mouth daily.   celecoxib 200 MG capsule Commonly known as: CELEBREX Take 1 capsule (200 mg total) by mouth 2 (two) times daily.   cetirizine 10 MG tablet Commonly known as: ZYRTEC Take 10 mg by mouth daily.   enoxaparin 40 MG/0.4ML injection Commonly known as: LOVENOX Inject 0.4 mLs (40 mg total) into the skin daily for 14 days.   EPINEPHrine 0.3 mg/0.3 mL Soaj injection Commonly known as: EPI-PEN Inject 0.3 mg into the muscle as needed for anaphylaxis.   fluticasone 50 MCG/ACT nasal spray Commonly known as: FLONASE Place 2 sprays into both nostrils as needed.   Garlic 3664 MG Caps Take 1,000 mg by mouth daily.   Lumify 0.025 % Soln Generic drug: Brimonidine Tartrate Place 1 drop into both eyes every morning.   montelukast 10 MG tablet Commonly known as: SINGULAIR Take 10 mg by mouth at bedtime as needed (allergies).   Omega 3 1000 MG Caps Take 1,000 mg by mouth daily.   omeprazole 20 MG capsule Commonly known as: PRILOSEC Take 20 mg by mouth daily as needed (acid reflux).   oxyCODONE 5 MG immediate release tablet Commonly known as: Oxy IR/ROXICODONE Take 1 tablet (5 mg total) by mouth every 4 (four) hours as needed for severe pain.   potassium chloride 10 MEQ tablet Commonly known as: KLOR-CON Take 2 tablets (20 mEq  total) by mouth daily. What changed:  how much to take when to take this   SINUS [place] POT NA Place 1 application. into the nose as needed.   traMADol 50 MG tablet Commonly known as: ULTRAM Take 1 tablet (50 mg total) by mouth every 4 (four) hours as needed for moderate pain.   triamterene-hydrochlorothiazide 37.5-25 MG tablet Commonly known as: MAXZIDE-25 TAKE 1 TABLET BY MOUTH DAILY   Vitamin D3 125 MCG (5000 UT) Caps Take 5,000 Units by  mouth daily.               Durable Medical Equipment  (From admission, onward)           Start     Ordered   12/13/21 1016  DME Walker rolling  Once       Comments: youth  Question:  Patient needs a walker to treat with the following condition  Answer:  Total knee replacement status   12/13/21 1016   12/12/21 1809  DME Bedside commode  Once       Question:  Patient needs a bedside commode to treat with the following condition  Answer:  Total knee replacement status   12/12/21 1809            Disposition: Home with home health  PT     Follow-up Information     Duanne Guess, PA-C Follow up on 12/26/2021.   Specialties: Orthopedic Surgery, Emergency Medicine Why: at 10:45am Contact information: [place] [id number] [phone]         Dereck Leep, MD Follow up on 01/24/2022.   Specialty: Orthopedic Surgery Why: at 10:45am Contact information: [place] [address] [place], PA-C 12/13/2021, 2:01 PM

## 2021-12-13 NOTE — Progress Notes (Signed)
  Subjective: 1 Day Post-Op Procedure(s) (LRB): COMPUTER ASSISTED TOTAL KNEE ARTHROPLASTY (Right) Patient reports pain as well-controlled.  Daughter at bedside.  Patient is well, and has had no acute complaints or problems Plan is to go Home after hospital stay. Negative for chest pain and shortness of breath Fever: no Gastrointestinal: negative for nausea and vomiting.  Patient has had a bowel movement.  Objective: Vital signs in last 24 hours: Temp:  [97.2 F (36.2 C)-98.6 F (37 C)] 98.6 F (37 C) (06/13 0741) Pulse Rate:  [59-101] 59 (06/13 0447) Resp:  [14-26] 16 (06/13 0447) BP: (100-157)/(60-82) 127/67 (06/13 0741) SpO2:  [96 %-99 %] 98 % (06/13 0741) Weight:  [60.8 kg] 60.8 kg (06/12 0944)  Intake/Output from previous day:  Intake/Output Summary (Last 24 hours) at 12/13/2021 0931 Last data filed at 12/13/2021 0311 Gross per 24 hour  Intake 2245.19 ml  Output 1270 ml  Net 975.19 ml    Intake/Output this shift: No intake/output data recorded.  Labs: No results for input(s): "HGB" in the last 72 hours. No results for input(s): "WBC", "RBC", "HCT", "PLT" in the last 72 hours. No results for input(s): "NA", "K", "CL", "CO2", "BUN", "CREATININE", "GLUCOSE", "CALCIUM" in the last 72 hours. No results for input(s): "LABPT", "INR" in the last 72 hours.   EXAM General - Patient is Alert, Appropriate, and Oriented Extremity - Neurovascular intact Dorsiflexion/Plantar flexion intact Compartment soft Dressing/Incision -Postoperative dressing remains in place., Polar Care in place and working. , Hemovac in place.  Motor Function - intact, moving foot and toes well on exam. Able to perform independent SLR.  Cardiovascular- Regular rate and rhythm, no murmurs/rubs/gallops Respiratory- Lungs clear to auscultation bilaterally Gastrointestinal- soft, nontender, and active bowel sounds   Assessment/Plan: 1 Day Post-Op Procedure(s) (LRB): COMPUTER ASSISTED TOTAL KNEE  ARTHROPLASTY (Right) Principal Problem:   Total knee replacement status  Estimated body mass index is 25.74 kg/m as calculated from the following:   Height as of this encounter: 5' 0.5" (1.537 m).   Weight as of this encounter: 60.8 kg. Advance diet Up with therapy  Anticipate d/c pending completion of PT goals.     DVT Prophylaxis - Lovenox, Ted hose, and SCDs Weight-Bearing as tolerated to left leg  Cassell Smiles, PA-C [facility] Orthopaedic Surgery 12/13/2021, 9:31 AM

## 2021-12-13 NOTE — Progress Notes (Signed)
Physical Therapy Treatment Patient Details Name: Amy Herman MRN: [id number] DOB: [birth year] Today's Date: 12/13/2021   History of Present Illness Pt is a 76 y.o. female s/p R TKA 12/12/21 secondary to degenerative arthrosis of R knee.  PMH includes htn, GERD, glaucoma, HLD, hypokalemia, lumbar disc disease, varicose veins of B LE's, carpal tunnel release, foot surgery, knee surgery.    PT Comments    Pt resting in bed upon PT arrival; agreeable to PT session.  During session pt modified independent with bed mobility; SBA with transfers using walker;  SBA ambulating 160 feet with youth sized RW; and CGA navigating 4 steps with B railings.  Pt reporting no questions with written HEP issued to pt (supine and sitting ex's performed in morning and afternoon session).  Educated pt and pt's daughter on home safety and safe car transfers.  Pt and pt's daughter report no questions or concerns for home discharge today.  Minimal to no R LE pain noted during session.  Pt's youth sized RW was already delivered to pt's room and therapist adjusted walker for appropriate fit.  PA and nurse notified that pt ready for discharge home today from PT standpoint.   Recommendations for follow up therapy are one component of a multi-disciplinary discharge planning process, led by the attending physician.  Recommendations may be updated based on patient status, additional functional criteria and insurance authorization.  Follow Up Recommendations  Home health PT     Assistance Recommended at Discharge Set up Supervision/Assistance  Patient can return home with the following A little help with bathing/dressing/bathroom;Assistance with cooking/housework;Assist for transportation;Help with stairs or ramp for entrance   Equipment Recommendations  Rolling walker (2 wheels);BSC/3in1 (youth sized)    Recommendations for Other Services OT consult     Precautions / Restrictions Precautions Precautions:  Fall Restrictions Weight Bearing Restrictions: Yes RLE Weight Bearing: Weight bearing as tolerated     Mobility  Bed Mobility Overal bed mobility: Modified Independent             General bed mobility comments: supine to/from sitting edge of bed    Transfers Overall transfer level: Supervision Equipment used: Rolling walker (2 wheels) (youth sized) Transfers: Sit to/from Stand, Bed to chair/wheelchair/BSC Sit to Stand: Supervision   Step pivot transfers: Supervision       General transfer comment: 1 cue for UE/LE placement; x3 trials from bed and x1 trial from recliner    Ambulation/Gait Ambulation/Gait assistance: Supervision Gait Distance (Feet): 160 Feet Assistive device: Rolling walker (2 wheels) (youth sized)   Gait velocity: decreased     General Gait Details: step to progressing to step through gait pattern; minimal cues for positioning within walker and overall gait technique (pt able to verbalize and adjust gait mechanics as needed)   Stairs Stairs: Yes Stairs assistance: Min guard Stair Management: Two rails, Step to pattern, Forwards Number of Stairs: 4 General stair comments: initial vc's for LE sequencing; steady safe stairs navigation   Wheelchair Mobility    Modified Rankin (Stroke Patients Only)       Balance Overall balance assessment: Needs assistance Sitting-balance support: No upper extremity supported, Feet supported Sitting balance-Leahy Scale: Normal Sitting balance - Comments: steady sitting reaching outside BOS   Standing balance support: No upper extremity supported Standing balance-Leahy Scale: Good Standing balance comment: steady standing reaching within BOS  Cognition Arousal/Alertness: Awake/alert Behavior During Therapy: WFL for tasks assessed/performed Overall Cognitive Status: Within Functional Limits for tasks assessed                                 General  Comments: Appropriate response and reaction to cues        Exercises Total Joint Exercises Long Arc Quad: AROM, Strengthening, Right, 10 reps, Seated Knee Flexion: AROM, Strengthening, Right, 10 reps, Seated Goniometric ROM: R knee AROM 3-95 degrees (in sitting)--taken in morning session    General Comments        Pertinent Vitals/Pain Pain Assessment Pain Assessment: 0-10 Pain Score: 1  Pain Location: R LE sciatic pain (chronic per pt report) Pain Descriptors / Indicators: Discomfort Pain Intervention(s): Limited activity within patient's tolerance, Monitored during session, Repositioned    Home Living Family/patient expects to be discharged to:: Private residence Living Arrangements: Children (Daugther) Available Help at Discharge: Family Type of Home: Apartment Home Access: Level entry       Home Layout: One level Home Equipment: Grab bars - tub/shower;Crutches;Cane - single point;Adaptive equipment      Prior Function            PT Goals (current goals can now be found in the care plan section) Acute Rehab PT Goals Patient Stated Goal: to go home today PT Goal Formulation: With patient/family Time For Goal Achievement: 12/27/21 Potential to Achieve Goals: Good Progress towards PT goals: Progressing toward goals    Frequency    BID      PT Plan Current plan remains appropriate    Co-evaluation              AM-PAC PT "6 Clicks" Mobility   Outcome Measure  Help needed turning from your back to your side while in a flat bed without using bedrails?: None Help needed moving from lying on your back to sitting on the side of a flat bed without using bedrails?: None Help needed moving to and from a bed to a chair (including a wheelchair)?: None Help needed standing up from a chair using your arms (e.g., wheelchair or bedside chair)?: None Help needed to walk in hospital room?: A Little Help needed climbing 3-5 steps with a railing? : A Little 6  Click Score: 22    End of Session Equipment Utilized During Treatment: Gait belt Activity Tolerance: Patient tolerated treatment well Patient left: in bed;with call bell/phone within reach;with family/visitor present;Other (comment) (pt getting dressed with assist for home discharge) Nurse Communication: Mobility status;Precautions;Weight bearing status;Other (comment) (pt ready for discharge from PT standpoint) PT Visit Diagnosis: Other abnormalities of gait and mobility (R26.89);Muscle weakness (generalized) (M62.81);Pain Pain - Right/Left: Right Pain - part of body: Knee     Time: 1410-1448 PT Time Calculation (min) (ACUTE ONLY): 38 min  Charges:  $Gait Training: 8-22 mins $Therapeutic Exercise: 8-22 mins $Therapeutic Activity: 8-22 mins                    Leitha Bleak, PT 12/13/21, 4:37 PM

## 2021-12-13 NOTE — Evaluation (Signed)
Occupational Therapy Evaluation Patient Details Name: Amy Herman MRN: [id number] DOB: 05/27/1946 Today's Date: 12/13/2021   History of Present Illness Pt is a 76 y.o. female s/p R TKA 12/12/21 secondary to degenerative arthrosis of R knee.  PMH includes htn, GERD, glaucoma, HLD, hypokalemia, lumbar disc disease, varicose veins of B LE's, carpal tunnel release, foot surgery, knee surgery.   Clinical Impression   Pt seen for OT evaluation this date, POD#1 from above surgery. Pt was independent in all ADL prior to surgery and is eager to return to PLOF with less pain and improved safety and independence. Pt currently requires PRN minimal assist for LB dressing and bathing while in seated position due to limited AROM of R knee. Pt/dtr instructed in polar care mgt, falls prevention strategies, pet care considerations, home/routines modifications, DME/AE for LB bathing and dressing tasks, car transfers, and compression stocking mgt. Pt/dtr verbalized understanding. No additional skilled OT needs at this time. Will sign off. Follow up therapy per surgeon for rehab of knee.     Recommendations for follow up therapy are one component of a multi-disciplinary discharge planning process, led by the attending physician.  Recommendations may be updated based on patient status, additional functional criteria and insurance authorization.   Follow Up Recommendations  No OT follow up    Assistance Recommended at Discharge Intermittent Supervision/Assistance  Patient can return home with the following A little help with bathing/dressing/bathroom;Assist for transportation;Assistance with cooking/housework;Help with stairs or ramp for entrance    Functional Status Assessment  Patient has had a recent decline in their functional status and demonstrates the ability to make significant improvements in function in a reasonable and predictable amount of time.  Equipment Recommendations  BSC/3in1     Recommendations for Other Services       Precautions / Restrictions Precautions Precautions: Fall Restrictions Weight Bearing Restrictions: Yes RLE Weight Bearing: Weight bearing as tolerated      Mobility Bed Mobility               General bed mobility comments: NT, up in recliner    Transfers                          Balance                                           ADL either performed or assessed with clinical judgement   ADL                                         General ADL Comments: Pt requires PRN MIN A For LB ADL tasks, MOD A for compression stocking mgt, daughter able to assist     Vision         Perception     Praxis      Pertinent Vitals/Pain Pain Assessment Pain Assessment: No/denies pain     Hand Dominance     Extremity/Trunk Assessment Upper Extremity Assessment Upper Extremity Assessment: Overall WFL for tasks assessed   Lower Extremity Assessment Lower Extremity Assessment: RLE deficits/detail RLE Deficits / Details: expected post-op ROM/strength deficits, not significantly limiting RLE: Unable to fully assess due to pain   Cervical / Trunk Assessment Cervical / Trunk Assessment: Normal   Communication  Communication Communication: No difficulties   Cognition Arousal/Alertness: Awake/alert Behavior During Therapy: WFL for tasks assessed/performed Overall Cognitive Status: Within Functional Limits for tasks assessed                                       General Comments       Exercises Other Exercises Other Exercises: Pt/dtr instructed in home/routines modifications, compresison stocking mgt, pet care considerations, falls prevention, AE/DME, and car transfers. HAndout provided.   Shoulder Instructions      Home Living Family/patient expects to be discharged to:: Private residence Living Arrangements: Children (Daugther) Available Help at Discharge:  Family Type of Home: Apartment Home Access: Level entry     Home Layout: One level     Bathroom Shower/Tub: Teacher, early years/pre: Standard     Home Equipment: Grab bars - tub/shower;Crutches;Cane - single point;Adaptive equipment Adaptive Equipment: Reacher        Prior Functioning/Environment Prior Level of Function : Independent/Modified Independent                        OT Problem List: Decreased strength      OT Treatment/Interventions:      OT Goals(Current goals can be found in the care plan section) Acute Rehab OT Goals Patient Stated Goal: go home and return to PLOF OT Goal Formulation: All assessment and education complete, DC therapy  OT Frequency:      Co-evaluation              AM-PAC OT "6 Clicks" Daily Activity     Outcome Measure Help from another person eating meals?: None Help from another person taking care of personal grooming?: None Help from another person toileting, which includes using toliet, bedpan, or urinal?: None Help from another person bathing (including washing, rinsing, drying)?: A Little Help from another person to put on and taking off regular upper body clothing?: None Help from another person to put on and taking off regular lower body clothing?: A Little 6 Click Score: 22   End of Session    Activity Tolerance: Patient tolerated treatment well Patient left: in chair;with call bell/phone within reach                   Time: 1320-1339 OT Time Calculation (min): 19 min Charges:  OT General Charges $OT Visit: 1 Visit OT Evaluation $OT Eval Low Complexity: 1 Low OT Treatments $Self Care/Home Management : 8-22 mins  Ardeth Perfect., MPH, MS, OTR/L ascom [phone] 12/13/21, 1:47 PM

## 2021-12-13 NOTE — Progress Notes (Cosign Needed)
Patient is not able to walk the distance required to go the bathroom, or he/she is unable to safely negotiate stairs required to access the bathroom.  A 3in1 BSC will alleviate this problem  

## 2021-12-13 NOTE — Plan of Care (Signed)
  Problem: Education: Goal: Knowledge of General Education information will improve Description: Including pain rating scale, medication(s)/side effects and non-pharmacologic comfort measures Outcome: Progressing   Problem: Health Behavior/Discharge Planning: Goal: Ability to manage health-related needs will improve Outcome: Progressing   Problem: Clinical Measurements: Goal: Ability to maintain clinical measurements within normal limits will improve Outcome: Progressing   Problem: Clinical Measurements: Goal: Cardiovascular complication will be avoided Outcome: Progressing   Problem: Activity: Goal: Risk for activity intolerance will decrease Outcome: Progressing   Problem: Nutrition: Goal: Adequate nutrition will be maintained Outcome: Progressing   Problem: Coping: Goal: Level of anxiety will decrease Outcome: Progressing   Problem: Skin Integrity: Goal: Risk for impaired skin integrity will decrease Outcome: Progressing

## 2021-12-13 NOTE — Progress Notes (Signed)
Post-op dressing removed. , Hemovac removed., and Mini compression dressing applied.    

## 2021-12-13 NOTE — Progress Notes (Signed)
Met with the patient in the room at the bedside The patient lives at Home alone, she will be staying with her daughter Sheppard Evens The patient  currently has no DME The patient will need a youth Rolling walker and a 3 in 1 to be delivered by [employer] to the bedside They have transportation with her daughter They can afford their medication  They are set up with [facility] for Home health services   Admitted for: total knee replacement

## 2021-12-13 NOTE — Plan of Care (Signed)
Patient discharged per MD orders at this time.All discharge instructions,education and medications reviewed with the patient.Pt expressed understanding and will comply with dc instructions.follow up appointments was also communicated to the patient.no verbal c/o or any ssx.Pt was discharged home with HH/PT services per order.Pt was transported home by daughter in a privately owned vehicle.

## 2021-12-13 NOTE — Evaluation (Signed)
Physical Therapy Evaluation Patient Details Name: Amy Herman MRN: [id number] DOB: 1946-05-03 Today's Date: 12/13/2021  History of Present Illness  Pt is a 76 y.o. female s/p R TKA 12/12/21 secondary to degenerative arthrosis of R knee.  PMH includes htn, GERD, glaucoma, HLD, hypokalemia, lumbar disc disease, varicose veins of B LE's, carpal tunnel release, foot surgery, knee surgery.  Clinical Impression  Prior to surgery, pt was independent with ambulation; plans to stay at her daughter's home upon hospital discharge (apt with no steps to enter).  Currently pt is modified independent with bed mobility; CGA with transfers using walker; and CGA to ambulate 200 feet with youth sized RW.  Pain 0-1/10 R LE during session.  Able to perform R LE SLR independently.  R knee flexion AROM to 95 degrees.  Pt would benefit from skilled PT to address noted impairments and functional limitations (see below for any additional details).  Upon hospital discharge, pt would benefit from [place] and support from family.    Recommendations for follow up therapy are one component of a multi-disciplinary discharge planning process, led by the attending physician.  Recommendations may be updated based on patient status, additional functional criteria and insurance authorization.  Follow Up Recommendations Home health PT    Assistance Recommended at Discharge Set up Supervision/Assistance  Patient can return home with the following  A little help with bathing/dressing/bathroom;Assistance with cooking/housework;Assist for transportation;Help with stairs or ramp for entrance    Equipment Recommendations Rolling walker (2 wheels);BSC/3in1 (youth sized)  Recommendations for Other Services  OT consult    Functional Status Assessment Patient has had a recent decline in their functional status and demonstrates the ability to make significant improvements in function in a reasonable and predictable amount of time.      Precautions / Restrictions Precautions Precautions: Fall Restrictions Weight Bearing Restrictions: Yes RLE Weight Bearing: Weight bearing as tolerated      Mobility  Bed Mobility Overal bed mobility: Modified Independent             General bed mobility comments: Supine to sitting edge of bed (no difficulties noted)    Transfers Overall transfer level: Needs assistance Equipment used: Rolling walker (2 wheels) (youth sized) Transfers: Sit to/from Stand, Bed to chair/wheelchair/BSC Sit to Stand: Min guard   Step pivot transfers: Min guard (stand step turn bed to recliner with walker)       General transfer comment: vc's for UE/LE placement initially; x1 trial from bed and x2 trials from recliner    Ambulation/Gait Ambulation/Gait assistance: Min guard Gait Distance (Feet): 200 Feet Assistive device: Rolling walker (2 wheels) (youth sized)   Gait velocity: decreased     General Gait Details: partial step to/step to gait pattern; initial cueing for positioning within walker and overall gait technique  Stairs            Wheelchair Mobility    Modified Rankin (Stroke Patients Only)       Balance Overall balance assessment: Needs assistance Sitting-balance support: No upper extremity supported, Feet supported Sitting balance-Leahy Scale: Normal Sitting balance - Comments: steady sitting reaching outside BOS   Standing balance support: Bilateral upper extremity supported, During functional activity Standing balance-Leahy Scale: Good Standing balance comment: steady ambulating with walker                             Pertinent Vitals/Pain Pain Assessment Pain Assessment: 0-10 Pain Score: 0-No pain (1/10 with ambulation)  Pain Location: R LE sciatic pain (chronic per pt report) Pain Descriptors / Indicators: Discomfort Pain Intervention(s): Limited activity within patient's tolerance, Monitored during session, Premedicated before session,  Repositioned HR stable and WFL throughout treatment session.    Home Living Family/patient expects to be discharged to:: Private residence Living Arrangements: Children Engineer, maintenance) Available Help at Discharge: Family Type of Home: Apartment Home Access: Level entry       Home Layout: One level Home Equipment: Grab bars - tub/shower;Crutches;Cane - single point      Prior Function Prior Level of Function : Independent/Modified Independent                     Hand Dominance        Extremity/Trunk Assessment   Upper Extremity Assessment Upper Extremity Assessment: Overall WFL for tasks assessed    Lower Extremity Assessment Lower Extremity Assessment: RLE deficits/detail (L LE WFL) RLE Deficits / Details: able to perform R LE SLR independently; at least 3/5 hip flexion, knee flexion/extension, and DF/PF AROM RLE: Unable to fully assess due to pain    Cervical / Trunk Assessment Cervical / Trunk Assessment: Normal  Communication   Communication: No difficulties  Cognition Arousal/Alertness: Awake/alert Behavior During Therapy: WFL for tasks assessed/performed Overall Cognitive Status: Within Functional Limits for tasks assessed                                 General Comments: Tends to be quick to initiate activities.        General Comments  Pt agreeable to PT session; pt's daughter present during session.    Exercises Total Joint Exercises Ankle Circles/Pumps: AROM, Strengthening, Both, 10 reps, Supine Quad Sets: AROM, Strengthening, Both, 10 reps, Supine Short Arc Quad: AROM, Strengthening, Right, 10 reps, Supine Heel Slides: AAROM, Strengthening, Right, 10 reps, Supine Hip ABduction/ADduction: AROM, Strengthening, Right, 10 reps, Supine Straight Leg Raises: AROM, Strengthening, Right, 10 reps, Supine Goniometric ROM: R knee AROM 3-95 degrees (in sitting)   Assessment/Plan    PT Assessment Patient needs continued PT services  PT  Problem List Decreased strength;Decreased range of motion;Decreased activity tolerance;Decreased balance;Decreased mobility;Decreased knowledge of use of DME;Decreased knowledge of precautions;Pain;Decreased skin integrity       PT Treatment Interventions DME instruction;Gait training;Stair training;Functional mobility training;Therapeutic activities;Therapeutic exercise;Balance training;Patient/family education    PT Goals (Current goals can be found in the Care Plan section)  Acute Rehab PT Goals Patient Stated Goal: to go home today PT Goal Formulation: With patient/family Time For Goal Achievement: 12/27/21 Potential to Achieve Goals: Good    Frequency BID     Co-evaluation               AM-PAC PT "6 Clicks" Mobility  Outcome Measure Help needed turning from your back to your side while in a flat bed without using bedrails?: None Help needed moving from lying on your back to sitting on the side of a flat bed without using bedrails?: None Help needed moving to and from a bed to a chair (including a wheelchair)?: A Little Help needed standing up from a chair using your arms (e.g., wheelchair or bedside chair)?: A Little Help needed to walk in hospital room?: A Little Help needed climbing 3-5 steps with a railing? : A Little 6 Click Score: 20    End of Session Equipment Utilized During Treatment: Gait belt Activity Tolerance: Patient tolerated treatment well Patient left: in  chair;with call bell/phone within reach;with chair alarm set;with nursing/sitter in room;with family/visitor present;with SCD's reapplied;Other (comment) (B heels floating via towel rolls; polar care in place) Nurse Communication: Mobility status;Precautions;Weight bearing status PT Visit Diagnosis: Other abnormalities of gait and mobility (R26.89);Muscle weakness (generalized) (M62.81);Pain Pain - Right/Left: Right Pain - part of body: Knee    Time: 0917-1008 PT Time Calculation (min) (ACUTE ONLY):  51 min   Charges:   PT Evaluation $PT Eval Low Complexity: 1 Low PT Treatments $Gait Training: 8-22 mins $Therapeutic Exercise: 8-22 mins       Leitha Bleak, PT 12/13/21, 1:16 PM

## 2021-12-14 DIAGNOSIS — Z86010 Personal history of colonic polyps: Secondary | ICD-10-CM | POA: Diagnosis not present

## 2021-12-14 DIAGNOSIS — I10 Essential (primary) hypertension: Secondary | ICD-10-CM | POA: Diagnosis not present

## 2021-12-14 DIAGNOSIS — M8580 Other specified disorders of bone density and structure, unspecified site: Secondary | ICD-10-CM | POA: Diagnosis not present

## 2021-12-14 DIAGNOSIS — Z87442 Personal history of urinary calculi: Secondary | ICD-10-CM | POA: Diagnosis not present

## 2021-12-14 DIAGNOSIS — H269 Unspecified cataract: Secondary | ICD-10-CM | POA: Diagnosis not present

## 2021-12-14 DIAGNOSIS — Z87440 Personal history of urinary (tract) infections: Secondary | ICD-10-CM | POA: Diagnosis not present

## 2021-12-14 DIAGNOSIS — J301 Allergic rhinitis due to pollen: Secondary | ICD-10-CM | POA: Diagnosis not present

## 2021-12-14 DIAGNOSIS — M5136 Other intervertebral disc degeneration, lumbar region: Secondary | ICD-10-CM | POA: Diagnosis not present

## 2021-12-14 DIAGNOSIS — J3081 Allergic rhinitis due to animal (cat) (dog) hair and dander: Secondary | ICD-10-CM | POA: Diagnosis not present

## 2021-12-14 DIAGNOSIS — E079 Disorder of thyroid, unspecified: Secondary | ICD-10-CM | POA: Diagnosis not present

## 2021-12-14 DIAGNOSIS — Z7901 Long term (current) use of anticoagulants: Secondary | ICD-10-CM | POA: Diagnosis not present

## 2021-12-14 DIAGNOSIS — E785 Hyperlipidemia, unspecified: Secondary | ICD-10-CM | POA: Diagnosis not present

## 2021-12-14 DIAGNOSIS — H409 Unspecified glaucoma: Secondary | ICD-10-CM | POA: Diagnosis not present

## 2021-12-14 DIAGNOSIS — K219 Gastro-esophageal reflux disease without esophagitis: Secondary | ICD-10-CM | POA: Diagnosis not present

## 2021-12-14 DIAGNOSIS — Z96651 Presence of right artificial knee joint: Secondary | ICD-10-CM | POA: Diagnosis not present

## 2021-12-14 DIAGNOSIS — M1712 Unilateral primary osteoarthritis, left knee: Secondary | ICD-10-CM | POA: Diagnosis not present

## 2021-12-14 DIAGNOSIS — Z471 Aftercare following joint replacement surgery: Secondary | ICD-10-CM | POA: Diagnosis not present

## 2021-12-14 DIAGNOSIS — Z791 Long term (current) use of non-steroidal anti-inflammatories (NSAID): Secondary | ICD-10-CM | POA: Diagnosis not present

## 2021-12-16 ENCOUNTER — Other Ambulatory Visit: Payer: Self-pay

## 2021-12-16 DIAGNOSIS — I10 Essential (primary) hypertension: Secondary | ICD-10-CM | POA: Diagnosis not present

## 2021-12-16 DIAGNOSIS — Z96651 Presence of right artificial knee joint: Secondary | ICD-10-CM | POA: Diagnosis not present

## 2021-12-16 DIAGNOSIS — K219 Gastro-esophageal reflux disease without esophagitis: Secondary | ICD-10-CM | POA: Diagnosis not present

## 2021-12-16 DIAGNOSIS — M1712 Unilateral primary osteoarthritis, left knee: Secondary | ICD-10-CM | POA: Diagnosis not present

## 2021-12-16 DIAGNOSIS — E785 Hyperlipidemia, unspecified: Secondary | ICD-10-CM | POA: Diagnosis not present

## 2021-12-16 DIAGNOSIS — Z7901 Long term (current) use of anticoagulants: Secondary | ICD-10-CM | POA: Diagnosis not present

## 2021-12-16 DIAGNOSIS — Z87440 Personal history of urinary (tract) infections: Secondary | ICD-10-CM | POA: Diagnosis not present

## 2021-12-16 DIAGNOSIS — Z87442 Personal history of urinary calculi: Secondary | ICD-10-CM | POA: Diagnosis not present

## 2021-12-16 DIAGNOSIS — J3081 Allergic rhinitis due to animal (cat) (dog) hair and dander: Secondary | ICD-10-CM | POA: Diagnosis not present

## 2021-12-16 DIAGNOSIS — H269 Unspecified cataract: Secondary | ICD-10-CM | POA: Diagnosis not present

## 2021-12-16 DIAGNOSIS — E079 Disorder of thyroid, unspecified: Secondary | ICD-10-CM | POA: Diagnosis not present

## 2021-12-16 DIAGNOSIS — Z791 Long term (current) use of non-steroidal anti-inflammatories (NSAID): Secondary | ICD-10-CM | POA: Diagnosis not present

## 2021-12-16 DIAGNOSIS — Z86010 Personal history of colonic polyps: Secondary | ICD-10-CM | POA: Diagnosis not present

## 2021-12-16 DIAGNOSIS — M5136 Other intervertebral disc degeneration, lumbar region: Secondary | ICD-10-CM | POA: Diagnosis not present

## 2021-12-16 DIAGNOSIS — M8580 Other specified disorders of bone density and structure, unspecified site: Secondary | ICD-10-CM | POA: Diagnosis not present

## 2021-12-16 DIAGNOSIS — J301 Allergic rhinitis due to pollen: Secondary | ICD-10-CM | POA: Diagnosis not present

## 2021-12-16 DIAGNOSIS — H409 Unspecified glaucoma: Secondary | ICD-10-CM | POA: Diagnosis not present

## 2021-12-16 DIAGNOSIS — Z471 Aftercare following joint replacement surgery: Secondary | ICD-10-CM | POA: Diagnosis not present

## 2021-12-16 MED ORDER — POTASSIUM CHLORIDE ER 10 MEQ PO TBCR: EXTENDED_RELEASE_TABLET | ORAL | 0 refills | Status: DC

## 2021-12-16 MED ORDER — POTASSIUM CHLORIDE ER 10 MEQ PO TBCR: EXTENDED_RELEASE_TABLET | ORAL | 2 refills | Status: DC

## 2021-12-16 NOTE — Telephone Encounter (Signed)
Patient called requesting to have 30 day supply to local. And a long therm sent to mail order. I do see where she has been on continuously and her potassium was checked recently and was wnl. Not sure If she needs to continue. She will talk last one on Saturday

## 2021-12-18 DIAGNOSIS — M1712 Unilateral primary osteoarthritis, left knee: Secondary | ICD-10-CM | POA: Diagnosis not present

## 2021-12-18 DIAGNOSIS — J301 Allergic rhinitis due to pollen: Secondary | ICD-10-CM | POA: Diagnosis not present

## 2021-12-18 DIAGNOSIS — Z86010 Personal history of colonic polyps: Secondary | ICD-10-CM | POA: Diagnosis not present

## 2021-12-18 DIAGNOSIS — Z87442 Personal history of urinary calculi: Secondary | ICD-10-CM | POA: Diagnosis not present

## 2021-12-18 DIAGNOSIS — E785 Hyperlipidemia, unspecified: Secondary | ICD-10-CM | POA: Diagnosis not present

## 2021-12-18 DIAGNOSIS — J3081 Allergic rhinitis due to animal (cat) (dog) hair and dander: Secondary | ICD-10-CM | POA: Diagnosis not present

## 2021-12-18 DIAGNOSIS — H409 Unspecified glaucoma: Secondary | ICD-10-CM | POA: Diagnosis not present

## 2021-12-18 DIAGNOSIS — Z96651 Presence of right artificial knee joint: Secondary | ICD-10-CM | POA: Diagnosis not present

## 2021-12-18 DIAGNOSIS — I10 Essential (primary) hypertension: Secondary | ICD-10-CM | POA: Diagnosis not present

## 2021-12-18 DIAGNOSIS — E079 Disorder of thyroid, unspecified: Secondary | ICD-10-CM | POA: Diagnosis not present

## 2021-12-18 DIAGNOSIS — Z87440 Personal history of urinary (tract) infections: Secondary | ICD-10-CM | POA: Diagnosis not present

## 2021-12-18 DIAGNOSIS — Z471 Aftercare following joint replacement surgery: Secondary | ICD-10-CM | POA: Diagnosis not present

## 2021-12-18 DIAGNOSIS — Z7901 Long term (current) use of anticoagulants: Secondary | ICD-10-CM | POA: Diagnosis not present

## 2021-12-18 DIAGNOSIS — K219 Gastro-esophageal reflux disease without esophagitis: Secondary | ICD-10-CM | POA: Diagnosis not present

## 2021-12-18 DIAGNOSIS — Z791 Long term (current) use of non-steroidal anti-inflammatories (NSAID): Secondary | ICD-10-CM | POA: Diagnosis not present

## 2021-12-18 DIAGNOSIS — M8580 Other specified disorders of bone density and structure, unspecified site: Secondary | ICD-10-CM | POA: Diagnosis not present

## 2021-12-18 DIAGNOSIS — M5136 Other intervertebral disc degeneration, lumbar region: Secondary | ICD-10-CM | POA: Diagnosis not present

## 2021-12-18 DIAGNOSIS — H269 Unspecified cataract: Secondary | ICD-10-CM | POA: Diagnosis not present

## 2021-12-19 ENCOUNTER — Other Ambulatory Visit: Payer: Self-pay | Admitting: Family Medicine

## 2021-12-19 DIAGNOSIS — E079 Disorder of thyroid, unspecified: Secondary | ICD-10-CM | POA: Diagnosis not present

## 2021-12-19 DIAGNOSIS — M5136 Other intervertebral disc degeneration, lumbar region: Secondary | ICD-10-CM | POA: Diagnosis not present

## 2021-12-19 DIAGNOSIS — H269 Unspecified cataract: Secondary | ICD-10-CM | POA: Diagnosis not present

## 2021-12-19 DIAGNOSIS — Z86010 Personal history of colonic polyps: Secondary | ICD-10-CM | POA: Diagnosis not present

## 2021-12-19 DIAGNOSIS — J3081 Allergic rhinitis due to animal (cat) (dog) hair and dander: Secondary | ICD-10-CM | POA: Diagnosis not present

## 2021-12-19 DIAGNOSIS — H409 Unspecified glaucoma: Secondary | ICD-10-CM | POA: Diagnosis not present

## 2021-12-19 DIAGNOSIS — E785 Hyperlipidemia, unspecified: Secondary | ICD-10-CM | POA: Diagnosis not present

## 2021-12-19 DIAGNOSIS — J301 Allergic rhinitis due to pollen: Secondary | ICD-10-CM | POA: Diagnosis not present

## 2021-12-19 DIAGNOSIS — Z791 Long term (current) use of non-steroidal anti-inflammatories (NSAID): Secondary | ICD-10-CM | POA: Diagnosis not present

## 2021-12-19 DIAGNOSIS — Z87440 Personal history of urinary (tract) infections: Secondary | ICD-10-CM | POA: Diagnosis not present

## 2021-12-19 DIAGNOSIS — Z471 Aftercare following joint replacement surgery: Secondary | ICD-10-CM | POA: Diagnosis not present

## 2021-12-19 DIAGNOSIS — M1712 Unilateral primary osteoarthritis, left knee: Secondary | ICD-10-CM | POA: Diagnosis not present

## 2021-12-19 DIAGNOSIS — Z7901 Long term (current) use of anticoagulants: Secondary | ICD-10-CM | POA: Diagnosis not present

## 2021-12-19 DIAGNOSIS — K219 Gastro-esophageal reflux disease without esophagitis: Secondary | ICD-10-CM | POA: Diagnosis not present

## 2021-12-19 DIAGNOSIS — M8580 Other specified disorders of bone density and structure, unspecified site: Secondary | ICD-10-CM | POA: Diagnosis not present

## 2021-12-19 DIAGNOSIS — Z87442 Personal history of urinary calculi: Secondary | ICD-10-CM | POA: Diagnosis not present

## 2021-12-19 DIAGNOSIS — Z96651 Presence of right artificial knee joint: Secondary | ICD-10-CM | POA: Diagnosis not present

## 2021-12-19 DIAGNOSIS — I10 Essential (primary) hypertension: Secondary | ICD-10-CM | POA: Diagnosis not present

## 2021-12-21 DIAGNOSIS — M8580 Other specified disorders of bone density and structure, unspecified site: Secondary | ICD-10-CM | POA: Diagnosis not present

## 2021-12-21 DIAGNOSIS — Z471 Aftercare following joint replacement surgery: Secondary | ICD-10-CM | POA: Diagnosis not present

## 2021-12-21 DIAGNOSIS — I10 Essential (primary) hypertension: Secondary | ICD-10-CM | POA: Diagnosis not present

## 2021-12-21 DIAGNOSIS — M1712 Unilateral primary osteoarthritis, left knee: Secondary | ICD-10-CM | POA: Diagnosis not present

## 2021-12-21 DIAGNOSIS — J301 Allergic rhinitis due to pollen: Secondary | ICD-10-CM | POA: Diagnosis not present

## 2021-12-21 DIAGNOSIS — Z791 Long term (current) use of non-steroidal anti-inflammatories (NSAID): Secondary | ICD-10-CM | POA: Diagnosis not present

## 2021-12-21 DIAGNOSIS — K219 Gastro-esophageal reflux disease without esophagitis: Secondary | ICD-10-CM | POA: Diagnosis not present

## 2021-12-21 DIAGNOSIS — Z96651 Presence of right artificial knee joint: Secondary | ICD-10-CM | POA: Diagnosis not present

## 2021-12-21 DIAGNOSIS — M5136 Other intervertebral disc degeneration, lumbar region: Secondary | ICD-10-CM | POA: Diagnosis not present

## 2021-12-21 DIAGNOSIS — H269 Unspecified cataract: Secondary | ICD-10-CM | POA: Diagnosis not present

## 2021-12-21 DIAGNOSIS — Z87442 Personal history of urinary calculi: Secondary | ICD-10-CM | POA: Diagnosis not present

## 2021-12-21 DIAGNOSIS — H409 Unspecified glaucoma: Secondary | ICD-10-CM | POA: Diagnosis not present

## 2021-12-21 DIAGNOSIS — Z87440 Personal history of urinary (tract) infections: Secondary | ICD-10-CM | POA: Diagnosis not present

## 2021-12-21 DIAGNOSIS — E079 Disorder of thyroid, unspecified: Secondary | ICD-10-CM | POA: Diagnosis not present

## 2021-12-21 DIAGNOSIS — J3081 Allergic rhinitis due to animal (cat) (dog) hair and dander: Secondary | ICD-10-CM | POA: Diagnosis not present

## 2021-12-21 DIAGNOSIS — Z86010 Personal history of colonic polyps: Secondary | ICD-10-CM | POA: Diagnosis not present

## 2021-12-21 DIAGNOSIS — Z7901 Long term (current) use of anticoagulants: Secondary | ICD-10-CM | POA: Diagnosis not present

## 2021-12-21 DIAGNOSIS — E785 Hyperlipidemia, unspecified: Secondary | ICD-10-CM | POA: Diagnosis not present

## 2021-12-23 DIAGNOSIS — J3081 Allergic rhinitis due to animal (cat) (dog) hair and dander: Secondary | ICD-10-CM | POA: Diagnosis not present

## 2021-12-23 DIAGNOSIS — H409 Unspecified glaucoma: Secondary | ICD-10-CM | POA: Diagnosis not present

## 2021-12-23 DIAGNOSIS — M8580 Other specified disorders of bone density and structure, unspecified site: Secondary | ICD-10-CM | POA: Diagnosis not present

## 2021-12-23 DIAGNOSIS — Z791 Long term (current) use of non-steroidal anti-inflammatories (NSAID): Secondary | ICD-10-CM | POA: Diagnosis not present

## 2021-12-23 DIAGNOSIS — Z87440 Personal history of urinary (tract) infections: Secondary | ICD-10-CM | POA: Diagnosis not present

## 2021-12-23 DIAGNOSIS — Z7901 Long term (current) use of anticoagulants: Secondary | ICD-10-CM | POA: Diagnosis not present

## 2021-12-23 DIAGNOSIS — Z471 Aftercare following joint replacement surgery: Secondary | ICD-10-CM | POA: Diagnosis not present

## 2021-12-23 DIAGNOSIS — M1712 Unilateral primary osteoarthritis, left knee: Secondary | ICD-10-CM | POA: Diagnosis not present

## 2021-12-23 DIAGNOSIS — E785 Hyperlipidemia, unspecified: Secondary | ICD-10-CM | POA: Diagnosis not present

## 2021-12-23 DIAGNOSIS — H269 Unspecified cataract: Secondary | ICD-10-CM | POA: Diagnosis not present

## 2021-12-23 DIAGNOSIS — Z86010 Personal history of colonic polyps: Secondary | ICD-10-CM | POA: Diagnosis not present

## 2021-12-23 DIAGNOSIS — M5136 Other intervertebral disc degeneration, lumbar region: Secondary | ICD-10-CM | POA: Diagnosis not present

## 2021-12-23 DIAGNOSIS — Z87442 Personal history of urinary calculi: Secondary | ICD-10-CM | POA: Diagnosis not present

## 2021-12-23 DIAGNOSIS — E079 Disorder of thyroid, unspecified: Secondary | ICD-10-CM | POA: Diagnosis not present

## 2021-12-23 DIAGNOSIS — I10 Essential (primary) hypertension: Secondary | ICD-10-CM | POA: Diagnosis not present

## 2021-12-23 DIAGNOSIS — Z96651 Presence of right artificial knee joint: Secondary | ICD-10-CM | POA: Diagnosis not present

## 2021-12-23 DIAGNOSIS — J301 Allergic rhinitis due to pollen: Secondary | ICD-10-CM | POA: Diagnosis not present

## 2021-12-23 DIAGNOSIS — K219 Gastro-esophageal reflux disease without esophagitis: Secondary | ICD-10-CM | POA: Diagnosis not present

## 2021-12-26 DIAGNOSIS — Z96651 Presence of right artificial knee joint: Secondary | ICD-10-CM | POA: Diagnosis not present

## 2021-12-29 ENCOUNTER — Telehealth: Payer: Self-pay | Admitting: Family Medicine

## 2021-12-29 NOTE — Telephone Encounter (Signed)
Left message for patient to call back and schedule Medicare Annual Wellness Visit (AWV) either virtually or phone   Last AWV ;08/03/20    I left my direct # [phone]

## 2021-12-30 DIAGNOSIS — Z96651 Presence of right artificial knee joint: Secondary | ICD-10-CM | POA: Diagnosis not present

## 2022-01-04 DIAGNOSIS — Z96651 Presence of right artificial knee joint: Secondary | ICD-10-CM | POA: Diagnosis not present

## 2022-01-04 DIAGNOSIS — Z471 Aftercare following joint replacement surgery: Secondary | ICD-10-CM | POA: Diagnosis not present

## 2022-01-05 DIAGNOSIS — J3089 Other allergic rhinitis: Secondary | ICD-10-CM | POA: Diagnosis not present

## 2022-01-06 DIAGNOSIS — Z96651 Presence of right artificial knee joint: Secondary | ICD-10-CM | POA: Diagnosis not present

## 2022-01-09 DIAGNOSIS — Z96651 Presence of right artificial knee joint: Secondary | ICD-10-CM | POA: Diagnosis not present

## 2022-01-12 DIAGNOSIS — Z96651 Presence of right artificial knee joint: Secondary | ICD-10-CM | POA: Diagnosis not present

## 2022-01-12 DIAGNOSIS — J3089 Other allergic rhinitis: Secondary | ICD-10-CM | POA: Diagnosis not present

## 2022-01-20 DIAGNOSIS — J3089 Other allergic rhinitis: Secondary | ICD-10-CM | POA: Diagnosis not present

## 2022-01-20 DIAGNOSIS — Z96651 Presence of right artificial knee joint: Secondary | ICD-10-CM | POA: Diagnosis not present

## 2022-01-24 DIAGNOSIS — Z96651 Presence of right artificial knee joint: Secondary | ICD-10-CM | POA: Diagnosis not present

## 2022-01-24 DIAGNOSIS — J3089 Other allergic rhinitis: Secondary | ICD-10-CM | POA: Diagnosis not present

## 2022-01-31 DIAGNOSIS — J3089 Other allergic rhinitis: Secondary | ICD-10-CM | POA: Diagnosis not present

## 2022-02-07 DIAGNOSIS — J3089 Other allergic rhinitis: Secondary | ICD-10-CM | POA: Diagnosis not present

## 2022-02-13 ENCOUNTER — Encounter: Payer: Self-pay | Admitting: Ophthalmology

## 2022-02-14 NOTE — Discharge Instructions (Signed)

## 2022-02-15 DIAGNOSIS — H401131 Primary open-angle glaucoma, bilateral, mild stage: Secondary | ICD-10-CM | POA: Diagnosis not present

## 2022-02-16 ENCOUNTER — Ambulatory Visit: Payer: Medicare Other | Admitting: Anesthesiology

## 2022-02-16 ENCOUNTER — Encounter
Admission: RE | Disposition: A | Discharge status: Home / Self Care | Payer: Self-pay | Source: Home / Self Care | Attending: Ophthalmology

## 2022-02-16 ENCOUNTER — Encounter: Payer: Self-pay | Admitting: Ophthalmology

## 2022-02-16 ENCOUNTER — Ambulatory Visit (AMBULATORY_SURGERY_CENTER): Payer: Medicare Other | Admitting: Anesthesiology

## 2022-02-16 ENCOUNTER — Ambulatory Visit
Admission: RE | Admit: 2022-02-16 | Discharge: 2022-02-16 | Disposition: A | Discharge status: Home / Self Care | Payer: Medicare Other | Attending: Ophthalmology | Admitting: Ophthalmology

## 2022-02-16 ENCOUNTER — Other Ambulatory Visit: Payer: Self-pay

## 2022-02-16 DIAGNOSIS — K219 Gastro-esophageal reflux disease without esophagitis: Secondary | ICD-10-CM | POA: Diagnosis not present

## 2022-02-16 DIAGNOSIS — H02403 Unspecified ptosis of bilateral eyelids: Secondary | ICD-10-CM

## 2022-02-16 DIAGNOSIS — H02834 Dermatochalasis of left upper eyelid: Secondary | ICD-10-CM | POA: Insufficient documentation

## 2022-02-16 DIAGNOSIS — H02831 Dermatochalasis of right upper eyelid: Secondary | ICD-10-CM | POA: Insufficient documentation

## 2022-02-16 DIAGNOSIS — I10 Essential (primary) hypertension: Secondary | ICD-10-CM | POA: Diagnosis not present

## 2022-02-16 DIAGNOSIS — Z87891 Personal history of nicotine dependence: Secondary | ICD-10-CM | POA: Insufficient documentation

## 2022-02-16 DIAGNOSIS — Z79899 Other long term (current) drug therapy: Secondary | ICD-10-CM | POA: Diagnosis not present

## 2022-02-16 HISTORY — PX: BROW LIFT: SHX178

## 2022-02-16 HISTORY — DX: Encounter for fitting and adjustment of orthodontic device: Z46.4

## 2022-02-16 HISTORY — DX: Reserved for inherently not codable concepts without codable children: IMO0001

## 2022-02-16 SURGERY — BLEPHAROPLASTY
Anesthesia: General | Laterality: Bilateral

## 2022-02-16 MED ORDER — OXYCODONE HCL 5 MG PO TABS: 5.0000 mg | ORAL_TABLET | Freq: Once | ORAL | Status: DC | PRN

## 2022-02-16 MED ORDER — PROPOFOL 500 MG/50ML IV EMUL
INTRAVENOUS | Status: DC | PRN
  Administered 2022-02-16: 75 ug/kg/min via INTRAVENOUS

## 2022-02-16 MED ORDER — ONDANSETRON HCL 4 MG/2ML IJ SOLN
INTRAMUSCULAR | Status: DC | PRN
  Administered 2022-02-16: 4 mg via INTRAVENOUS

## 2022-02-16 MED ORDER — BSS IO SOLN
INTRAOCULAR | Status: DC | PRN
  Administered 2022-02-16: 15 mL via INTRAOCULAR

## 2022-02-16 MED ORDER — LIDOCAINE-EPINEPHRINE 2 %-1:100000 IJ SOLN
INTRAMUSCULAR | Status: DC | PRN
  Administered 2022-02-16: 2 mL via OPHTHALMIC

## 2022-02-16 MED ORDER — LACTATED RINGERS IV SOLN: INTRAVENOUS | Status: DC

## 2022-02-16 MED ORDER — ERYTHROMYCIN 5 MG/GM OP OINT
TOPICAL_OINTMENT | OPHTHALMIC | Status: DC | PRN
  Administered 2022-02-16 (×2): 1 via OPHTHALMIC

## 2022-02-16 MED ORDER — FENTANYL CITRATE PF 50 MCG/ML IJ SOSY: 25.0000 ug | PREFILLED_SYRINGE | INTRAMUSCULAR | Status: DC | PRN

## 2022-02-16 MED ORDER — FENTANYL CITRATE (PF) 100 MCG/2ML IJ SOLN
INTRAMUSCULAR | Status: DC | PRN
  Administered 2022-02-16: 100 ug via INTRAVENOUS

## 2022-02-16 MED ORDER — GLYCOPYRROLATE PF 0.2 MG/ML IJ SOSY
PREFILLED_SYRINGE | INTRAMUSCULAR | Status: DC | PRN
  Administered 2022-02-16: .1 mg via INTRAVENOUS

## 2022-02-16 MED ORDER — OXYCODONE HCL 5 MG/5ML PO SOLN: 5.0000 mg | Freq: Once | ORAL | Status: DC | PRN

## 2022-02-16 MED ORDER — TETRACAINE HCL 0.5 % OP SOLN
OPHTHALMIC | Status: DC | PRN
  Administered 2022-02-16: 2 [drp] via OPHTHALMIC

## 2022-02-16 MED ORDER — ERYTHROMYCIN 5 MG/GM OP OINT: TOPICAL_OINTMENT | OPHTHALMIC | 2 refills | Status: DC

## 2022-02-16 MED ORDER — TRAMADOL HCL 50 MG PO TABS: ORAL_TABLET | ORAL | 0 refills | Status: DC

## 2022-02-16 MED ORDER — ONDANSETRON HCL 4 MG/2ML IJ SOLN: 4.0000 mg | Freq: Once | INTRAMUSCULAR | Status: DC | PRN

## 2022-02-16 SURGICAL SUPPLY — 36 items
APPLICATOR COTTON TIP WD 3 STR (MISCELLANEOUS) ×2 IMPLANT
BLADE SURG 15 STRL LF DISP TIS (BLADE) ×2 IMPLANT
BLADE SURG 15 STRL SS (BLADE) ×1
CORD BIP STRL DISP 12FT (MISCELLANEOUS) ×2 IMPLANT
GAUZE SPONGE 4X4 12PLY STRL (GAUZE/BANDAGES/DRESSINGS) ×2 IMPLANT
GLOVE SURG UNDER POLY LF SZ7 (GLOVE) ×4 IMPLANT
GOWN STRL REUS W/ TWL LRG LVL3 (GOWN DISPOSABLE) ×2 IMPLANT
GOWN STRL REUS W/TWL LRG LVL3 (GOWN DISPOSABLE) ×1
MARKER SKIN XFINE TIP W/RULER (MISCELLANEOUS) ×2 IMPLANT
NDL FILTER BLUNT 18X1 1/2 (NEEDLE) ×2 IMPLANT
NDL HYPO 30X.5 LL (NEEDLE) ×4 IMPLANT
NEEDLE FILTER BLUNT 18X 1/2SAF (NEEDLE) ×1
NEEDLE FILTER BLUNT 18X1 1/2 (NEEDLE) ×1 IMPLANT
NEEDLE HYPO 30X.5 LL (NEEDLE) ×2 IMPLANT
PACK ENT CUSTOM (PACKS) ×2 IMPLANT
SOL PREP PVP 2OZ (MISCELLANEOUS) ×1
SOLUTION PREP PVP 2OZ (MISCELLANEOUS) ×2 IMPLANT
SPONGE GAUZE 2X2 8PLY STRL LF (GAUZE/BANDAGES/DRESSINGS) ×20 IMPLANT
SUT CHROMIC 4-0 (SUTURE)
SUT CHROMIC 4-0 M2 12X2 ARM (SUTURE)
SUT CHROMIC 5 0 P 3 (SUTURE) IMPLANT
SUT ETHILON 4 0 CL P 3 (SUTURE) IMPLANT
SUT GUT PLAIN 6-0 1X18 ABS (SUTURE) ×2 IMPLANT
SUT MERSILENE 4-0 S-2 (SUTURE) IMPLANT
SUT PROLENE 5 0 P 3 (SUTURE) IMPLANT
SUT PROLENE 6 0 P 1 18 (SUTURE) IMPLANT
SUT SILK 4 0 G 3 (SUTURE) IMPLANT
SUT VIC AB 5-0 P-3 18X BRD (SUTURE) IMPLANT
SUT VIC AB 5-0 P3 18 (SUTURE)
SUT VICRYL 6-0  S14 CTD (SUTURE)
SUT VICRYL 6-0 S14 CTD (SUTURE) IMPLANT
SUT VICRYL 7 0 TG140 8 (SUTURE) IMPLANT
SUTURE CHRMC 4-0 M2 12X2 ARM (SUTURE) IMPLANT
SYR 10ML LL (SYRINGE) ×2 IMPLANT
SYR 3ML LL SCALE MARK (SYRINGE) ×2 IMPLANT
WATER STERILE IRR 250ML POUR (IV SOLUTION) ×2 IMPLANT

## 2022-02-16 NOTE — Transfer of Care (Signed)
Immediate Anesthesia Transfer of Care Note  Patient: Amy Herman  Procedure(s) Performed: BLEPHAROPLASTY UPPER EYELID; W/EXCESS SKIN BILATERAL BLEPHAROPTOSIS REPAIR; RESECT EX BILATERAL (Bilateral)  Patient Location: PACU  Anesthesia Type: General  Level of Consciousness: awake, alert  and patient cooperative  Airway and Oxygen Therapy: Patient Spontanous Breathing and Patient connected to supplemental oxygen  Post-op Assessment: Post-op Vital signs reviewed, Patient's Cardiovascular Status Stable, Respiratory Function Stable, Patent Airway and No signs of Nausea or vomiting  Post-op Vital Signs: Reviewed and stable  Complications: There were no known notable events for this encounter.

## 2022-02-16 NOTE — Anesthesia Preprocedure Evaluation (Signed)
Anesthesia Evaluation  Patient identified by MRN, date of birth, ID band Patient awake  General Assessment Comment:  Patient with one day of mild sinus symptoms - scant yellow-tinged drainage, otherwise mostly clear. No respiratory symptoms, no fevers, no cough. Patient informed about potential increased respiratory risk with anesthesia, patient understands. I believe it is reasonable to proceed today  Reviewed: Allergy & Precautions, NPO status , Patient's Chart, lab work & pertinent test results  History of Anesthesia Complications (+) PONV and history of anesthetic complications  Airway Mallampati: II  TM Distance: >3 FB Neck ROM: Full    Dental  (+) Partial Upper   Pulmonary neg pulmonary ROS, neg sleep apnea, neg COPD, Patient abstained from smoking.Not current smoker,    Pulmonary exam normal breath sounds clear to auscultation       Cardiovascular Exercise Tolerance: Good METShypertension, Pt. on medications (-) CAD and (-) Past MI (-) dysrhythmias  Rhythm:Regular Rate:Normal - Systolic murmurs    Neuro/Psych negative neurological ROS  negative psych ROS   GI/Hepatic GERD  Medicated and Controlled,(+)     (-) substance abuse  ,   Endo/Other  neg diabetes  Renal/GU negative Renal ROS     Musculoskeletal  (+) Arthritis ,   Abdominal   Peds  Hematology   Anesthesia Other Findings Past Medical History: No date: Allergic rhinitis No date: Allergy No date: Bronchitis     Comment:  more frequent in the past No date: Cataract     Comment:  "suspect per pt" No date: Colon polyps     Comment:  history of No date: GERD (gastroesophageal reflux disease) No date: Glaucoma     Comment:  suspected No date: Heart murmur No date: History of kidney stones     Comment:  suspects that she had one and past it No date: Hyperlipidemia No date: Hypertension No date: Osteoarthritis of knee No date: Osteopenia No  date: PONV (postoperative nausea and vomiting)     Comment:  with tubal ligation years ago  Reproductive/Obstetrics                             Anesthesia Physical  Anesthesia Plan  ASA: 2  Anesthesia Plan: General   Post-op Pain Management: Minimal or no pain anticipated   Induction: Intravenous  PONV Risk Score and Plan: 3 and Propofol infusion, TIVA and Ondansetron  Airway Management Planned: Nasal Cannula  Additional Equipment: None  Intra-op Plan:   Post-operative Plan:   Informed Consent: I have reviewed the patients History and Physical, chart, labs and discussed the procedure including the risks, benefits and alternatives for the proposed anesthesia with the patient or authorized representative who has indicated his/her understanding and acceptance.     Dental advisory given  Plan Discussed with: CRNA and Surgeon  Anesthesia Plan Comments: (Discussed risks of anesthesia with patient, including possibility of difficulty with spontaneous ventilation under anesthesia necessitating airway intervention, PONV, and rare risks such as cardiac or respiratory or neurological events, and allergic reactions. Discussed the role of CRNA in patient's perioperative care. Patient understands.)        Anesthesia Quick Evaluation

## 2022-02-16 NOTE — Op Note (Signed)
Preoperative Diagnosis:  1. Visually significant blepharoptosis bilateral  Upper Eyelid(s) 2. Visually significant dermatochalasis bilateral  Upper Eyelid(s)  Postoperative Diagnosis:  Same.  Procedure(s) Performed:   1. Blepharoptosis repair with levator aponeurosis advancement bilateral  Upper Eyelid(s) 2. Upper eyelid blepharoplasty with excess skin excision  bilateral  Upper Eyelid(s)  Surgeon: Philis Pique. Vickki Muff, M.D.  Assistants: none  Anesthesia: MAC  Specimens: None.  Estimated Blood Loss: Minimal.  Complications: None.  Operative Findings: None Dictated  Procedure:   Allergies were reviewed and the patient Timoptic [timolol], Alphagan [brimonidine], Cefuroxime axetil, Cefuroxime axetil, Clarithromycin, Levofloxacin, Lipitor [atorvastatin], Losartan, Other, Rosuvastatin, and Valsartan.   After the risks, benefits, complications and alternatives were discussed with the patient, appropriate informed consent was obtained.  While seated in an upright position and looking in primary gaze, the mid pupillary line was marked on the upper eyelid margins bilaterally. The patient was then brought to the operating suite and reclined supine.  Timeout was conducted and the patient was sedated.  Local anesthetic consisting of a 50-50 mixture of 2% lidocaine with epinephrine and 0.75% bupivacaine with added Hylenex was injected subcutaneously to both  upper eyelid(s). After adequate local was instilled, the patient was prepped and draped in the usual sterile fashion for eyelid surgery.   Attention was turned to the upper eyelids. A 4m upper eyelid crease incision line was marked with calipers on both  upper eyelid(s).  A pinch test was used to estimate the amount of excess skin to remove and this was marked in standard blepharoplasty style fashion. Attention was turned to the  right  upper eyelid. A #15 blade was used to open the premarked incision line. A Skin and muscle flap was excised and  hemostasis was obtained with bipolar cautery. A buttonhole was created medially in orbicularis and orbital septum to reveal the medial fat pocket. This was dissected free from fascial attachments, cauterized towards the pedicle base and excised to produce a nice flattening of the medial corner of the upper eyelid.  Westcott scissors were then used to transect through orbicularis down to the tarsal plate. Epitarsus was dissected to create a smooth surface to suture to. Dissection was then carried superiorly in the plane between orbicularis and orbital septum. Once the preaponeurotic fat pocket was identified, the orbital septum was opened. This revealed the levator and its aponeurosis.    Attention was then turned to the opposite eyelid where the same procedure was performed in the same manner. Hemostasis was obtained with bipolar cautery throughout.   3 interrupted 6-0 Prolene sutures were then passed partial thickness through the tarsal plates of both  upper eyelid(s). These sutures were placed in line with the mid pupillary, medial limbal, and lateral limbal lines. The sutures were fixed to the levator aponeurosis and adjusted until a nice lid height and contour were achieved. Once nice symmetry was achieved, the skin incisions were closed with a running 6-0 plain gut suture. The patient tolerated the procedure well.  Erythromycin ophthalmic ophthalmic ointment was applied to the incision site(s) followed by ice packs. The patient was taken to the recovery area where she recovered without difficulty.  Post-Op Plan/Instructions:   The patient was instructed to use ice packs frequently for the next 48 hours. She was instructed to use Erythromycin ophthalmic ophthalmic ointment on her incisions 4 times a day for the next 12 to 14 days. Shewas given a prescription for tramadol (or similar) for pain control should Tylenol not be effective. She was asked  to to follow up at the [facility] in [place], [place]  in 2-3 weeks' time or sooner as needed for problems.  Westlee Devita M. Vickki Muff, M.D. Ophthalmology

## 2022-02-16 NOTE — Interval H&P Note (Signed)
History and Physical Interval Note:  02/16/2022 10:47 AM  Amy Herman  has presented today for surgery, with the diagnosis of H02.831 Dermatochalasis of Right Upper Eyelid  H02.834 Dermatochalasis of Left Upper Eyelid H02.403 Ptosis of Eyelid, Unspecified, Bilateral.  The various methods of treatment have been discussed with the patient and family. After consideration of risks, benefits and other options for treatment, the patient has consented to  Procedure(s): BLEPHAROPLASTY UPPER EYELID; W/EXCESS SKIN BILATERAL BLEPHAROPTOSIS REPAIR; RESECT EX BILATERAL (Bilateral) as a surgical intervention.  The patient's history has been reviewed, patient examined, no change in status, stable for surgery.  I have reviewed the patient's chart and labs.  Questions were answered to the patient's satisfaction.     Vickki Muff, [name]

## 2022-02-16 NOTE — H&P (Signed)
[place]: [facility]  Primary Care Physician:  Tower, Wynelle Fanny, MD Ophthalmologist: Dr. Philis Pique. Vickki Muff, M.D.  Pre-Procedure History & Physical: HPI:  Amy Herman is a 76 y.o. female here for periocular surgery.   Past Medical History:  Diagnosis Date   Allergic rhinitis    Allergy    Bronchitis    more frequent in the past   Cataract    "suspect per pt"   Colon polyps    history of   GERD (gastroesophageal reflux disease)    Glaucoma    suspected   Heart murmur    History of kidney stones    suspects that she had one and past it   Hyperlipidemia    Hypertension    Orthodontics    Invisalign   Osteoarthritis of knee    Osteopenia    PONV (postoperative nausea and vomiting)    with tubal ligation years ago    Past Surgical History:  Procedure Laterality Date   BUNIONECTOMY     bilateral-PIN LEFT GREAT TOE   CARPAL TUNNEL RELEASE     COLONOSCOPY     COLONOSCOPY W/ POLYPECTOMY     FUNCTIONAL ENDOSCOPIC SINUS SURGERY     05-04-2011   HYSTERECTOMY     only removed uterus   KNEE ARTHROPLASTY Right 12/12/2021   Procedure: COMPUTER ASSISTED TOTAL KNEE ARTHROPLASTY;  Surgeon: Dereck Leep, MD;  Location: [facility] ORS;  Service: Orthopedics;  Laterality: Right;   KNEE ARTHROSCOPY     right   TUBAL LIGATION     bilateral   VEIN SURGERY  2006   legs    Prior to Admission medications   Medication Sig Start Date End Date Taking? Authorizing Provider  acetaminophen (TYLENOL) 500 MG tablet Take 500 mg by mouth every 8 (eight) hours as needed for moderate pain.   Yes [provider]  albuterol (VENTOLIN HFA) 108 (90 Base) MCG/ACT inhaler Inhale 1 puff into the lungs every 6 (six) hours as needed for wheezing or shortness of breath.   Yes [provider]  ascorbic acid (VITAMIN C) 500 MG tablet Take 500 mg by mouth daily.   Yes [provider]  ASPIRIN 81 PO Take by mouth daily.   Yes [provider]  bimatoprost (LUMIGAN)  0.01 % SOLN Place 1 drop into both eyes at bedtime. 08/04/13  Yes [provider]  calcium citrate-vitamin D (CITRACAL+D) 315-200 MG-UNIT per tablet Take 1 tablet by mouth daily.   Yes [provider]  cetirizine (ZYRTEC) 10 MG tablet Take 10 mg by mouth daily. 01/21/20  Yes [provider]  Cholecalciferol (VITAMIN D3) 125 MCG (5000 UT) CAPS Take 1,000 Units by mouth daily.   Yes [provider]  EPINEPHrine 0.3 mg/0.3 mL IJ SOAJ injection Inject 0.3 mg into the muscle as needed for anaphylaxis. 02/03/20  Yes [provider]  fluticasone (FLONASE) 50 MCG/ACT nasal spray Place 2 sprays into both nostrils as needed. 01/21/20  Yes [provider]  Garlic 0623 MG CAPS Take 1,000 mg by mouth daily.   Yes [provider]  montelukast (SINGULAIR) 10 MG tablet Take 10 mg by mouth at bedtime as needed (allergies).   Yes [provider]  Omega 3 1000 MG CAPS Take 1,000 mg by mouth daily.   Yes [provider]  omeprazole (PRILOSEC) 20 MG capsule Take 20 mg by mouth daily as needed (acid reflux).   Yes [provider]  potassium chloride (KLOR-CON) 10  MEQ tablet Take 2 tablets (20 mEq  total) by mouth daily. 12/16/21  Yes Tower, Wynelle Fanny, MD  Sodium Chloride-Sodium Bicarb (SINUS [place] NETI POT NA) Place 1 application. into the nose as needed.   Yes [provider]  triamterene-hydrochlorothiazide (MAXZIDE-25) 37.5-25 MG tablet TAKE 1 TABLET BY MOUTH DAILY 11/14/21  Yes Tower, Wynelle Fanny, MD    Allergies as of 12/14/2021 - Review Complete 12/12/2021  Allergen Reaction Noted   Cefuroxime axetil     Cefuroxime axetil Other (See Comments) 04/02/2013   Clarithromycin Other (See Comments) 04/02/2013   Levofloxacin Other (See Comments) 08/15/2011   Lipitor [atorvastatin] Other (See Comments) 02/16/2016   Losartan Other (See Comments) 07/19/2012   Other  05/13/2015   Rosuvastatin Other (See Comments) 08/13/2009   Valsartan   07/23/2017    Family History  Problem Relation Age of Onset   Cancer Mother        colon, lungs, liver   Colon cancer Mother 80   Cancer Father        prostate   Heart disease Father        A-fib   Stroke Father        TIA's   Alcohol abuse Brother    Cancer Brother        pancreatic, smoker   Diabetes Brother    Diabetes Brother    Stomach cancer Neg Hx    Esophageal cancer Neg Hx    Rectal cancer Neg Hx    Breast cancer Neg Hx     Social History   Socioeconomic History   Marital status: Widowed    Spouse name: Not on file   Number of children: Not on file   Years of education: Not on file   Highest education level: Not on file  Occupational History   Occupation: [employer]    Comment: over 30 years  Tobacco Use   Smoking status: Never   Smokeless tobacco: Never   Tobacco comments:    used to have secondary smoke exposure from husband  Vaping Use   Vaping Use: Never used  Substance and Sexual Activity   Alcohol use: No    Alcohol/week: 0.0 standard drinks of alcohol   Drug use: No   Sexual activity: Never    Birth control/protection: Post-menopausal  Other Topics Concern   Not on file  Social History Narrative   Retired from [employer] in 2014.    Works for [employer] alone   Works out    Right handed   Caffeine: none   Social Determinants of Radio broadcast assistant Strain: Low Risk  (08/03/2020)   Overall Financial Resource Strain (CARDIA)    Difficulty of Paying Living Expenses: Not hard at all  Food Insecurity: No Food Insecurity (08/03/2020)   Hunger Vital Sign    Worried About Running Out of Food in the Last Year: Never true    [place] in the Last Year: Never true  Transportation Needs: No Transportation Needs (08/03/2020)   PRAPARE - Hydrologist (Medical): No    Lack of Transportation (Non-Medical): No  Physical Activity: Sufficiently Active (08/03/2020)   Exercise Vital Sign    Days of Exercise per Week: 5  days    Minutes of Exercise per Session: 60 min  Stress: No Stress Concern Present (08/03/2020)   [place]    Feeling of Stress : Not at all  Social Connections: Not on file  Intimate Partner Violence: Not At Risk (08/03/2020)   Humiliation, Afraid, Rape, and Kick questionnaire    Fear of Current or Ex-Partner: No    Emotionally Abused: No    Physically Abused: No    Sexually Abused: No    Review of Systems: See HPI, otherwise negative ROS  Physical Exam: BP 137/72   Pulse 65   Temp 97.7 F (36.5 C) (Temporal)   Resp 18   Ht '5\' 2"'$  (1.575 m)   Wt 62.1 kg   SpO2 100%   BMI 25.06 kg/m  General:   Alert and cooperative in NAD Head:  Normocephalic and atraumatic. Respiratory:  Normal work of breathing.  Impression/Plan: Amy Herman is here for periocular surgery.  Risks, benefits, limitations, and alternatives regarding surgery have been reviewed with the patient.  Questions have been answered.  All parties agreeable.   Karle Starch, MD  02/16/2022, 10:47 AM

## 2022-02-16 NOTE — Anesthesia Postprocedure Evaluation (Signed)
Anesthesia Post Note  Patient: Amy Herman  Procedure(s) Performed: BLEPHAROPLASTY UPPER EYELID; W/EXCESS SKIN BILATERAL BLEPHAROPTOSIS REPAIR; RESECT EX BILATERAL (Bilateral)     Patient location during evaluation: PACU Anesthesia Type: General Level of consciousness: awake and alert Pain management: pain level controlled Vital Signs Assessment: post-procedure vital signs reviewed and stable Respiratory status: spontaneous breathing, nonlabored ventilation, respiratory function stable and patient connected to nasal cannula oxygen Cardiovascular status: stable and blood pressure returned to baseline Postop Assessment: no apparent nausea or vomiting Anesthetic complications: no Comments: Occasional PVCs, asymptomatic, hemodynamically stable. Good for discharge   There were no known notable events for this encounter.  Arita Miss

## 2022-02-17 ENCOUNTER — Encounter: Payer: Self-pay | Admitting: Ophthalmology

## 2022-02-24 DIAGNOSIS — J3089 Other allergic rhinitis: Secondary | ICD-10-CM | POA: Diagnosis not present

## 2022-03-02 DIAGNOSIS — J3089 Other allergic rhinitis: Secondary | ICD-10-CM | POA: Diagnosis not present

## 2022-03-10 DIAGNOSIS — J3089 Other allergic rhinitis: Secondary | ICD-10-CM | POA: Diagnosis not present

## 2022-03-13 ENCOUNTER — Telehealth: Payer: Self-pay | Admitting: Cardiovascular Disease

## 2022-03-13 DIAGNOSIS — E782 Mixed hyperlipidemia: Secondary | ICD-10-CM

## 2022-03-13 DIAGNOSIS — Z8249 Family history of ischemic heart disease and other diseases of the circulatory system: Secondary | ICD-10-CM

## 2022-03-13 NOTE — Telephone Encounter (Signed)
Patient is calling because she would like to have her CT scan done now.  I did not see an order for it.

## 2022-03-13 NOTE — Telephone Encounter (Signed)
Reviewed the patient's chart.  It looks like she may be referring to a CT Calcium Score.   Attempted to call the patient to clarify. No answer- Voice mail box is full.  Will attempt to call back at a later time.     CT Coronary Calcium Score:  Your physician has recommended that you have CT Coronary Calcium Score.  - $99 out of pocket cost at the time of your test - Call [phone] to schedule at your convenience.  Location: [facility] [address] [place], [place] [address]

## 2022-03-15 NOTE — Telephone Encounter (Signed)
Attempted to call the patient. No answer- I left a message to please call back.  

## 2022-03-17 DIAGNOSIS — J3089 Other allergic rhinitis: Secondary | ICD-10-CM | POA: Diagnosis not present

## 2022-03-17 DIAGNOSIS — J301 Allergic rhinitis due to pollen: Secondary | ICD-10-CM | POA: Diagnosis not present

## 2022-03-17 DIAGNOSIS — J3081 Allergic rhinitis due to animal (cat) (dog) hair and dander: Secondary | ICD-10-CM | POA: Diagnosis not present

## 2022-03-17 NOTE — Telephone Encounter (Signed)
I left message on patients cell [phone] to call [phone] to schedule   her cardiac calcium score.

## 2022-03-20 NOTE — Addendum Note (Signed)
Addended by: Alvis Lemmings C on: 03/20/2022 12:38 PM   Modules accepted: Orders

## 2022-03-20 NOTE — Telephone Encounter (Signed)
Secure chat received from [facility], [place] [facility], that the patient had reached out to schedule her CT Calcium score, but no order in.  Order placed.   CT notified.

## 2022-03-21 DIAGNOSIS — J3089 Other allergic rhinitis: Secondary | ICD-10-CM | POA: Diagnosis not present

## 2022-03-22 ENCOUNTER — Ambulatory Visit
Admission: RE | Admit: 2022-03-22 | Discharge: 2022-03-22 | Disposition: A | Discharge status: Home / Self Care | Payer: Medicare Other | Source: Ambulatory Visit | Attending: Cardiovascular Disease | Admitting: Cardiovascular Disease

## 2022-03-22 DIAGNOSIS — E782 Mixed hyperlipidemia: Secondary | ICD-10-CM | POA: Insufficient documentation

## 2022-03-22 DIAGNOSIS — Z8249 Family history of ischemic heart disease and other diseases of the circulatory system: Secondary | ICD-10-CM | POA: Insufficient documentation

## 2022-03-23 ENCOUNTER — Telehealth: Payer: Self-pay | Admitting: Cardiovascular Disease

## 2022-03-23 NOTE — Telephone Encounter (Signed)
Pt would like a callback once CT results are in. Please advise

## 2022-03-24 ENCOUNTER — Ambulatory Visit (INDEPENDENT_AMBULATORY_CARE_PROVIDER_SITE_OTHER): Payer: Medicare Other | Admitting: Podiatry

## 2022-03-24 DIAGNOSIS — L989 Disorder of the skin and subcutaneous tissue, unspecified: Secondary | ICD-10-CM | POA: Diagnosis not present

## 2022-03-24 NOTE — Progress Notes (Signed)
   Subjective: 76 y.o. female presenting to the office today for evaluation of a symptomatic callus to the left foot.  Patient also has a history of bunion surgery with recurrent bunions bilateral.  She is very active and healthy and currently it is not inhibiting her activities.  She presents for further treatment and evaluation   Past Medical History:  Diagnosis Date   Allergic rhinitis    Allergy    Bronchitis    more frequent in the past   Cataract    "suspect per pt"   Colon polyps    history of   GERD (gastroesophageal reflux disease)    Glaucoma    suspected   Heart murmur    History of kidney stones    suspects that she had one and past it   Hyperlipidemia    Hypertension    Orthodontics    Invisalign   Osteoarthritis of knee    Osteopenia    PONV (postoperative nausea and vomiting)    with tubal ligation years ago     Objective:  Physical Exam General: Alert and oriented x3 in no acute distress  Dermatology: Hyperkeratotic lesion(s) present on the left foot. Pain on palpation with a central nucleated core noted. Skin is warm, dry and supple bilateral lower extremities. Negative for open lesions or macerations.  Vascular: Palpable pedal pulses bilaterally. No edema or erythema noted. Capillary refill within normal limits.  Neurological: Epicritic and protective threshold grossly intact bilaterally.   Musculoskeletal Exam: Pain on palpation at the keratotic lesion(s) noted. Range of motion within normal limits bilateral. Muscle strength 5/5 in all groups bilateral.  Clinical evidence of hallux limitus to the left foot with recurrence of bunion deformities to the bilateral great toes  Assessment: 1.  Porokeratosis left foot  2.  Recurrent bunion deformities with hallux limitus left hallux   Plan of Care:  1. Patient evaluated 2. Excisional debridement of keratoic lesion(s) using a chisel blade was performed without incident.  Salicylic acid applied.  Recommend  OTC corn and callus remover 3. Dressed area with light dressing. 4. Patient is to return to the clinic PRN.   *Very active.  Works at [employer] full-time.  Also retired from [employer] for 40+ years  Edrick Kins, DPM [facility]  Dr. Edrick Kins, [place] [address]                                        [place], [place] [address]                Office [phone]  Fax [phone]

## 2022-03-27 ENCOUNTER — Encounter: Payer: Self-pay | Admitting: Family Medicine

## 2022-03-27 DIAGNOSIS — E278 Other specified disorders of adrenal gland: Secondary | ICD-10-CM | POA: Insufficient documentation

## 2022-03-27 MED ORDER — EZETIMIBE 10 MG PO TABS: 10.0000 mg | ORAL_TABLET | Freq: Every day | ORAL | 3 refills | Status: DC

## 2022-03-27 NOTE — Telephone Encounter (Signed)
Left a message for the pt and sent her a My Chart message with Dr Donivan Scull notes.   I will send in her Zetia.

## 2022-03-27 NOTE — Telephone Encounter (Signed)
Patient returning call.

## 2022-03-27 NOTE — Telephone Encounter (Addendum)
I spoke with the pt re: her calcium score... she will try the Zetia... she will talk with Dr Glori Bickers... and follow up with her for her one year CT.   Everything faxed to Dr Glori Bickers.

## 2022-03-28 DIAGNOSIS — J3089 Other allergic rhinitis: Secondary | ICD-10-CM | POA: Diagnosis not present

## 2022-04-03 ENCOUNTER — Telehealth: Payer: Self-pay | Admitting: Family Medicine

## 2022-04-03 NOTE — Telephone Encounter (Signed)
Patient called in and stated that she had a question for Dr. Glori Bickers. She stated she is on ezetimibe (ZETIA) 10 MG tablet and was wanting to know if she can take it with other OTC medications (vitamin D, garlic, etc.). Please advise. Thank you!

## 2022-04-03 NOTE — Telephone Encounter (Signed)
Yes- good question

## 2022-04-04 NOTE — Telephone Encounter (Signed)
Pt.notified

## 2022-04-06 ENCOUNTER — Ambulatory Visit: Payer: Medicare Other

## 2022-04-06 DIAGNOSIS — J3081 Allergic rhinitis due to animal (cat) (dog) hair and dander: Secondary | ICD-10-CM | POA: Diagnosis not present

## 2022-04-06 DIAGNOSIS — J3089 Other allergic rhinitis: Secondary | ICD-10-CM | POA: Diagnosis not present

## 2022-04-06 DIAGNOSIS — J301 Allergic rhinitis due to pollen: Secondary | ICD-10-CM | POA: Diagnosis not present

## 2022-04-11 ENCOUNTER — Ambulatory Visit (INDEPENDENT_AMBULATORY_CARE_PROVIDER_SITE_OTHER): Payer: Medicare Other | Admitting: Family Medicine

## 2022-04-11 ENCOUNTER — Encounter: Payer: Self-pay | Admitting: Family Medicine

## 2022-04-11 VITALS — BP 122/74 | HR 91 | Temp 97.8°F | Ht 60.0 in | Wt 135.2 lb

## 2022-04-11 DIAGNOSIS — Z1211 Encounter for screening for malignant neoplasm of colon: Secondary | ICD-10-CM | POA: Diagnosis not present

## 2022-04-11 DIAGNOSIS — Z0000 Encounter for general adult medical examination without abnormal findings: Secondary | ICD-10-CM | POA: Diagnosis not present

## 2022-04-11 DIAGNOSIS — I10 Essential (primary) hypertension: Secondary | ICD-10-CM | POA: Diagnosis not present

## 2022-04-11 DIAGNOSIS — G720 Drug-induced myopathy: Secondary | ICD-10-CM

## 2022-04-11 DIAGNOSIS — M85859 Other specified disorders of bone density and structure, unspecified thigh: Secondary | ICD-10-CM | POA: Diagnosis not present

## 2022-04-11 DIAGNOSIS — E2839 Other primary ovarian failure: Secondary | ICD-10-CM

## 2022-04-11 DIAGNOSIS — Z23 Encounter for immunization: Secondary | ICD-10-CM

## 2022-04-11 DIAGNOSIS — J3081 Allergic rhinitis due to animal (cat) (dog) hair and dander: Secondary | ICD-10-CM | POA: Diagnosis not present

## 2022-04-11 DIAGNOSIS — Z86010 Personal history of colonic polyps: Secondary | ICD-10-CM

## 2022-04-11 DIAGNOSIS — E782 Mixed hyperlipidemia: Secondary | ICD-10-CM

## 2022-04-11 DIAGNOSIS — T466X5A Adverse effect of antihyperlipidemic and antiarteriosclerotic drugs, initial encounter: Secondary | ICD-10-CM

## 2022-04-11 DIAGNOSIS — J3089 Other allergic rhinitis: Secondary | ICD-10-CM | POA: Diagnosis not present

## 2022-04-11 DIAGNOSIS — J301 Allergic rhinitis due to pollen: Secondary | ICD-10-CM | POA: Diagnosis not present

## 2022-04-11 LAB — CBC WITH DIFFERENTIAL/PLATELET
Basophils Absolute: 0.1 10*3/uL (ref 0.0–0.1)
Basophils Relative: 3.2 % — ABNORMAL HIGH (ref 0.0–3.0)
Eosinophils Absolute: 0.2 10*3/uL (ref 0.0–0.7)
Eosinophils Relative: 3.3 % (ref 0.0–5.0)
HCT: 39.1 % (ref 36.0–46.0)
Hemoglobin: 13.3 g/dL (ref 12.0–15.0)
Lymphocytes Relative: 27.3 % (ref 12.0–46.0)
Lymphs Abs: 1.3 10*3/uL (ref 0.7–4.0)
MCHC: 34 g/dL (ref 30.0–36.0)
MCV: 87.9 fl (ref 78.0–100.0)
Monocytes Absolute: 0.4 10*3/uL (ref 0.1–1.0)
Monocytes Relative: 8.9 % (ref 3.0–12.0)
Neutro Abs: 2.6 10*3/uL (ref 1.4–7.7)
Neutrophils Relative %: 57.3 % (ref 43.0–77.0)
Platelets: 313 10*3/uL (ref 150.0–400.0)
RBC: 4.45 Mil/uL (ref 3.87–5.11)
RDW: 12.5 % (ref 11.5–15.5)
WBC: 4.6 10*3/uL (ref 4.0–10.5)

## 2022-04-11 LAB — COMPREHENSIVE METABOLIC PANEL
ALT: 12 U/L (ref 0–35)
AST: 19 U/L (ref 0–37)
Albumin: 4 g/dL (ref 3.5–5.2)
Alkaline Phosphatase: 109 U/L (ref 39–117)
BUN: 17 mg/dL (ref 6–23)
CO2: 28 mEq/L (ref 19–32)
Calcium: 9.6 mg/dL (ref 8.4–10.5)
Chloride: 103 mEq/L (ref 96–112)
Creatinine, Ser: 0.84 mg/dL (ref 0.40–1.20)
GFR: 67.15 mL/min (ref >60.00)
Glucose, Bld: 91 mg/dL (ref 70–99)
Potassium: 3.2 mEq/L — ABNORMAL LOW (ref 3.5–5.1)
Sodium: 140 mEq/L (ref 135–145)
Total Bilirubin: 0.5 mg/dL (ref 0.2–1.2)
Total Protein: 7 g/dL (ref 6.0–8.3)

## 2022-04-11 LAB — LIPID PANEL
Cholesterol: 179 mg/dL (ref 0–200)
HDL: 58.2 mg/dL (ref >39.00)
LDL Cholesterol: 105 mg/dL — ABNORMAL HIGH (ref 0–99)
NonHDL: 120.4
Total CHOL/HDL Ratio: 3
Triglycerides: 75 mg/dL (ref 0.0–149.0)
VLDL: 15 mg/dL (ref 0.0–40.0)

## 2022-04-11 MED ORDER — TRIAMTERENE-HCTZ 37.5-25 MG PO TABS
1.0000 | ORAL_TABLET | Freq: Every day | ORAL | 3 refills | Status: DC
Start: 2022-04-11 — End: 2022-04-11

## 2022-04-11 MED ORDER — TRIAMTERENE-HCTZ 37.5-25 MG PO TABS
1.0000 | ORAL_TABLET | Freq: Every day | ORAL | 3 refills | Status: DC
Start: 2022-04-11 — End: 2023-04-27

## 2022-04-11 MED ORDER — POTASSIUM CHLORIDE ER 10 MEQ PO TBCR: EXTENDED_RELEASE_TABLET | ORAL | 3 refills | Status: DC

## 2022-04-11 NOTE — Progress Notes (Signed)
Subjective:    Patient ID: Amy Herman, female    DOB: [birth year], 76 y.o.   MRN: [id number]  HPI Here for health maintenance exam and to review chronic medical problems    Wt Readings from Last 3 Encounters:  04/11/22 135 lb 4 oz (61.3 kg)  02/16/22 137 lb (62.1 kg)  12/12/21 134 lb (60.8 kg)   26.41 kg/m  Feeling really good   Knee replacement in June and it went great! 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    Immunization History  Administered Date(s) Administered   Fluad Quad(high Dose 65+) 04/10/2019, 04/24/2020   Influenza Split 06/08/2011   Influenza Whole 05/03/2009   Influenza, High Dose Seasonal PF 08/05/2013, 04/19/2021   Influenza,inj,Quad PF,6+ Mos 04/10/2013, 06/03/2014, 07/29/2015, 05/08/2016, 03/29/2018   Influenza-Unspecified 04/16/2017   Moderna Sars-Covid-2 Vaccination 08/16/2019, 09/13/2019, 06/29/2020   PFIZER(Purple Top)SARS-COV-2 Vaccination 12/10/2020   Pneumococcal Conjugate-13 08/12/2015   Pneumococcal Polysaccharide-23 07/03/2004, 01/31/2011, 08/05/2013, 11/18/2015, 11/09/2016, 11/08/2017, 12/11/2019, 02/10/2021   Td 07/23/2006   Tdap 07/24/2017   Zoster, Live 06/13/2007   Health Maintenance Due  Topic Date Due   Zoster Vaccines- Shingrix (1 of 2) Never done   COVID-19 Vaccine (5 - Moderna series) 02/04/2021   INFLUENZA VACCINE  01/31/2022   Flu : today   Shingrix: interested   Colonoscopy 07/2017 with 5 y recall  Mammogram  12/2021  No lumps on self exam   Dexa 08/2016 -h/o osteopenia  Wants to schedule it   Falls: none Fractures: none  Supplements : ca and D  Exercise : regular / very good   HTN bp is stable today  No cp or palpitations or headaches or edema  No side effects to medicines  BP Readings from Last 3 Encounters:  04/11/22 122/74  02/16/22 139/71  12/13/21 (!) 104/58     Triam/hctz 37.5-25 mg daily  GERD Omeprazole 20  mg only prn   Hyperlipidemia Due for labs  Lab Results  Component Value Date   CHOL 249 (H) 08/02/2020   HDL 61.90 08/02/2020   LDLCALC 174 (H) 08/02/2020   TRIG 67.0 08/02/2020   CHOLHDL 4 08/02/2020   On zetia - giving her diarrhea and stomach cramping (only on it for 10 days)    Had coronary ca score 174 mild elevation   Noted adrenal gland nodule on CT needs 1 y recall  Lab Results  Component Value Date   CREATININE 0.79 11/29/2021   BUN 19 11/29/2021   NA 139 11/29/2021   K 3.5 11/29/2021   CL 101 11/29/2021   CO2 27 11/29/2021   Lab Results  Component Value Date   ALT 19 11/29/2021   AST 24 11/29/2021   ALKPHOS 93 11/29/2021   BILITOT 0.7 11/29/2021   Lab Results  Component Value Date   TSH 1.60 08/02/2020   Patient Active Problem List   Diagnosis Date Noted   Routine general medical examination at a health care facility 04/11/2022   Colon cancer screening 04/11/2022   Adrenal mass ([place]) 03/27/2022   Total knee replacement status 12/12/2021   Allergic rhinitis due to animal (cat) (dog) hair and dander 12/11/2021   Allergic rhinitis 09/16/2020   Allergic rhinitis due to pollen 08/15/2020   Statin myopathy 08/10/2020   Medicare annual wellness visit, subsequent 05/09/2019   Medicare annual wellness visit, initial 03/29/2018   Left knee pain  12/13/2017   Primary osteoarthritis of left knee 12/13/2017   History of UTI 11/11/2017   Encounter for screening mammogram for breast cancer 05/08/2016   Estrogen deficiency 02/16/2016   Pre-operative cardiovascular examination 07/21/2013   GERD (gastroesophageal reflux disease) 06/18/2013   Varicose veins of lower extremities with other complications 26/83/4196   Lumbar disc disease 08/15/2011   Joint pain 08/15/2011   Osteopenia 01/31/2011   Post-menopausal 01/31/2011   Hypokalemia 01/31/2011   Hyperlipidemia 03/24/2008   ESSENTIAL HYPERTENSION, BENIGN 03/24/2008   COLONIC POLYPS, HX OF 03/24/2008   Past  Medical History:  Diagnosis Date   Allergic rhinitis    Allergy    Bronchitis    more frequent in the past   Cataract    "suspect per pt"   Colon polyps    history of   GERD (gastroesophageal reflux disease)    Glaucoma    suspected   Heart murmur    History of kidney stones    suspects that she had one and past it   Hyperlipidemia    Hypertension    Orthodontics    Invisalign   Osteoarthritis of knee    Osteopenia    PONV (postoperative nausea and vomiting)    with tubal ligation years ago   Past Surgical History:  Procedure Laterality Date   BROW LIFT Bilateral 02/16/2022   Procedure: BLEPHAROPLASTY UPPER EYELID; W/EXCESS SKIN BILATERAL BLEPHAROPTOSIS REPAIR; RESECT EX BILATERAL;  Surgeon: Karle Starch, MD;  Location: [facility];  Service: Ophthalmology;  Laterality: Bilateral;   BUNIONECTOMY     bilateral-PIN LEFT GREAT TOE   CARPAL TUNNEL RELEASE     COLONOSCOPY     COLONOSCOPY W/ POLYPECTOMY     FUNCTIONAL ENDOSCOPIC SINUS SURGERY     05-04-2011   HYSTERECTOMY     only removed uterus   KNEE ARTHROPLASTY Right 12/12/2021   Procedure: COMPUTER ASSISTED TOTAL KNEE ARTHROPLASTY;  Surgeon: Dereck Leep, MD;  Location: [facility];  Service: Orthopedics;  Laterality: Right;   KNEE ARTHROSCOPY     right   TUBAL LIGATION     bilateral   VEIN SURGERY  2006   legs   Social History   Tobacco Use   Smoking status: Never   Smokeless tobacco: Never   Tobacco comments:    used to have secondary smoke exposure from husband  Vaping Use   Vaping Use: Never used  Substance Use Topics   Alcohol use: No    Alcohol/week: 0.0 standard drinks of alcohol   Drug use: No   Family History  Problem Relation Age of Onset   Cancer Mother        colon, lungs, liver   Colon cancer Mother 64   Cancer Father        prostate   Heart disease Father        A-fib   Stroke Father        TIA's   Alcohol abuse Brother    Cancer Brother        pancreatic, smoker   Diabetes  Brother    Diabetes Brother    Stomach cancer Neg Hx    Esophageal cancer Neg Hx    Rectal cancer Neg Hx    Breast cancer Neg Hx    Allergies  Allergen Reactions   Timoptic [Timolol] Shortness Of Breath    "Mild"   Alphagan [Brimonidine] Swelling    Iritis   Cefuroxime Axetil     REACTION: bumps on tounge  Cefuroxime Axetil Other (See Comments)    TOLERATED CEFAZOLIN Other reaction(s): RASH REACTION: bumps on tounge   Clarithromycin Other (See Comments)    Other reaction(s): OTHER REACTION: GI Biaxin    Levofloxacin Other (See Comments)    Joint pains   Lipitor [Atorvastatin] Other (See Comments)    Muscle pain   Losartan Other (See Comments)    cough   Other     Other reaction(s): Unknown   Rosuvastatin Other (See Comments)    REACTION: severe muscle pain    Valsartan     Taken off from MD d/t a recall   Current Outpatient Medications on File Prior to Visit  Medication Sig Dispense Refill   acetaminophen (TYLENOL) 500 MG tablet Take 500 mg by mouth every 8 (eight) hours as needed for moderate pain.     albuterol (VENTOLIN HFA) 108 (90 Base) MCG/ACT inhaler Inhale 1 puff into the lungs every 6 (six) hours as needed for wheezing or shortness of breath.     ascorbic acid (VITAMIN C) 500 MG tablet Take 500 mg by mouth daily.     ASPIRIN 81 PO Take by mouth daily.     bimatoprost (LUMIGAN) 0.01 % SOLN Place 1 drop into both eyes at bedtime.     calcium citrate-vitamin D (CITRACAL+D) 315-200 MG-UNIT per tablet Take 1 tablet by mouth daily.     cetirizine (ZYRTEC) 10 MG tablet Take 10 mg by mouth daily.     Cholecalciferol (VITAMIN D3) 125 MCG (5000 UT) CAPS Take 1,000 Units by mouth daily.     EPINEPHrine 0.3 mg/0.3 mL IJ SOAJ injection Inject 0.3 mg into the muscle as needed for anaphylaxis.     erythromycin ophthalmic ointment Apply to sutures 4 times a day for 10-12 days.  Discontinue if allergy develops and call our office 3.5 g 2   ezetimibe (ZETIA) 10 MG tablet  Take 1 tablet (10 mg total) by mouth daily. 90 tablet 3   fluticasone (FLONASE) 50 MCG/ACT nasal spray Place 2 sprays into both nostrils as needed.     Garlic 0272 MG CAPS Take 1,000 mg by mouth daily.     montelukast (SINGULAIR) 10 MG tablet Take 10 mg by mouth at bedtime as needed (allergies).     Omega 3 1000 MG CAPS Take 1,000 mg by mouth daily.     omeprazole (PRILOSEC) 20 MG capsule Take 20 mg by mouth daily as needed (acid reflux).     Sodium Chloride-Sodium Bicarb (SINUS [place] NETI POT NA) Place 1 application. into the nose as needed.     traMADol (ULTRAM) 50 MG tablet Take 1 every 4-6 hours as needed for pain not controlled by Tylenol 6 tablet 0   No current facility-administered medications on file prior to visit.    Review of Systems  Constitutional:  Negative for activity change, appetite change, fatigue, fever and unexpected weight change.  HENT:  Negative for congestion, ear pain, rhinorrhea, sinus pressure and sore throat.   Eyes:  Negative for pain, redness and visual disturbance.  Respiratory:  Negative for cough, shortness of breath and wheezing.   Cardiovascular:  Negative for chest pain and palpitations.  Gastrointestinal:  Negative for abdominal pain, blood in stool, constipation and diarrhea.  Endocrine: Negative for polydipsia and polyuria.  Genitourinary:  Negative for dysuria, frequency and urgency.  Musculoskeletal:  Negative for arthralgias, back pain and myalgias.       Did great with R TKA  Skin:  Negative for pallor and rash.  Allergic/Immunologic: Negative for environmental allergies.  Neurological:  Negative for dizziness, syncope and headaches.  Hematological:  Negative for adenopathy. Does not bruise/bleed easily.  Psychiatric/Behavioral:  Negative for decreased concentration and dysphoric mood. The patient is not nervous/anxious.        Objective:   Physical Exam Constitutional:      General: She is not in acute distress.    Appearance: Normal  appearance. She is well-developed. She is not ill-appearing or diaphoretic.     Comments: Appears younger than stated age  HENT:     Head: Normocephalic and atraumatic.     Right Ear: Tympanic membrane, ear canal and external ear normal.     Left Ear: Tympanic membrane, ear canal and external ear normal.     Nose: Nose normal. No congestion.     Mouth/Throat:     Mouth: Mucous membranes are moist.     Pharynx: Oropharynx is clear. No posterior oropharyngeal erythema.  Eyes:     General: No scleral icterus.    Extraocular Movements: Extraocular movements intact.     Conjunctiva/sclera: Conjunctivae normal.     Pupils: Pupils are equal, round, and reactive to light.  Neck:     Thyroid: No thyromegaly.     Vascular: No carotid bruit or JVD.  Cardiovascular:     Rate and Rhythm: Normal rate and regular rhythm.     Pulses: Normal pulses.     Heart sounds: Normal heart sounds.     No gallop.  Pulmonary:     Effort: Pulmonary effort is normal. No respiratory distress.     Breath sounds: Normal breath sounds. No wheezing.     Comments: Good air exch Chest:     Chest wall: No tenderness.  Abdominal:     General: Bowel sounds are normal. There is no distension or abdominal bruit.     Palpations: Abdomen is soft. There is no mass.     Tenderness: There is no abdominal tenderness.     Hernia: No hernia is present.  Genitourinary:    Comments: Breast exam: No mass, nodules, thickening, tenderness, bulging, retraction, inflamation, nipple discharge or skin changes noted.  No axillary or clavicular LA.     Musculoskeletal:        General: No tenderness. Normal range of motion.     Cervical back: Normal range of motion and neck supple. No rigidity. No muscular tenderness.     Right lower leg: No edema.     Left lower leg: No edema.     Comments: No kyphosis   Scar from TKA on R knee well healed   Lymphadenopathy:     Cervical: No cervical adenopathy.  Skin:    General: Skin is warm and  dry.     Coloration: Skin is not pale.     Findings: No erythema or rash.     Comments: Small b9 appearing brown mole r side of mid back  Neurological:     Mental Status: She is alert. Mental status is at baseline.     Cranial Nerves: No cranial nerve deficit.     Motor: No abnormal muscle tone.     Coordination: Coordination normal.     Gait: Gait normal.     Deep Tendon Reflexes: Reflexes are normal and symmetric. Reflexes normal.  Psychiatric:        Mood and Affect: Mood normal.        Cognition and Memory: Cognition and memory normal.     Comments: Pleasant  Mentally sharp           Assessment & Plan:   Problem List Items Addressed This Visit       Cardiovascular and Mediastinum   ESSENTIAL HYPERTENSION, BENIGN    bp in fair control at this time  BP Readings from Last 1 Encounters:  04/11/22 122/74  No changes needed Most recent labs reviewed  Disc lifstyle change with low sodium diet and exercise  triam hct 37.5-25 mg daily = will continue  Lab ordered  Great exercise /commended       Relevant Medications   triamterene-hydrochlorothiazide (MAXZIDE-25) 37.5-25 MG tablet   Other Relevant Orders   Comprehensive metabolic panel   CBC with Differential/Platelet   Lipid panel   TSH     Musculoskeletal and Integument   Osteopenia    dexa ordered  Last 2018  No falls or fx  Taking ca and D Great exercise with strength training       Statin myopathy    Now trying zetia Has done lipid clinic consult         Other   Colon cancer screening    Mother had colon cancer in her 8s Pt had last colonoscopy 2019 -reviewed today No polyps on this (had them prior) No recall for this  At 5 year point may discuss other screening options      COLONIC POLYPS, HX OF    Last colonoscopy 2019 -no polyps  Has family h/o colon cancer  No recall due to age but pt may want to do other screening at that time      Estrogen deficiency   Relevant Orders   DG Bone  Density   Hyperlipidemia    Disc goals for lipids and reasons to control them Rev last labs with pt Rev low sat fat diet in detail Last LDL 174 Trying zetia-from cardiology , this is day 10  Some diarrhea-unsure if side eff If tol will continue and re check lipid in 5 wk      Relevant Medications   triamterene-hydrochlorothiazide (MAXZIDE-25) 37.5-25 MG tablet   Other Relevant Orders   Lipid panel   AST   Routine general medical examination at a health care facility - Primary    Reviewed health habits including diet and exercise and skin cancer prevention Reviewed appropriate screening tests for age  Also reviewed health mt list, fam hx and immunization status , as well as social and family history   See HPI Labs ordered  Flu shot today  Pt plans to ask about shingrix at the pharmacy  colonscopy 2019 utd, may consider other screening at 29 y Mammogram utd 12/2021 No falls or fx  dexa ordered-pt will schedule  Great exercise  Counseled on ca and D       Relevant Orders   Flu Vaccine QUAD High Dose(Fluad) (Completed)   Other Visit Diagnoses     Need for influenza vaccination       Relevant Orders   Flu Vaccine QUAD High Dose(Fluad) (Completed)

## 2022-04-11 NOTE — Assessment & Plan Note (Signed)
Disc goals for lipids and reasons to control them Rev last labs with pt Rev low sat fat diet in detail Last LDL 174 Trying zetia-from cardiology , this is day 10  Some diarrhea-unsure if side eff If tol will continue and re check lipid in 5 wk

## 2022-04-11 NOTE — Assessment & Plan Note (Signed)
Reviewed health habits including diet and exercise and skin cancer prevention Reviewed appropriate screening tests for age  Also reviewed health mt list, fam hx and immunization status , as well as social and family history   See HPI Labs ordered  Flu shot today  Pt plans to ask about shingrix at the pharmacy  colonscopy 2019 utd, may consider other screening at 7 y Mammogram utd 12/2021 No falls or fx  dexa ordered-pt will schedule  Great exercise  Counseled on ca and D

## 2022-04-11 NOTE — Assessment & Plan Note (Addendum)
Last colonoscopy 2019 -no polyps  Has family h/o colon cancer  No recall due to age but pt may want to do other screening at that time

## 2022-04-11 NOTE — Assessment & Plan Note (Signed)
dexa ordered  Last 2018  No falls or fx  Taking ca and D Great exercise with strength training

## 2022-04-11 NOTE — Assessment & Plan Note (Signed)
Mother had colon cancer in her 26s Pt had last colonoscopy 2019 -reviewed today No polyps on this (had them prior) No recall for this  At 5 year point may discuss other screening options

## 2022-04-11 NOTE — Assessment & Plan Note (Signed)
Now trying zetia Has done lipid clinic consult

## 2022-04-11 NOTE — Patient Instructions (Addendum)
If you are interested in the new shingles vaccine (Shingrix) - call your local pharmacy to check on coverage and availability  If affordable, get on a wait list at your pharmacy to get the vaccine.  Labs today   Plan cholesterol labs in about 5 weeks Let cardiology know if you don't tolerate the zetia  Avoid red meat/ fried foods/ egg yolks/ fatty breakfast meats/ butter, cheese and high fat dairy/ and shellfish    Call the [facility] to schedule a bone density test Please call the location of your choice from the menu below to schedule your Mammogram and/or Bone Density appointment.    [facility]                      Phone:  [phone], [place] [address]                                                             Services: Traditional and 3D Mammogram, [place] Bone Density                 Phone: [phone] [address], [place] [address]    Service: Bone Density ONLY   *this site does NOT perform mammograms  [place]                        Phone:  [phone] [address], [place] [address]                                            Services:  3D Mammogram and [place] at [facility]   Phone:  [phone]   [place], [place] [address]  Services: 3D Mammogram and Bone Density  [place] [facility])  Phone:  [phone]   [address] Room [place], [place] [address]                                              Services:  3D  Mammogram and Bone Density

## 2022-04-11 NOTE — Assessment & Plan Note (Signed)
bp in fair control at this time  BP Readings from Last 1 Encounters:  04/11/22 122/74   No changes needed Most recent labs reviewed  Disc lifstyle change with low sodium diet and exercise  triam hct 37.5-25 mg daily = will continue  Lab ordered  Great exercise /commended

## 2022-04-12 ENCOUNTER — Telehealth: Payer: Self-pay | Admitting: Cardiovascular Disease

## 2022-04-12 LAB — TSH: TSH: 1.12 u[IU]/mL (ref 0.35–5.50)

## 2022-04-12 NOTE — Telephone Encounter (Signed)
Pt c/o medication issue:  1. Name of Medication: ezetimibe (ZETIA) 10 MG tablet  2. How are you currently taking this medication (dosage and times per day)? Take 1 tablet (10 mg total) by mouth daily.  3. Are you having a reaction (difficulty breathing--STAT)? yes  4. What is your medication issue? Causing patient stomach issues, body aches in her ankles, and cramps. Patient stops the medication on yesterday. Please advise

## 2022-04-14 ENCOUNTER — Telehealth: Payer: Self-pay | Admitting: *Deleted

## 2022-04-14 MED ORDER — POTASSIUM CHLORIDE ER 10 MEQ PO TBCR: EXTENDED_RELEASE_TABLET | ORAL | 1 refills | Status: DC

## 2022-04-14 NOTE — Telephone Encounter (Signed)
I spoke with the patient and advised her of Dr. Donivan Scull recommendations as stated below.   Per the patient, she started zetia on 04/02/22 and by 04/10/22, she was having stomach cramping/ diarrhea.  She took the last dose on 04/11/22. Today she is seeing some improvement in her symptoms.  She is agreeable with Dr. Donivan Scull recommendations to stay off zetia. She will follow up with Dr. Glori Bickers regarding any further recommendations.  She was very appreciative of the call back.

## 2022-04-14 NOTE — Telephone Encounter (Signed)
Pt viewed lab results and Dr. Marliss Coots comments via mychart. Pt doesn't remember missing any K pills so will increase to 3 tabs daily Rx sent to pharmacy. Pt will call back and schedule lab appt once she knows her schedule

## 2022-04-14 NOTE — Telephone Encounter (Signed)
Minna Merritts, MD  Sent: Fri April 14, 2022  3:06 PM  To: [facility] T[facility]          Message  If she feels she is having side effects on the Zetia could hold the medication for now  On her next visit with our office or primary care, could discuss other treatment options such as Repatha/Praluent  Thx  Tgollan

## 2022-04-14 NOTE — Telephone Encounter (Signed)
-----   Message from Abner Greenspan, MD sent at 04/12/2022 10:25 AM EDT ----- Your cholesterol is already improving with zetia and diet , this is very reassuring (it should look even better in 5 weeks) Your potassium is low , if you have not missed doses please go up to 3 pills (30 meq total) of potassium daily and let's re check this in 1-2 weeks  (please change on med list -take 3 klor con 10 meq each daily)  Send in 3 mo supply with a refill if needed also and schedule lab for bmet 1-2 wk  Other labs are stable

## 2022-04-14 NOTE — Telephone Encounter (Signed)
Pt called stating she is now experiencing yellow drainage when blowing her nose. Pt is wanting to update [facility] on drainage & is asking for advice on what to do? Call back # [phone]

## 2022-04-15 NOTE — Telephone Encounter (Signed)
Likely a cold. Drink fluids and treat symptoms if needed. If worse do a home covid test since symptoms can be the same and watch for fever. Follow up if worse/if needed

## 2022-04-17 NOTE — Telephone Encounter (Signed)
Pt notified of Dr. Tower's comments and verbalized understanding  

## 2022-04-21 DIAGNOSIS — J3089 Other allergic rhinitis: Secondary | ICD-10-CM | POA: Diagnosis not present

## 2022-04-27 DIAGNOSIS — J3081 Allergic rhinitis due to animal (cat) (dog) hair and dander: Secondary | ICD-10-CM | POA: Diagnosis not present

## 2022-04-27 DIAGNOSIS — J3089 Other allergic rhinitis: Secondary | ICD-10-CM | POA: Diagnosis not present

## 2022-04-27 DIAGNOSIS — J301 Allergic rhinitis due to pollen: Secondary | ICD-10-CM | POA: Diagnosis not present

## 2022-05-02 DIAGNOSIS — J301 Allergic rhinitis due to pollen: Secondary | ICD-10-CM | POA: Diagnosis not present

## 2022-05-02 DIAGNOSIS — J328 Other chronic sinusitis: Secondary | ICD-10-CM | POA: Diagnosis not present

## 2022-05-02 DIAGNOSIS — J3089 Other allergic rhinitis: Secondary | ICD-10-CM | POA: Diagnosis not present

## 2022-05-09 DIAGNOSIS — J3089 Other allergic rhinitis: Secondary | ICD-10-CM | POA: Diagnosis not present

## 2022-05-17 DIAGNOSIS — J3089 Other allergic rhinitis: Secondary | ICD-10-CM | POA: Diagnosis not present

## 2022-05-18 DIAGNOSIS — J3081 Allergic rhinitis due to animal (cat) (dog) hair and dander: Secondary | ICD-10-CM | POA: Diagnosis not present

## 2022-05-18 DIAGNOSIS — J301 Allergic rhinitis due to pollen: Secondary | ICD-10-CM | POA: Diagnosis not present

## 2022-05-18 DIAGNOSIS — J3089 Other allergic rhinitis: Secondary | ICD-10-CM | POA: Diagnosis not present

## 2022-05-22 DIAGNOSIS — J320 Chronic maxillary sinusitis: Secondary | ICD-10-CM | POA: Diagnosis not present

## 2022-05-22 DIAGNOSIS — J3489 Other specified disorders of nose and nasal sinuses: Secondary | ICD-10-CM | POA: Diagnosis not present

## 2022-05-23 DIAGNOSIS — J301 Allergic rhinitis due to pollen: Secondary | ICD-10-CM | POA: Diagnosis not present

## 2022-05-23 DIAGNOSIS — J3081 Allergic rhinitis due to animal (cat) (dog) hair and dander: Secondary | ICD-10-CM | POA: Diagnosis not present

## 2022-05-23 DIAGNOSIS — J3089 Other allergic rhinitis: Secondary | ICD-10-CM | POA: Diagnosis not present

## 2022-06-01 DIAGNOSIS — J3089 Other allergic rhinitis: Secondary | ICD-10-CM | POA: Diagnosis not present

## 2022-06-08 DIAGNOSIS — R052 Subacute cough: Secondary | ICD-10-CM | POA: Diagnosis not present

## 2022-06-08 DIAGNOSIS — J3081 Allergic rhinitis due to animal (cat) (dog) hair and dander: Secondary | ICD-10-CM | POA: Diagnosis not present

## 2022-06-08 DIAGNOSIS — J3089 Other allergic rhinitis: Secondary | ICD-10-CM | POA: Diagnosis not present

## 2022-06-08 DIAGNOSIS — J301 Allergic rhinitis due to pollen: Secondary | ICD-10-CM | POA: Diagnosis not present

## 2022-06-13 ENCOUNTER — Telehealth: Payer: Self-pay | Admitting: Family Medicine

## 2022-06-13 DIAGNOSIS — J3489 Other specified disorders of nose and nasal sinuses: Secondary | ICD-10-CM | POA: Diagnosis not present

## 2022-06-13 DIAGNOSIS — J320 Chronic maxillary sinusitis: Secondary | ICD-10-CM | POA: Diagnosis not present

## 2022-06-13 NOTE — Telephone Encounter (Signed)
Mail box was full was not able to LVM to schedule AWV with [facility]

## 2022-06-15 ENCOUNTER — Other Ambulatory Visit: Payer: Medicare Other

## 2022-06-15 DIAGNOSIS — J3089 Other allergic rhinitis: Secondary | ICD-10-CM | POA: Diagnosis not present

## 2022-06-15 DIAGNOSIS — J3081 Allergic rhinitis due to animal (cat) (dog) hair and dander: Secondary | ICD-10-CM | POA: Diagnosis not present

## 2022-06-15 DIAGNOSIS — J301 Allergic rhinitis due to pollen: Secondary | ICD-10-CM | POA: Diagnosis not present

## 2022-06-20 DIAGNOSIS — J3089 Other allergic rhinitis: Secondary | ICD-10-CM | POA: Diagnosis not present

## 2022-06-22 ENCOUNTER — Telehealth: Payer: Self-pay

## 2022-06-22 ENCOUNTER — Ambulatory Visit: Payer: Medicare Other

## 2022-06-22 DIAGNOSIS — Z0000 Encounter for general adult medical examination without abnormal findings: Secondary | ICD-10-CM

## 2022-06-22 NOTE — Progress Notes (Unsigned)
Subjective:   Amy Herman is a 76 y.o. female who presents for Medicare Annual (Subsequent) preventive examination.       Objective:    There were no vitals filed for this visit. There is no height or weight on file to calculate BMI.     02/16/2022   10:32 AM 12/12/2021    6:50 PM 12/12/2021    9:45 AM 11/29/2021    1:18 PM 12/09/2020    8:50 AM 08/03/2020    3:45 PM 05/08/2016    2:35 PM  Advanced Directives  Does Patient Have a Medical Advance Directive? Yes  No No No Yes No  Type of Paramedic of [place];Living will     [place];Living will   Does patient want to make changes to medical advance directive? No - Patient declined        Copy of [place] in Chart? No - copy requested     No - copy requested   Would patient like information on creating a medical advance directive?  No - Patient declined   No - Patient declined  No - patient declined information    Current Medications (verified) Outpatient Encounter Medications as of 06/22/2022  Medication Sig   acetaminophen (TYLENOL) 500 MG tablet Take 500 mg by mouth every 8 (eight) hours as needed for moderate pain.   albuterol (VENTOLIN HFA) 108 (90 Base) MCG/ACT inhaler Inhale 1 puff into the lungs every 6 (six) hours as needed for wheezing or shortness of breath.   ascorbic acid (VITAMIN C) 500 MG tablet Take 500 mg by mouth daily.   ASPIRIN 81 PO Take by mouth daily.   bimatoprost (LUMIGAN) 0.01 % SOLN Place 1 drop into both eyes at bedtime.   calcium citrate-vitamin D (CITRACAL+D) 315-200 MG-UNIT per tablet Take 1 tablet by mouth daily.   cetirizine (ZYRTEC) 10 MG tablet Take 10 mg by mouth daily.   Cholecalciferol (VITAMIN D3) 125 MCG (5000 UT) CAPS Take 1,000 Units by mouth daily.   EPINEPHrine 0.3 mg/0.3 mL IJ SOAJ injection Inject 0.3 mg into the muscle as needed for anaphylaxis.   erythromycin ophthalmic ointment Apply to sutures 4 times a day for 10-12  days.  Discontinue if allergy develops and call our office   fluticasone (FLONASE) 50 MCG/ACT nasal spray Place 2 sprays into both nostrils as needed.   Garlic 1610 MG CAPS Take 1,000 mg by mouth daily.   montelukast (SINGULAIR) 10 MG tablet Take 10 mg by mouth at bedtime as needed (allergies).   Omega 3 1000 MG CAPS Take 1,000 mg by mouth daily.   omeprazole (PRILOSEC) 20 MG capsule Take 20 mg by mouth daily as needed (acid reflux).   potassium chloride (KLOR-CON) 10 MEQ tablet Take 3 tablets (30 mEq  total) by mouth daily.   Sodium Chloride-Sodium Bicarb (SINUS [place] NETI POT NA) Place 1 application. into the nose as needed.   traMADol (ULTRAM) 50 MG tablet Take 1 every 4-6 hours as needed for pain not controlled by Tylenol   triamterene-hydrochlorothiazide (MAXZIDE-25) 37.5-25 MG tablet Take 1 tablet by mouth daily.   No facility-administered encounter medications on file as of 06/22/2022.    Allergies (verified) Timoptic [timolol], Alphagan [brimonidine], Cefuroxime axetil, Cefuroxime axetil, Clarithromycin, Levofloxacin, Lipitor [atorvastatin], Losartan, Other, Rosuvastatin, Valsartan, and Zetia [ezetimibe]   History: Past Medical History:  Diagnosis Date   Allergic rhinitis    Allergy    Bronchitis    more frequent  in the past   Cataract    "suspect per pt"   Colon polyps    history of   GERD (gastroesophageal reflux disease)    Glaucoma    suspected   Heart murmur    History of kidney stones    suspects that she had one and past it   Hyperlipidemia    Hypertension    Orthodontics    Invisalign   Osteoarthritis of knee    Osteopenia    PONV (postoperative nausea and vomiting)    with tubal ligation years ago   Past Surgical History:  Procedure Laterality Date   BROW LIFT Bilateral 02/16/2022   Procedure: BLEPHAROPLASTY UPPER EYELID; W/EXCESS SKIN BILATERAL BLEPHAROPTOSIS REPAIR; RESECT EX BILATERAL;  Surgeon: Karle Starch, MD;  Location: [facility];   Service: Ophthalmology;  Laterality: Bilateral;   BUNIONECTOMY     bilateral-PIN LEFT GREAT TOE   CARPAL TUNNEL RELEASE     COLONOSCOPY     COLONOSCOPY W/ POLYPECTOMY     FUNCTIONAL ENDOSCOPIC SINUS SURGERY     05-04-2011   HYSTERECTOMY     only removed uterus   KNEE ARTHROPLASTY Right 12/12/2021   Procedure: COMPUTER ASSISTED TOTAL KNEE ARTHROPLASTY;  Surgeon: Dereck Leep, MD;  Location: [facility];  Service: Orthopedics;  Laterality: Right;   KNEE ARTHROSCOPY     right   TUBAL LIGATION     bilateral   VEIN SURGERY  2006   legs   Family History  Problem Relation Age of Onset   Cancer Mother        colon, lungs, liver   Colon cancer Mother 43   Cancer Father        prostate   Heart disease Father        A-fib   Stroke Father        TIA's   Alcohol abuse Brother    Cancer Brother        pancreatic, smoker   Diabetes Brother    Diabetes Brother    Stomach cancer Neg Hx    Esophageal cancer Neg Hx    Rectal cancer Neg Hx    Breast cancer Neg Hx    Social History   Socioeconomic History   Marital status: Widowed    Spouse name: Not on file   Number of children: Not on file   Years of education: Not on file   Highest education level: Not on file  Occupational History   Occupation: [employer]    Comment: over 30 years  Tobacco Use   Smoking status: Never   Smokeless tobacco: Never   Tobacco comments:    used to have secondary smoke exposure from husband  Vaping Use   Vaping Use: Never used  Substance and Sexual Activity   Alcohol use: No    Alcohol/week: 0.0 standard drinks of alcohol   Drug use: No   Sexual activity: Never    Birth control/protection: Post-menopausal  Other Topics Concern   Not on file  Social History Narrative   Retired from [employer] in 2014.    Works for [employer] alone   Works out    Right handed   Caffeine: none   Social Determinants of Radio broadcast assistant Strain: [place]  (08/03/2020)   Overall Financial Resource  Strain (CARDIA)    Difficulty of Paying Living Expenses: Not hard at all  Food Insecurity: No Food Insecurity (08/03/2020)   Hunger Vital Sign  Worried About Charity fundraiser in the Last Year: Never true    [place] in the Last Year: Never true  Transportation Needs: No Transportation Needs (08/03/2020)   PRAPARE - Hydrologist (Medical): No    Lack of Transportation (Non-Medical): No  Physical Activity: Sufficiently Active (08/03/2020)   Exercise Vital Sign    Days of Exercise per Week: 5 days    Minutes of Exercise per Session: 60 min  Stress: No Stress Concern Present (08/03/2020)   [place]    Feeling of Stress : Not at all  Social Connections: Not on file    Tobacco Counseling Counseling given: Not Answered Tobacco comments: used to have secondary smoke exposure from husband   Clinical Intake:  Denies any pain/chronic pain                Diabetic? no         Activities of Daily Living    02/16/2022   10:17 AM 12/12/2021    6:00 PM  In your present state of health, do you have any difficulty performing the following activities:  Hearing? 0 0  Vision? 0 0  Difficulty concentrating or making decisions? 0 0  Walking or climbing stairs? 0 0  Dressing or bathing? 0 0  Doing errands, shopping?  0    Patient Care Team: Tower, Wynelle Fanny, MD as PCP - General Linus Mako, MD as Attending Physician (Family Medicine) Elsie Saas, MD as Consulting Physician (Orthopedic Surgery) Dingeldein, Remo Lipps, MD as Consulting Physician (Ophthalmology)  Indicate any recent Medical Services you may have received from other than [facility] providers in the past year (date may be approximate).     Assessment:   This is a routine wellness examination for [place].  Hearing/Vision screen No recent changes in vision, routine 6 month eye exams No recent changes in hearing.    Dietary issues and exercise activities discussed:     Goals Addressed   None   Depression Screen    09/19/2021    2:50 PM 08/03/2020    3:51 PM 05/09/2019    3:20 PM 03/29/2018   10:37 AM 03/29/2018   10:26 AM 05/08/2016    2:26 PM 06/28/2012   10:18 AM  PHQ 2/9 Scores  PHQ - 2 Score 0 0 0 0 0 0 0  PHQ- 9 Score  0         Fall Risk    09/19/2021    2:50 PM 08/03/2020    3:50 PM 05/09/2019    3:20 PM 03/29/2018   10:37 AM 03/29/2018   10:26 AM  [place] in the past year? 0 0 0 No No  Number falls in past yr: 0 0     Injury with Fall? 0 0     Risk for fall due to :  No Fall Risks     Follow up Falls evaluation completed Falls evaluation completed;Falls prevention discussed Falls evaluation completed      FALL RISK PREVENTION PERTAINING TO THE HOME:  Any stairs in or around the home? No  If so, are there any without handrails? No  Home free of loose throw rugs in walkways, pet beds, electrical cords, etc? Yes  Adequate lighting in your home to reduce risk of falls? {YES/NO:21197}  ASSISTIVE DEVICES UTILIZED TO PREVENT FALLS:  Life alert? No  Use of a cane, walker or w/c? No  Grab bars in the bathroom? No  Shower chair or bench in shower? No  Elevated toilet seat or a handicapped toilet? No   TIMED UP AND GO:  Was the test performed? No .  Length of time to ambulate 10 feet:     Cognitive Function:    08/03/2020    3:55 PM 05/08/2016    2:27 PM  MMSE - Mini Mental State Exam  Not completed: Refused   Orientation to time  5  Orientation to Place  5  Registration  3  Attention/ Calculation  0  Recall  3  Language- name 2 objects  0  Language- repeat  1  Language- follow 3 step command  3  Language- read & follow direction  0  Write a sentence  0  Copy design  0  Total score  20        Immunizations Immunization History  Administered Date(s) Administered   Fluad Quad(high Dose 65+) 04/10/2019, 04/24/2020, 04/11/2022   Influenza Split  06/08/2011   Influenza Whole 05/03/2009   Influenza, High Dose Seasonal PF 08/05/2013, 04/19/2021   Influenza,inj,Quad PF,6+ Mos 04/10/2013, 06/03/2014, 07/29/2015, 05/08/2016, 03/29/2018   Influenza-Unspecified 04/16/2017   Moderna Sars-Covid-2 Vaccination 08/16/2019, 09/13/2019, 06/29/2020   PFIZER(Purple Top)SARS-COV-2 Vaccination 12/10/2020   Pneumococcal Conjugate-13 08/12/2015   Pneumococcal Polysaccharide-23 07/03/2004, 01/31/2011, 08/05/2013, 11/18/2015, 11/09/2016, 11/08/2017, 12/11/2019, 02/10/2021   Td 07/23/2006   Tdap 07/24/2017   Zoster, Live 06/13/2007    TDAP status: Due, Education has been provided regarding the importance of this vaccine. Advised may receive this vaccine at local pharmacy or Health Dept. Aware to provide a copy of the vaccination record if obtained from local pharmacy or Health Dept. Verbalized acceptance and understanding.  Flu Vaccine status: Up to date  Pneumococcal vaccine status: Up to date  Covid-19 vaccine status: Completed vaccines  Qualifies for Shingles Vaccine? Yes   Zostavax completed No   Shingrix Completed?: No.    Education has been provided regarding the importance of this vaccine. Patient has been advised to call insurance company to determine out of pocket expense if they have not yet received this vaccine. Advised may also receive vaccine at local pharmacy or Health Dept. Verbalized acceptance and understanding.  Screening Tests Health Maintenance  Topic Date Due   Zoster Vaccines- Shingrix (1 of 2) Never done   COVID-19 Vaccine (5 - 2023-24 season) 03/03/2022   COLONOSCOPY (Pts 45-19yr Insurance coverage will need to be confirmed)  08/01/2022   MAMMOGRAM  12/09/2022   Medicare Annual Wellness (AWV)  06/23/2023   DTaP/Tdap/Td (3 - Td or Tdap) 07/25/2027   Pneumonia Vaccine 76 Years old  Completed   INFLUENZA VACCINE  Completed   DEXA SCAN  Completed   Hepatitis C Screening  Completed   HPV VACCINES  Aged Out    Health  Maintenance  Health Maintenance Due  Topic Date Due   Zoster Vaccines- Shingrix (1 of 2) Never done   COVID-19 Vaccine (5 - 2023-24 season) 03/03/2022    Colorectal cancer screening: No longer required.   Mammogram status: Completed 2023. Repeat every year  Bone Density status: Ordered 04/11/22. Pt provided with contact info and advised to call to schedule appt.  Lung Cancer Screening: (Low Dose CT Chest recommended if Age 77-80years, 30 pack-year currently smoking OR have quit w/in 15years.) does not qualify.   Lung Cancer Screening Referral:   Additional Screening:  Hepatitis C Screening: does not qualify  Vision Screening: Recommended annual ophthalmology exams for early  detection of glaucoma and other disorders of the eye. Is the patient up to date with their annual eye exam?  Yes    Dental Screening: Recommended annual dental exams for proper oral hygiene  Community Resource Referral / Chronic Care Management: CRR required this visit?  No   CCM required this visit?  No      Plan:     I have personally reviewed and noted the following in the patient's chart:   Medical and social history Use of alcohol, tobacco or illicit drugs  Current medications and supplements including opioid prescriptions. Patient is not currently taking opioid prescriptions. Functional ability and status Nutritional status Physical activity Advanced directives List of other physicians Hospitalizations, surgeries, and ER visits in previous 12 months Vitals Screenings to include cognitive, depression, and falls Referrals and appointments  In addition, I have reviewed and discussed with patient certain preventive protocols, quality metrics, and best practice recommendations. A written personalized care plan for preventive services as well as general preventive health recommendations were provided to patient.     Jodell Cipro, [place]   06/22/2022

## 2022-06-22 NOTE — Telephone Encounter (Signed)
Spoke to patient to complete AWV. DEXA was ordered in October, patient is needing contact info to get scheduled. I am not familiar with [facility] number. Please have your CMA reach out to patient. Thanks!

## 2022-06-22 NOTE — Telephone Encounter (Signed)
Can you please give her the # for [place]?   Thanks

## 2022-06-23 ENCOUNTER — Other Ambulatory Visit (INDEPENDENT_AMBULATORY_CARE_PROVIDER_SITE_OTHER): Payer: Medicare Other

## 2022-06-23 DIAGNOSIS — E782 Mixed hyperlipidemia: Secondary | ICD-10-CM

## 2022-06-23 LAB — LIPID PANEL
Cholesterol: 223 mg/dL — ABNORMAL HIGH (ref 0–200)
HDL: 67.6 mg/dL (ref >39.00)
LDL Cholesterol: 140 mg/dL — ABNORMAL HIGH (ref 0–99)
NonHDL: 155.79
Total CHOL/HDL Ratio: 3
Triglycerides: 78 mg/dL (ref 0.0–149.0)
VLDL: 15.6 mg/dL (ref 0.0–40.0)

## 2022-06-23 LAB — AST: AST: 19 U/L (ref 0–37)

## 2022-06-23 NOTE — Telephone Encounter (Signed)
Tried to call patient but no answer and VM was full

## 2022-06-28 NOTE — Telephone Encounter (Signed)
Called patient gave number and had repeat back to me. Will call and set up appointment.

## 2022-06-30 DIAGNOSIS — J301 Allergic rhinitis due to pollen: Secondary | ICD-10-CM | POA: Diagnosis not present

## 2022-06-30 DIAGNOSIS — J3089 Other allergic rhinitis: Secondary | ICD-10-CM | POA: Diagnosis not present

## 2022-06-30 DIAGNOSIS — J3081 Allergic rhinitis due to animal (cat) (dog) hair and dander: Secondary | ICD-10-CM | POA: Diagnosis not present

## 2022-07-07 DIAGNOSIS — J3089 Other allergic rhinitis: Secondary | ICD-10-CM | POA: Diagnosis not present

## 2022-07-12 DIAGNOSIS — H02203 Unspecified lagophthalmos right eye, unspecified eyelid: Secondary | ICD-10-CM | POA: Diagnosis not present

## 2022-07-13 DIAGNOSIS — J3089 Other allergic rhinitis: Secondary | ICD-10-CM | POA: Diagnosis not present

## 2022-07-18 DIAGNOSIS — J301 Allergic rhinitis due to pollen: Secondary | ICD-10-CM | POA: Diagnosis not present

## 2022-07-18 DIAGNOSIS — J3081 Allergic rhinitis due to animal (cat) (dog) hair and dander: Secondary | ICD-10-CM | POA: Diagnosis not present

## 2022-07-18 DIAGNOSIS — J3089 Other allergic rhinitis: Secondary | ICD-10-CM | POA: Diagnosis not present

## 2022-07-28 DIAGNOSIS — J3089 Other allergic rhinitis: Secondary | ICD-10-CM | POA: Diagnosis not present

## 2022-07-28 DIAGNOSIS — J3081 Allergic rhinitis due to animal (cat) (dog) hair and dander: Secondary | ICD-10-CM | POA: Diagnosis not present

## 2022-07-28 DIAGNOSIS — J301 Allergic rhinitis due to pollen: Secondary | ICD-10-CM | POA: Diagnosis not present

## 2022-08-03 DIAGNOSIS — J301 Allergic rhinitis due to pollen: Secondary | ICD-10-CM | POA: Diagnosis not present

## 2022-08-03 DIAGNOSIS — J3081 Allergic rhinitis due to animal (cat) (dog) hair and dander: Secondary | ICD-10-CM | POA: Diagnosis not present

## 2022-08-03 DIAGNOSIS — J3089 Other allergic rhinitis: Secondary | ICD-10-CM | POA: Diagnosis not present

## 2022-08-08 ENCOUNTER — Other Ambulatory Visit: Payer: Self-pay | Admitting: Family Medicine

## 2022-08-10 DIAGNOSIS — J3089 Other allergic rhinitis: Secondary | ICD-10-CM | POA: Diagnosis not present

## 2022-08-17 DIAGNOSIS — J301 Allergic rhinitis due to pollen: Secondary | ICD-10-CM | POA: Diagnosis not present

## 2022-08-17 DIAGNOSIS — J3081 Allergic rhinitis due to animal (cat) (dog) hair and dander: Secondary | ICD-10-CM | POA: Diagnosis not present

## 2022-08-17 DIAGNOSIS — J3089 Other allergic rhinitis: Secondary | ICD-10-CM | POA: Diagnosis not present

## 2022-08-25 DIAGNOSIS — J3089 Other allergic rhinitis: Secondary | ICD-10-CM | POA: Diagnosis not present

## 2022-08-28 DIAGNOSIS — H401131 Primary open-angle glaucoma, bilateral, mild stage: Secondary | ICD-10-CM | POA: Diagnosis not present

## 2022-09-01 DIAGNOSIS — J301 Allergic rhinitis due to pollen: Secondary | ICD-10-CM | POA: Diagnosis not present

## 2022-09-01 DIAGNOSIS — J3081 Allergic rhinitis due to animal (cat) (dog) hair and dander: Secondary | ICD-10-CM | POA: Diagnosis not present

## 2022-09-01 DIAGNOSIS — J3089 Other allergic rhinitis: Secondary | ICD-10-CM | POA: Diagnosis not present

## 2022-09-08 DIAGNOSIS — J3089 Other allergic rhinitis: Secondary | ICD-10-CM | POA: Diagnosis not present

## 2022-09-12 ENCOUNTER — Other Ambulatory Visit: Payer: Medicare Other

## 2022-09-15 DIAGNOSIS — J3089 Other allergic rhinitis: Secondary | ICD-10-CM | POA: Diagnosis not present

## 2022-09-15 DIAGNOSIS — J301 Allergic rhinitis due to pollen: Secondary | ICD-10-CM | POA: Diagnosis not present

## 2022-09-15 DIAGNOSIS — J3081 Allergic rhinitis due to animal (cat) (dog) hair and dander: Secondary | ICD-10-CM | POA: Diagnosis not present

## 2022-09-22 DIAGNOSIS — J3089 Other allergic rhinitis: Secondary | ICD-10-CM | POA: Diagnosis not present

## 2022-09-22 DIAGNOSIS — J301 Allergic rhinitis due to pollen: Secondary | ICD-10-CM | POA: Diagnosis not present

## 2022-09-22 DIAGNOSIS — J3081 Allergic rhinitis due to animal (cat) (dog) hair and dander: Secondary | ICD-10-CM | POA: Diagnosis not present

## 2022-09-28 DIAGNOSIS — J301 Allergic rhinitis due to pollen: Secondary | ICD-10-CM | POA: Diagnosis not present

## 2022-09-28 DIAGNOSIS — J3089 Other allergic rhinitis: Secondary | ICD-10-CM | POA: Diagnosis not present

## 2022-09-28 DIAGNOSIS — J3081 Allergic rhinitis due to animal (cat) (dog) hair and dander: Secondary | ICD-10-CM | POA: Diagnosis not present

## 2022-10-03 DIAGNOSIS — J320 Chronic maxillary sinusitis: Secondary | ICD-10-CM | POA: Diagnosis not present

## 2022-10-03 DIAGNOSIS — J302 Other seasonal allergic rhinitis: Secondary | ICD-10-CM | POA: Diagnosis not present

## 2022-10-06 DIAGNOSIS — J3081 Allergic rhinitis due to animal (cat) (dog) hair and dander: Secondary | ICD-10-CM | POA: Diagnosis not present

## 2022-10-06 DIAGNOSIS — J3089 Other allergic rhinitis: Secondary | ICD-10-CM | POA: Diagnosis not present

## 2022-10-06 DIAGNOSIS — J301 Allergic rhinitis due to pollen: Secondary | ICD-10-CM | POA: Diagnosis not present

## 2022-10-12 DIAGNOSIS — J301 Allergic rhinitis due to pollen: Secondary | ICD-10-CM | POA: Diagnosis not present

## 2022-10-12 DIAGNOSIS — J3081 Allergic rhinitis due to animal (cat) (dog) hair and dander: Secondary | ICD-10-CM | POA: Diagnosis not present

## 2022-10-12 DIAGNOSIS — J3089 Other allergic rhinitis: Secondary | ICD-10-CM | POA: Diagnosis not present

## 2022-10-18 DIAGNOSIS — J3089 Other allergic rhinitis: Secondary | ICD-10-CM | POA: Diagnosis not present

## 2022-10-19 DIAGNOSIS — J301 Allergic rhinitis due to pollen: Secondary | ICD-10-CM | POA: Diagnosis not present

## 2022-10-19 DIAGNOSIS — J3081 Allergic rhinitis due to animal (cat) (dog) hair and dander: Secondary | ICD-10-CM | POA: Diagnosis not present

## 2022-10-19 DIAGNOSIS — J3089 Other allergic rhinitis: Secondary | ICD-10-CM | POA: Diagnosis not present

## 2022-10-24 ENCOUNTER — Other Ambulatory Visit: Payer: Medicare Other

## 2022-10-26 DIAGNOSIS — J3081 Allergic rhinitis due to animal (cat) (dog) hair and dander: Secondary | ICD-10-CM | POA: Diagnosis not present

## 2022-10-26 DIAGNOSIS — J3089 Other allergic rhinitis: Secondary | ICD-10-CM | POA: Diagnosis not present

## 2022-10-26 DIAGNOSIS — J301 Allergic rhinitis due to pollen: Secondary | ICD-10-CM | POA: Diagnosis not present

## 2022-11-02 DIAGNOSIS — J3089 Other allergic rhinitis: Secondary | ICD-10-CM | POA: Diagnosis not present

## 2022-11-02 DIAGNOSIS — J301 Allergic rhinitis due to pollen: Secondary | ICD-10-CM | POA: Diagnosis not present

## 2022-11-02 DIAGNOSIS — J3081 Allergic rhinitis due to animal (cat) (dog) hair and dander: Secondary | ICD-10-CM | POA: Diagnosis not present

## 2022-11-06 DIAGNOSIS — J301 Allergic rhinitis due to pollen: Secondary | ICD-10-CM | POA: Diagnosis not present

## 2022-11-06 DIAGNOSIS — J320 Chronic maxillary sinusitis: Secondary | ICD-10-CM | POA: Diagnosis not present

## 2022-11-09 DIAGNOSIS — J3089 Other allergic rhinitis: Secondary | ICD-10-CM | POA: Diagnosis not present

## 2022-11-09 DIAGNOSIS — J301 Allergic rhinitis due to pollen: Secondary | ICD-10-CM | POA: Diagnosis not present

## 2022-11-09 DIAGNOSIS — J3081 Allergic rhinitis due to animal (cat) (dog) hair and dander: Secondary | ICD-10-CM | POA: Diagnosis not present

## 2022-11-16 DIAGNOSIS — J3081 Allergic rhinitis due to animal (cat) (dog) hair and dander: Secondary | ICD-10-CM | POA: Diagnosis not present

## 2022-11-16 DIAGNOSIS — J3089 Other allergic rhinitis: Secondary | ICD-10-CM | POA: Diagnosis not present

## 2022-11-16 DIAGNOSIS — J301 Allergic rhinitis due to pollen: Secondary | ICD-10-CM | POA: Diagnosis not present

## 2022-12-01 DIAGNOSIS — J301 Allergic rhinitis due to pollen: Secondary | ICD-10-CM | POA: Diagnosis not present

## 2022-12-01 DIAGNOSIS — J3089 Other allergic rhinitis: Secondary | ICD-10-CM | POA: Diagnosis not present

## 2022-12-01 DIAGNOSIS — J3081 Allergic rhinitis due to animal (cat) (dog) hair and dander: Secondary | ICD-10-CM | POA: Diagnosis not present

## 2022-12-07 DIAGNOSIS — J3081 Allergic rhinitis due to animal (cat) (dog) hair and dander: Secondary | ICD-10-CM | POA: Diagnosis not present

## 2022-12-07 DIAGNOSIS — J301 Allergic rhinitis due to pollen: Secondary | ICD-10-CM | POA: Diagnosis not present

## 2022-12-07 DIAGNOSIS — J3089 Other allergic rhinitis: Secondary | ICD-10-CM | POA: Diagnosis not present

## 2022-12-21 DIAGNOSIS — J3081 Allergic rhinitis due to animal (cat) (dog) hair and dander: Secondary | ICD-10-CM | POA: Diagnosis not present

## 2022-12-21 DIAGNOSIS — J3089 Other allergic rhinitis: Secondary | ICD-10-CM | POA: Diagnosis not present

## 2022-12-21 DIAGNOSIS — J301 Allergic rhinitis due to pollen: Secondary | ICD-10-CM | POA: Diagnosis not present

## 2022-12-28 ENCOUNTER — Other Ambulatory Visit: Payer: Self-pay | Admitting: Family Medicine

## 2022-12-28 ENCOUNTER — Ambulatory Visit
Admission: RE | Admit: 2022-12-28 | Discharge: 2022-12-28 | Disposition: A | Discharge status: Home / Self Care | Payer: Medicare Other | Source: Ambulatory Visit | Attending: Family Medicine | Admitting: Family Medicine

## 2022-12-28 DIAGNOSIS — M810 Age-related osteoporosis without current pathological fracture: Secondary | ICD-10-CM | POA: Diagnosis not present

## 2022-12-28 DIAGNOSIS — E2839 Other primary ovarian failure: Secondary | ICD-10-CM | POA: Insufficient documentation

## 2022-12-28 DIAGNOSIS — Z780 Asymptomatic menopausal state: Secondary | ICD-10-CM | POA: Diagnosis not present

## 2022-12-28 DIAGNOSIS — J3089 Other allergic rhinitis: Secondary | ICD-10-CM | POA: Diagnosis not present

## 2022-12-28 DIAGNOSIS — J301 Allergic rhinitis due to pollen: Secondary | ICD-10-CM | POA: Diagnosis not present

## 2022-12-28 DIAGNOSIS — Z1231 Encounter for screening mammogram for malignant neoplasm of breast: Secondary | ICD-10-CM

## 2022-12-28 DIAGNOSIS — J3081 Allergic rhinitis due to animal (cat) (dog) hair and dander: Secondary | ICD-10-CM | POA: Diagnosis not present

## 2022-12-31 ENCOUNTER — Encounter: Payer: Self-pay | Admitting: Family Medicine

## 2023-01-01 ENCOUNTER — Encounter: Payer: Self-pay | Admitting: *Deleted

## 2023-01-01 ENCOUNTER — Ambulatory Visit
Admission: RE | Admit: 2023-01-01 | Discharge: 2023-01-01 | Disposition: A | Discharge status: Home / Self Care | Payer: Medicare Other | Source: Ambulatory Visit | Attending: Family Medicine | Admitting: Family Medicine

## 2023-01-01 DIAGNOSIS — Z1231 Encounter for screening mammogram for malignant neoplasm of breast: Secondary | ICD-10-CM | POA: Insufficient documentation

## 2023-01-11 DIAGNOSIS — J301 Allergic rhinitis due to pollen: Secondary | ICD-10-CM | POA: Diagnosis not present

## 2023-01-11 DIAGNOSIS — J3089 Other allergic rhinitis: Secondary | ICD-10-CM | POA: Diagnosis not present

## 2023-01-11 DIAGNOSIS — J3081 Allergic rhinitis due to animal (cat) (dog) hair and dander: Secondary | ICD-10-CM | POA: Diagnosis not present

## 2023-01-18 DIAGNOSIS — J3081 Allergic rhinitis due to animal (cat) (dog) hair and dander: Secondary | ICD-10-CM | POA: Diagnosis not present

## 2023-01-18 DIAGNOSIS — J301 Allergic rhinitis due to pollen: Secondary | ICD-10-CM | POA: Diagnosis not present

## 2023-01-18 DIAGNOSIS — J3089 Other allergic rhinitis: Secondary | ICD-10-CM | POA: Diagnosis not present

## 2023-01-25 DIAGNOSIS — J3081 Allergic rhinitis due to animal (cat) (dog) hair and dander: Secondary | ICD-10-CM | POA: Diagnosis not present

## 2023-01-25 DIAGNOSIS — J301 Allergic rhinitis due to pollen: Secondary | ICD-10-CM | POA: Diagnosis not present

## 2023-01-25 DIAGNOSIS — J3089 Other allergic rhinitis: Secondary | ICD-10-CM | POA: Diagnosis not present

## 2023-01-30 DIAGNOSIS — Z96651 Presence of right artificial knee joint: Secondary | ICD-10-CM | POA: Diagnosis not present

## 2023-02-01 DIAGNOSIS — J3089 Other allergic rhinitis: Secondary | ICD-10-CM | POA: Diagnosis not present

## 2023-02-01 DIAGNOSIS — J301 Allergic rhinitis due to pollen: Secondary | ICD-10-CM | POA: Diagnosis not present

## 2023-02-01 DIAGNOSIS — J3081 Allergic rhinitis due to animal (cat) (dog) hair and dander: Secondary | ICD-10-CM | POA: Diagnosis not present

## 2023-02-09 DIAGNOSIS — J3081 Allergic rhinitis due to animal (cat) (dog) hair and dander: Secondary | ICD-10-CM | POA: Diagnosis not present

## 2023-02-09 DIAGNOSIS — J301 Allergic rhinitis due to pollen: Secondary | ICD-10-CM | POA: Diagnosis not present

## 2023-02-09 DIAGNOSIS — J3089 Other allergic rhinitis: Secondary | ICD-10-CM | POA: Diagnosis not present

## 2023-02-15 DIAGNOSIS — J3089 Other allergic rhinitis: Secondary | ICD-10-CM | POA: Diagnosis not present

## 2023-02-15 DIAGNOSIS — J3081 Allergic rhinitis due to animal (cat) (dog) hair and dander: Secondary | ICD-10-CM | POA: Diagnosis not present

## 2023-02-15 DIAGNOSIS — J301 Allergic rhinitis due to pollen: Secondary | ICD-10-CM | POA: Diagnosis not present

## 2023-02-23 DIAGNOSIS — J3081 Allergic rhinitis due to animal (cat) (dog) hair and dander: Secondary | ICD-10-CM | POA: Diagnosis not present

## 2023-02-23 DIAGNOSIS — J3089 Other allergic rhinitis: Secondary | ICD-10-CM | POA: Diagnosis not present

## 2023-02-23 DIAGNOSIS — J301 Allergic rhinitis due to pollen: Secondary | ICD-10-CM | POA: Diagnosis not present

## 2023-02-26 DIAGNOSIS — H2513 Age-related nuclear cataract, bilateral: Secondary | ICD-10-CM | POA: Diagnosis not present

## 2023-02-26 DIAGNOSIS — H43811 Vitreous degeneration, right eye: Secondary | ICD-10-CM | POA: Diagnosis not present

## 2023-02-26 DIAGNOSIS — H401131 Primary open-angle glaucoma, bilateral, mild stage: Secondary | ICD-10-CM | POA: Diagnosis not present

## 2023-03-01 DIAGNOSIS — J3089 Other allergic rhinitis: Secondary | ICD-10-CM | POA: Diagnosis not present

## 2023-03-01 DIAGNOSIS — J3081 Allergic rhinitis due to animal (cat) (dog) hair and dander: Secondary | ICD-10-CM | POA: Diagnosis not present

## 2023-03-01 DIAGNOSIS — J301 Allergic rhinitis due to pollen: Secondary | ICD-10-CM | POA: Diagnosis not present

## 2023-03-16 DIAGNOSIS — J3089 Other allergic rhinitis: Secondary | ICD-10-CM | POA: Diagnosis not present

## 2023-03-16 DIAGNOSIS — J3081 Allergic rhinitis due to animal (cat) (dog) hair and dander: Secondary | ICD-10-CM | POA: Diagnosis not present

## 2023-03-16 DIAGNOSIS — J301 Allergic rhinitis due to pollen: Secondary | ICD-10-CM | POA: Diagnosis not present

## 2023-03-22 DIAGNOSIS — J301 Allergic rhinitis due to pollen: Secondary | ICD-10-CM | POA: Diagnosis not present

## 2023-03-22 DIAGNOSIS — J3089 Other allergic rhinitis: Secondary | ICD-10-CM | POA: Diagnosis not present

## 2023-03-22 DIAGNOSIS — J3081 Allergic rhinitis due to animal (cat) (dog) hair and dander: Secondary | ICD-10-CM | POA: Diagnosis not present

## 2023-03-29 DIAGNOSIS — J3089 Other allergic rhinitis: Secondary | ICD-10-CM | POA: Diagnosis not present

## 2023-03-29 DIAGNOSIS — J3081 Allergic rhinitis due to animal (cat) (dog) hair and dander: Secondary | ICD-10-CM | POA: Diagnosis not present

## 2023-03-29 DIAGNOSIS — J301 Allergic rhinitis due to pollen: Secondary | ICD-10-CM | POA: Diagnosis not present

## 2023-04-13 DIAGNOSIS — J301 Allergic rhinitis due to pollen: Secondary | ICD-10-CM | POA: Diagnosis not present

## 2023-04-13 DIAGNOSIS — J3089 Other allergic rhinitis: Secondary | ICD-10-CM | POA: Diagnosis not present

## 2023-04-13 DIAGNOSIS — J3081 Allergic rhinitis due to animal (cat) (dog) hair and dander: Secondary | ICD-10-CM | POA: Diagnosis not present

## 2023-04-20 DIAGNOSIS — J3089 Other allergic rhinitis: Secondary | ICD-10-CM | POA: Diagnosis not present

## 2023-04-20 DIAGNOSIS — J3081 Allergic rhinitis due to animal (cat) (dog) hair and dander: Secondary | ICD-10-CM | POA: Diagnosis not present

## 2023-04-20 DIAGNOSIS — J301 Allergic rhinitis due to pollen: Secondary | ICD-10-CM | POA: Diagnosis not present

## 2023-04-26 ENCOUNTER — Other Ambulatory Visit: Payer: Self-pay | Admitting: Family Medicine

## 2023-04-27 ENCOUNTER — Ambulatory Visit: Payer: Medicare Other | Admitting: Podiatry

## 2023-04-27 NOTE — Telephone Encounter (Signed)
Refill request for triamterene-hydrochlorothiazide (MAXZIDE-25) 37.5-25 MG tablet  LOV - 04/11/22 Next OV - not scheduled yet Last refill - 04/11/22 #90/3

## 2023-04-27 NOTE — Telephone Encounter (Signed)
Spoke to pt, scheduled cpe for 05/25/23

## 2023-04-27 NOTE — Telephone Encounter (Signed)
Patient is past due for CPE; please call to schedule appt.

## 2023-05-01 ENCOUNTER — Ambulatory Visit: Payer: Medicare Other | Admitting: Podiatry

## 2023-05-04 DIAGNOSIS — J3081 Allergic rhinitis due to animal (cat) (dog) hair and dander: Secondary | ICD-10-CM | POA: Diagnosis not present

## 2023-05-04 DIAGNOSIS — J301 Allergic rhinitis due to pollen: Secondary | ICD-10-CM | POA: Diagnosis not present

## 2023-05-04 DIAGNOSIS — J3089 Other allergic rhinitis: Secondary | ICD-10-CM | POA: Diagnosis not present

## 2023-05-10 ENCOUNTER — Telehealth: Payer: Self-pay | Admitting: Family Medicine

## 2023-05-10 DIAGNOSIS — M810 Age-related osteoporosis without current pathological fracture: Secondary | ICD-10-CM

## 2023-05-10 DIAGNOSIS — I10 Essential (primary) hypertension: Secondary | ICD-10-CM

## 2023-05-10 DIAGNOSIS — Z79899 Other long term (current) drug therapy: Secondary | ICD-10-CM | POA: Insufficient documentation

## 2023-05-10 DIAGNOSIS — E782 Mixed hyperlipidemia: Secondary | ICD-10-CM

## 2023-05-10 NOTE — Telephone Encounter (Signed)
-----   Message from Alvina Chou sent at 05/10/2023  9:08 AM EST ----- Regarding: Lab orders for Fri, 11.8.24 Patient is scheduled for CPX labs, please order future labs, Thanks , Camelia Eng

## 2023-05-11 ENCOUNTER — Other Ambulatory Visit: Payer: Medicare Other

## 2023-05-11 ENCOUNTER — Ambulatory Visit: Payer: Medicare Other

## 2023-05-14 ENCOUNTER — Other Ambulatory Visit: Payer: Self-pay | Admitting: Family Medicine

## 2023-05-14 ENCOUNTER — Ambulatory Visit (INDEPENDENT_AMBULATORY_CARE_PROVIDER_SITE_OTHER): Payer: Medicare Other

## 2023-05-14 ENCOUNTER — Ambulatory Visit: Payer: Medicare Other | Admitting: Podiatry

## 2023-05-14 ENCOUNTER — Other Ambulatory Visit (INDEPENDENT_AMBULATORY_CARE_PROVIDER_SITE_OTHER): Payer: Medicare Other

## 2023-05-14 DIAGNOSIS — M810 Age-related osteoporosis without current pathological fracture: Secondary | ICD-10-CM

## 2023-05-14 DIAGNOSIS — Z23 Encounter for immunization: Secondary | ICD-10-CM | POA: Diagnosis not present

## 2023-05-14 DIAGNOSIS — Z79899 Other long term (current) drug therapy: Secondary | ICD-10-CM

## 2023-05-14 DIAGNOSIS — I10 Essential (primary) hypertension: Secondary | ICD-10-CM | POA: Diagnosis not present

## 2023-05-14 DIAGNOSIS — E782 Mixed hyperlipidemia: Secondary | ICD-10-CM | POA: Diagnosis not present

## 2023-05-14 LAB — COMPREHENSIVE METABOLIC PANEL
ALT: 19 U/L (ref 0–35)
AST: 24 U/L (ref 0–37)
Albumin: 3.9 g/dL (ref 3.5–5.2)
Alkaline Phosphatase: 62 U/L (ref 39–117)
BUN: 15 mg/dL (ref 6–23)
CO2: 29 meq/L (ref 19–32)
Calcium: 9.7 mg/dL (ref 8.4–10.5)
Chloride: 103 meq/L (ref 96–112)
Creatinine, Ser: 0.95 mg/dL (ref 0.40–1.20)
GFR: 57.49 mL/min — ABNORMAL LOW (ref >60.00)
Glucose, Bld: 88 mg/dL (ref 70–99)
Potassium: 3.9 meq/L (ref 3.5–5.1)
Sodium: 141 meq/L (ref 135–145)
Total Bilirubin: 0.6 mg/dL (ref 0.2–1.2)
Total Protein: 6.6 g/dL (ref 6.0–8.3)

## 2023-05-14 LAB — CBC WITH DIFFERENTIAL/PLATELET
Basophils Absolute: 0.1 10*3/uL (ref 0.0–0.1)
Basophils Relative: 2.4 % (ref 0.0–3.0)
Eosinophils Absolute: 0.3 10*3/uL (ref 0.0–0.7)
Eosinophils Relative: 7.1 % — ABNORMAL HIGH (ref 0.0–5.0)
HCT: 39.9 % (ref 36.0–46.0)
Hemoglobin: 13.3 g/dL (ref 12.0–15.0)
Lymphocytes Relative: 29.3 % (ref 12.0–46.0)
Lymphs Abs: 1.3 10*3/uL (ref 0.7–4.0)
MCHC: 33.4 g/dL (ref 30.0–36.0)
MCV: 91 fL (ref 78.0–100.0)
Monocytes Absolute: 0.4 10*3/uL (ref 0.1–1.0)
Monocytes Relative: 9.3 % (ref 3.0–12.0)
Neutro Abs: 2.3 10*3/uL (ref 1.4–7.7)
Neutrophils Relative %: 51.9 % (ref 43.0–77.0)
Platelets: 300 10*3/uL (ref 150.0–400.0)
RBC: 4.39 Mil/uL (ref 3.87–5.11)
RDW: 13.3 % (ref 11.5–15.5)
WBC: 4.5 10*3/uL (ref 4.0–10.5)

## 2023-05-14 LAB — LIPID PANEL
Cholesterol: 213 mg/dL — ABNORMAL HIGH (ref 0–200)
HDL: 68.2 mg/dL (ref >39.00)
LDL Cholesterol: 133 mg/dL — ABNORMAL HIGH (ref 0–99)
NonHDL: 145.29
Total CHOL/HDL Ratio: 3
Triglycerides: 62 mg/dL (ref 0.0–149.0)
VLDL: 12.4 mg/dL (ref 0.0–40.0)

## 2023-05-14 LAB — TSH: TSH: 1.27 u[IU]/mL (ref 0.35–5.50)

## 2023-05-14 LAB — VITAMIN D 25 HYDROXY (VIT D DEFICIENCY, FRACTURES): VITD: 62.18 ng/mL (ref 30.00–100.00)

## 2023-05-14 LAB — VITAMIN B12: Vitamin B-12: 631 pg/mL (ref 211–911)

## 2023-05-14 NOTE — Telephone Encounter (Signed)
Refill request for  Potassium Chloride ER 10 MEQ Oral Tablet Extended Release   LOV - 04/11/22 Next OV - 07/24/22 Last refill - 04/14/22 #270/1

## 2023-05-15 ENCOUNTER — Encounter: Payer: Self-pay | Admitting: Podiatry

## 2023-05-15 ENCOUNTER — Ambulatory Visit (INDEPENDENT_AMBULATORY_CARE_PROVIDER_SITE_OTHER): Payer: Medicare Other

## 2023-05-15 ENCOUNTER — Ambulatory Visit: Payer: Medicare Other | Admitting: Podiatry

## 2023-05-15 VITALS — Ht 60.0 in | Wt 135.0 lb

## 2023-05-15 DIAGNOSIS — S90122A Contusion of left lesser toe(s) without damage to nail, initial encounter: Secondary | ICD-10-CM

## 2023-05-15 DIAGNOSIS — D2372 Other benign neoplasm of skin of left lower limb, including hip: Secondary | ICD-10-CM | POA: Diagnosis not present

## 2023-05-15 NOTE — Progress Notes (Signed)
Chief Complaint  Patient presents with   Callouses    Patient is here for callous on bottom of left foot and swollen 3rd toe    Subjective: 77 y.o. female presenting to the office today for evaluation of a symptomatic callus to the left foot as well as pain and tenderness associated to the left third toe.  Onset about 2 weeks ago.  Denies any history of injury.  She says that she noticed her left third toe swelling and painful.  Over the last 2 weeks it has improved but there continues to be some sensitivity.  Presenting for further treatment and evaluation   Past Medical History:  Diagnosis Date   Allergic rhinitis    Allergy    Bronchitis    more frequent in the past   Cataract    "suspect per pt"   Colon polyps    history of   GERD (gastroesophageal reflux disease)    Glaucoma    suspected   Heart murmur    History of kidney stones    suspects that she had one and past it   Hyperlipidemia    Hypertension    Orthodontics    Invisalign   Osteoarthritis of knee    Osteopenia    PONV (postoperative nausea and vomiting)    with tubal ligation years ago     Objective:  Physical Exam General: Alert and oriented x3 in no acute distress  Dermatology: Hyperkeratotic lesion(s) present on the left foot. Pain on palpation with a central nucleated core noted. Skin is warm, dry and supple bilateral lower extremities. Negative for open lesions or macerations.  Vascular: Palpable pedal pulses bilaterally. No edema or erythema noted. Capillary refill within normal limits.  Neurological: Grossly intact via light touch  Musculoskeletal Exam: Pain on palpation at the keratotic lesion(s) noted. Range of motion within normal limits bilateral. Muscle strength 5/5 in all groups bilateral.  Clinical evidence of hallux limitus to the left foot with recurrence of bunion deformities to the bilateral great toes  There is also some tenderness with palpation specifically to the third toe of  the left foot  Radiographic exam LT foot 05/15/2023: Advanced severe degenerative changes noted to the first MTP.  K wire fixation noted within the distal portion of the metatarsal which appears uncomplicated. Transverse fracture noted across the diaphysis of the proximal phalanx of the left third toe.  Near anatomical alignment.  Nondisplaced.  Assessment: 1.  Eccrine neuroma left foot  2.  Recurrent bunion deformities with hallux limitus left hallux 3.  Fracture left third toe; closed, nondisplaced, initial encounter   Plan of Care:  -Patient evaluated.  X-rays reviewed revealing fracture of the third digit of the left foot -Patient ultimately declined a postsurgical shoe -RICE.  Also recommend reduced activity over the following 4 to 6 weeks to allow the toe fracture to heal -Excisional debridement of keratoic lesion(s) using a chisel blade was performed without incident.  Salicylic acid applied.  Salicylic acid with a light dressing was applied -Continue wearing good supportive shoes and sneakers -Return to clinic 6 weeks follow-up x-ray  *Very active.  Retired from [employer] full-time.  Also retired from [employer] for 40+ years.  Currently substitute teaching [employer]  Felecia Shelling, DPM [facility]  Dr. Felecia Shelling, DPM    [address] [place] [address]  [place], [place] [address]                Office [phone]  Fax [phone]

## 2023-05-18 DIAGNOSIS — J3089 Other allergic rhinitis: Secondary | ICD-10-CM | POA: Diagnosis not present

## 2023-05-18 DIAGNOSIS — J3081 Allergic rhinitis due to animal (cat) (dog) hair and dander: Secondary | ICD-10-CM | POA: Diagnosis not present

## 2023-05-18 DIAGNOSIS — J301 Allergic rhinitis due to pollen: Secondary | ICD-10-CM | POA: Diagnosis not present

## 2023-05-25 ENCOUNTER — Encounter: Payer: Self-pay | Admitting: Family Medicine

## 2023-05-25 ENCOUNTER — Ambulatory Visit (INDEPENDENT_AMBULATORY_CARE_PROVIDER_SITE_OTHER): Payer: Medicare Other | Admitting: Family Medicine

## 2023-05-25 VITALS — BP 124/70 | HR 75 | Temp 98.3°F | Ht 60.0 in | Wt 136.5 lb

## 2023-05-25 DIAGNOSIS — E278 Other specified disorders of adrenal gland: Secondary | ICD-10-CM | POA: Diagnosis not present

## 2023-05-25 DIAGNOSIS — J3081 Allergic rhinitis due to animal (cat) (dog) hair and dander: Secondary | ICD-10-CM | POA: Diagnosis not present

## 2023-05-25 DIAGNOSIS — R931 Abnormal findings on diagnostic imaging of heart and coronary circulation: Secondary | ICD-10-CM

## 2023-05-25 DIAGNOSIS — I10 Essential (primary) hypertension: Secondary | ICD-10-CM

## 2023-05-25 DIAGNOSIS — Z0000 Encounter for general adult medical examination without abnormal findings: Secondary | ICD-10-CM | POA: Diagnosis not present

## 2023-05-25 DIAGNOSIS — Z860100 Personal history of colon polyps, unspecified: Secondary | ICD-10-CM | POA: Diagnosis not present

## 2023-05-25 DIAGNOSIS — E782 Mixed hyperlipidemia: Secondary | ICD-10-CM

## 2023-05-25 DIAGNOSIS — J3089 Other allergic rhinitis: Secondary | ICD-10-CM | POA: Diagnosis not present

## 2023-05-25 DIAGNOSIS — K219 Gastro-esophageal reflux disease without esophagitis: Secondary | ICD-10-CM | POA: Diagnosis not present

## 2023-05-25 DIAGNOSIS — Z79899 Other long term (current) drug therapy: Secondary | ICD-10-CM

## 2023-05-25 DIAGNOSIS — J301 Allergic rhinitis due to pollen: Secondary | ICD-10-CM | POA: Diagnosis not present

## 2023-05-25 DIAGNOSIS — M810 Age-related osteoporosis without current pathological fracture: Secondary | ICD-10-CM | POA: Diagnosis not present

## 2023-05-25 MED ORDER — POTASSIUM CHLORIDE ER 10 MEQ PO TBCR: EXTENDED_RELEASE_TABLET | ORAL | 3 refills | Status: DC

## 2023-05-25 MED ORDER — TRIAMTERENE-HCTZ 37.5-25 MG PO TABS: 1.0000 | ORAL_TABLET | Freq: Every day | ORAL | 3 refills | Status: DC

## 2023-05-25 NOTE — Patient Instructions (Addendum)
If you are interested in the new shingles vaccine (Shingrix) - call your local pharmacy to check on coverage and availability   Read about alendronate for bone density  If interested let me know   Watch diet best you can  Avoid red meat/ fried foods/ egg yolks/ fatty breakfast meats/ butter, cheese and high fat dairy/ and shellfish    Take care of yourself   I will put an order in for CT scan to look at adrenal nodule in /   Please let us know if you don't hear in 1-2 weeks

## 2023-05-25 NOTE — Progress Notes (Unsigned)
Subjective:    Patient ID: Amy Herman, female    DOB: 1946-01-09, 77 y.o.   MRN: [id number]  HPI  Here for health maintenance exam and to review chronic medical problems   Wt Readings from Last 3 Encounters:  05/25/23 136 lb 8 oz (61.9 kg)  05/15/23 135 lb (61.2 kg)  04/11/22 135 lb 4 oz (61.3 kg)   26.66 kg/m  Vitals:   05/25/23 1521  BP: 124/70  Pulse: 75  Temp: 98.3 F (36.8 C)  SpO2: 98%    Immunization History  Administered Date(s) Administered   Fluad Quad(high Dose 65+) 04/10/2019, 04/24/2020, 04/11/2022   Fluad Trivalent(High Dose 65+) 05/14/2023   Influenza Split 06/08/2011   Influenza Whole 05/03/2009   Influenza, High Dose Seasonal PF 08/05/2013, 08/11/2016, 04/19/2021   Influenza,inj,Quad PF,6+ Mos 04/10/2013, 06/03/2014, 07/29/2015, 05/08/2016, 03/29/2018   Influenza-Unspecified 04/16/2017   Moderna Sars-Covid-2 Vaccination 08/16/2019, 09/13/2019, 06/29/2020   PFIZER(Purple Top)SARS-COV-2 Vaccination 12/10/2020   Pneumococcal Conjugate-13 08/12/2015   Pneumococcal Polysaccharide-23 07/03/2004, 01/31/2011, 08/05/2013, 11/18/2015, 11/09/2016, 11/08/2017, 12/11/2019, 02/10/2021   Td 07/23/2006   Tdap 07/24/2017   Zoster, Live 06/13/2007     There are no preventive care reminders to display for this patient.   Has a cold  Some cough  Working and working out-feels ok    Shingrix- is interested / will get over holidays    Mammogram- due 01/2024  Self breast exam- no lumps   Gyn health-no problems    Colon cancer screening -had colonoscopy 07/2017    no repeat due to age   Bone health  Dexa - 12/2022  osteoporosis in forarm  Falls- none  Fractures-none Supplements -vit D Last vitamin D Lab Results  Component Value Date   VD25OH 62.18 05/14/2023    Exercise  Cycling  Strength training  Getting more muscle   No dermatologist currently  Not a lot of moles    Mood    09/19/2021    2:50 PM 08/03/2020    3:51 PM 05/09/2019     3:20 PM 03/29/2018   10:37 AM 03/29/2018   10:26 AM  Depression screen PHQ 2/9  Decreased Interest 0 0 0 0 0  Down, Depressed, Hopeless 0 0 0 0 0  PHQ - 2 Score 0 0 0 0 0  Altered sleeping  0     Tired, decreased energy  0     Change in appetite  0     Feeling bad or failure about yourself   0     Trouble concentrating  0     Moving slowly or fidgety/restless  0     Suicidal thoughts  0     PHQ-9 Score  0     Difficult doing work/chores  Not difficult at all       HTN bp is stable today  No cp or palpitations or headaches or edema  No side effects to medicines  BP Readings from Last 3 Encounters:  05/25/23 124/70  04/11/22 122/74  02/16/22 139/71    Triam hct 37.5-25 mg daily   Lab Results  Component Value Date   NA 141 05/14/2023   K 3.9 05/14/2023   CO2 29 05/14/2023   GLUCOSE 88 05/14/2023   BUN 15 05/14/2023   CREATININE 0.95 05/14/2023   CALCIUM 9.7 05/14/2023   GFR 57.49 (L) 05/14/2023   GFRNONAA >60 11/29/2021   Had adrenal mass on CT 03/2022  Report IMPRESSION: 1. We partially include a nonspecific left adrenal mass  measuring 1.1 by 1.6 cm, internal density 37 Hounsfield units. The lesion is likely benign, consider 12 month follow up adrenal CT. This recommendation follows ACR consensus guidelines: Management of Incidental Adrenal Masses: A White Paper of the ACR Incidental Findings Committee. J Am Coll Radiol 2017;14:1038-1044. 2. Small fluid density lesions of the liver and right kidney upper pole appear benign and require no further workup. 3. Aortic Atherosclerosis (ICD10-I70.0). 4. Thoracic spondylosis.    Ppi Omeprazole Lab Results  Component Value Date   VITAMINB12 631 05/14/2023   Hyperlipidemia Lab Results  Component Value Date   CHOL 213 (H) 05/14/2023   CHOL 223 (H) 06/23/2022   CHOL 179 04/11/2022   Lab Results  Component Value Date   HDL 68.20 05/14/2023   HDL 67.60 06/23/2022   HDL 58.20 04/11/2022   Lab Results  Component  Value Date   LDLCALC 133 (H) 05/14/2023   LDLCALC 140 (H) 06/23/2022   LDLCALC 105 (H) 04/11/2022   Lab Results  Component Value Date   TRIG 62.0 05/14/2023   TRIG 78.0 06/23/2022   TRIG 75.0 04/11/2022   Lab Results  Component Value Date   CHOLHDL 3 05/14/2023   CHOLHDL 3 06/23/2022   CHOLHDL 3 04/11/2022   No results found for: "LDLDIRECT" Zetia 10 mg daily - no longer taking, diarrhea was side effects  Intol of statin   The 10-year ASCVD risk score (Arnett DK, et al., 2019) is: 27.4%   Values used to calculate the score:     Age: 58 years     Sex: Female     Is Non-Hispanic African American: No     Diabetic: No     Tobacco smoker: No     Systolic Blood Pressure: 124 mmHg     Is BP treated: Yes     HDL Cholesterol: 68.2 mg/dL     Total Cholesterol: 213 mg/dL   Lab Results  Component Value Date   ALT 19 05/14/2023   AST 24 05/14/2023   ALKPHOS 62 05/14/2023   BILITOT 0.6 05/14/2023    Lab Results  Component Value Date   WBC 4.5 05/14/2023   HGB 13.3 05/14/2023   HCT 39.9 05/14/2023   MCV 91.0 05/14/2023   PLT 300.0 05/14/2023   Lab Results  Component Value Date   TSH 1.27 05/14/2023     Patient Active Problem List   Diagnosis Date Noted   Elevated coronary artery calcium score 05/27/2023   Current use of proton pump inhibitor 05/10/2023   Routine general medical examination at a health care facility 04/11/2022   Colon cancer screening 04/11/2022   Adrenal mass (HCC) 03/27/2022   Total knee replacement status 12/12/2021   Allergic rhinitis due to animal (cat) (dog) hair and dander 12/11/2021   Allergic rhinitis 09/16/2020   Allergic rhinitis due to pollen 08/15/2020   Statin myopathy 08/10/2020   Left knee pain 12/13/2017   Primary osteoarthritis of left knee 12/13/2017   History of UTI 11/11/2017   Encounter for screening mammogram for breast cancer 05/08/2016   Estrogen deficiency 02/16/2016   Pre-operative cardiovascular examination  07/21/2013   GERD (gastroesophageal reflux disease) 06/18/2013   Varicose veins of bilateral lower extremities with other complications 07/23/2012   Lumbar disc disease 08/15/2011   Joint pain 08/15/2011   Osteoporosis 01/31/2011   Post-menopausal 01/31/2011   Hypokalemia 01/31/2011   Hyperlipidemia 03/24/2008   Essential hypertension, benign 03/24/2008   History of colonic polyps 03/24/2008   Past Medical History:  Diagnosis  Date   Allergic rhinitis    Allergy    Bronchitis    more frequent in the past   Cataract    "suspect per pt"   Colon polyps    history of   GERD (gastroesophageal reflux disease)    Glaucoma    suspected   Heart murmur    History of kidney stones    suspects that she had one and past it   Hyperlipidemia    Hypertension    Orthodontics    Invisalign   Osteoarthritis of knee    Osteopenia    PONV (postoperative nausea and vomiting)    with tubal ligation years ago   Past Surgical History:  Procedure Laterality Date   BROW LIFT Bilateral 02/16/2022   Procedure: BLEPHAROPLASTY UPPER EYELID; W/EXCESS SKIN BILATERAL BLEPHAROPTOSIS REPAIR; RESECT EX BILATERAL;  Surgeon: Imagene Riches, MD;  Location: [facility];  Service: Ophthalmology;  Laterality: Bilateral;   BUNIONECTOMY     bilateral-PIN LEFT GREAT TOE   CARPAL TUNNEL RELEASE     COLONOSCOPY     COLONOSCOPY W/ POLYPECTOMY     FUNCTIONAL ENDOSCOPIC SINUS SURGERY     05-04-2011   HYSTERECTOMY     only removed uterus   KNEE ARTHROPLASTY Right 12/12/2021   Procedure: COMPUTER ASSISTED TOTAL KNEE ARTHROPLASTY;  Surgeon: Donato Heinz, MD;  Location: [facility];  Service: Orthopedics;  Laterality: Right;   KNEE ARTHROSCOPY     right   TUBAL LIGATION     bilateral   VEIN SURGERY  2006   legs   Social History   Tobacco Use   Smoking status: Never   Smokeless tobacco: Never   Tobacco comments:    used to have secondary smoke exposure from husband  Vaping Use   Vaping status: Never  Used  Substance Use Topics   Alcohol use: No    Alcohol/week: 0.0 standard drinks of alcohol   Drug use: No   Family History  Problem Relation Age of Onset   Cancer Mother        colon, lungs, liver   Colon cancer Mother 83   Cancer Father        prostate   Heart disease Father        A-fib   Stroke Father        TIA's   Alcohol abuse Brother    Cancer Brother        pancreatic, smoker   Diabetes Brother    Diabetes Brother    Stomach cancer Neg Hx    Esophageal cancer Neg Hx    Rectal cancer Neg Hx    Breast cancer Neg Hx    Allergies  Allergen Reactions   Timoptic [Timolol] Shortness Of Breath    "Mild"   Alphagan [Brimonidine] Swelling    Iritis   Cefuroxime Axetil     REACTION: bumps on tounge   Cefuroxime Axetil Other (See Comments)    TOLERATED CEFAZOLIN Other reaction(s): RASH REACTION: bumps on tounge   Clarithromycin Other (See Comments)    Other reaction(s): OTHER REACTION: GI Biaxin    Levofloxacin Other (See Comments)    Joint pains   Lipitor [Atorvastatin] Other (See Comments)    Muscle pain   Losartan Other (See Comments)    cough   Other     Other reaction(s): Unknown   Rosuvastatin Other (See Comments)    REACTION: severe muscle pain    Valsartan     Taken off from MD  d/t a recall   Zetia [Ezetimibe] Diarrhea and Other (See Comments)    Stomach cramping   Current Outpatient Medications on File Prior to Visit  Medication Sig Dispense Refill   acetaminophen (TYLENOL) 500 MG tablet Take 500 mg by mouth every 8 (eight) hours as needed for moderate pain.     albuterol (VENTOLIN HFA) 108 (90 Base) MCG/ACT inhaler Inhale 1 puff into the lungs every 6 (six) hours as needed for wheezing or shortness of breath.     ascorbic acid (VITAMIN C) 500 MG tablet Take 500 mg by mouth daily.     ASPIRIN 81 PO Take by mouth daily.     bimatoprost (LUMIGAN) 0.01 % SOLN Place 1 drop into both eyes at bedtime.     calcium citrate-vitamin D (CITRACAL+D)  315-200 MG-UNIT per tablet Take 1 tablet by mouth daily.     cetirizine (ZYRTEC) 10 MG tablet Take 10 mg by mouth daily.     Cholecalciferol (VITAMIN D3) 125 MCG (5000 UT) CAPS Take 1,000 Units by mouth daily.     EPINEPHrine 0.3 mg/0.3 mL IJ SOAJ injection Inject 0.3 mg into the muscle as needed for anaphylaxis.     erythromycin ophthalmic ointment Apply to sutures 4 times a day for 10-12 days.  Discontinue if allergy develops and call our office 3.5 g 2   fluticasone (FLONASE) 50 MCG/ACT nasal spray Place 2 sprays into both nostrils as needed.     Garlic 1000 MG CAPS Take 1,000 mg by mouth daily.     montelukast (SINGULAIR) 10 MG tablet Take 10 mg by mouth at bedtime as needed (allergies).     Omega 3 1000 MG CAPS Take 1,000 mg by mouth daily.     omeprazole (PRILOSEC) 20 MG capsule Take 20 mg by mouth daily as needed (acid reflux).     Sodium Chloride-Sodium Bicarb (SINUS WASH NETI POT NA) Place 1 application. into the nose as needed.     traMADol (ULTRAM) 50 MG tablet Take 1 every 4-6 hours as needed for pain not controlled by Tylenol 6 tablet 0   No current facility-administered medications on file prior to visit.    Review of Systems  Constitutional:  Negative for activity change, appetite change, fatigue, fever and unexpected weight change.  HENT:  Negative for congestion, ear pain, rhinorrhea, sinus pressure and sore throat.   Eyes:  Negative for pain, redness and visual disturbance.  Respiratory:  Negative for cough, shortness of breath and wheezing.   Cardiovascular:  Negative for chest pain and palpitations.  Gastrointestinal:  Negative for abdominal pain, blood in stool, constipation and diarrhea.  Endocrine: Negative for polydipsia and polyuria.  Genitourinary:  Negative for dysuria, frequency and urgency.  Musculoskeletal:  Negative for arthralgias, back pain and myalgias.  Skin:  Negative for pallor and rash.  Allergic/Immunologic: Negative for environmental allergies.   Neurological:  Negative for dizziness, syncope and headaches.  Hematological:  Negative for adenopathy. Does not bruise/bleed easily.  Psychiatric/Behavioral:  Negative for decreased concentration and dysphoric mood. The patient is not nervous/anxious.        Objective:   Physical Exam Constitutional:      General: She is not in acute distress.    Appearance: Normal appearance. She is well-developed and normal weight. She is not ill-appearing or diaphoretic.  HENT:     Head: Normocephalic and atraumatic.     Right Ear: Tympanic membrane, ear canal and external ear normal.     Left Ear: Tympanic membrane, ear  canal and external ear normal.     Nose: Nose normal. No congestion.     Mouth/Throat:     Mouth: Mucous membranes are moist.     Pharynx: Oropharynx is clear. No posterior oropharyngeal erythema.  Eyes:     General: No scleral icterus.    Extraocular Movements: Extraocular movements intact.     Conjunctiva/sclera: Conjunctivae normal.     Pupils: Pupils are equal, round, and reactive to light.  Neck:     Thyroid: No thyromegaly.     Vascular: No carotid bruit or JVD.  Cardiovascular:     Rate and Rhythm: Normal rate and regular rhythm.     Pulses: Normal pulses.     Heart sounds: Normal heart sounds.     No gallop.  Pulmonary:     Effort: Pulmonary effort is normal. No respiratory distress.     Breath sounds: Normal breath sounds. No wheezing.     Comments: Good air exch Chest:     Chest wall: No tenderness.  Abdominal:     General: Bowel sounds are normal. There is no distension or abdominal bruit.     Palpations: Abdomen is soft. There is no mass.     Tenderness: There is no abdominal tenderness.     Hernia: No hernia is present.  Genitourinary:    Comments: Breast exam: No mass, nodules, thickening, tenderness, bulging, retraction, inflamation, nipple discharge or skin changes noted.  No axillary or clavicular LA.     Musculoskeletal:        General: No  tenderness. Normal range of motion.     Cervical back: Normal range of motion and neck supple. No rigidity. No muscular tenderness.     Right lower leg: No edema.     Left lower leg: No edema.     Comments: No kyphosis   Lymphadenopathy:     Cervical: No cervical adenopathy.  Skin:    General: Skin is warm and dry.     Coloration: Skin is not pale.     Findings: No erythema or rash.     Comments: Solar lentigines diffusely   Neurological:     Mental Status: She is alert. Mental status is at baseline.     Cranial Nerves: No cranial nerve deficit.     Motor: No abnormal muscle tone.     Coordination: Coordination normal.     Gait: Gait normal.     Deep Tendon Reflexes: Reflexes are normal and symmetric.  Psychiatric:        Mood and Affect: Mood normal.        Cognition and Memory: Cognition and memory normal.           Assessment & Plan:   Problem List Items Addressed This Visit       Cardiovascular and Mediastinum   Essential hypertension, benign    bp in fair control at this time  BP Readings from Last 1 Encounters:  05/25/23 124/70   No changes needed Most recent labs reviewed  Disc lifstyle change with low sodium diet and exercise  triam hct 37.5-25 mg daily = will continue        Relevant Medications   triamterene-hydrochlorothiazide (MAXZIDE-25) 37.5-25 MG tablet   Elevated coronary artery calcium score    Sees cardiology  Intol of statin and zetia       Relevant Medications   triamterene-hydrochlorothiazide (MAXZIDE-25) 37.5-25 MG tablet     Digestive   GERD (gastroesophageal reflux disease)    Takes  omeprzaole 20 mg prn  Normal B12 and D levels Aware of osteoporosis Encouraged to watch diet         Musculoskeletal and Integument   Osteoporosis    Dexa 12/2022 No falls or fracture  D level in normal range  Discussed fall prevention, supplements and exercise for bone density  Great habits   Given info on alendronate to read  She will  call if interested in starting it         Other   Routine general medical examination at a health care facility - Primary    Reviewed health habits including diet and exercise and skin cancer prevention Reviewed appropriate screening tests for age  Also reviewed health mt list, fam hx and immunization status , as well as social and family history   See HPI Labs reviewed and ordered Interested in shingrix at pharmacy  Discussed fall prevention, supplements and exercise for bone density  PHQ 0  Health Maintenance  Topic Date Due   Zoster (Shingles) Vaccine (1 of 2) 08/25/2023*   COVID-19 Vaccine (5 - 2023-24 season) 06/09/2025*   Mammogram  01/01/2024   Medicare Annual Wellness Visit  05/24/2024   DEXA scan (bone density measurement)  12/27/2024   DTaP/Tdap/Td vaccine (3 - Td or Tdap) 07/25/2027   Pneumonia Vaccine  Completed   Flu Shot  Completed   Hepatitis C Screening  Completed   HPV Vaccine  Aged Out   Colon Cancer Screening  Discontinued  *Topic was postponed. The date shown is not the original due date.         Hyperlipidemia    Disc goals for lipids and reasons to control them Rev last labs with pt Rev low sat fat diet in detail  Disc goals for lipids and reasons to control them Rev last labs with pt Rev low sat fat diet in detail LDL of 133 with diet  Intol of statins and also zetia   Cardiac calcium score in 66%ile Has seen cardiology  Has discussed pcyk9      Relevant Medications   triamterene-hydrochlorothiazide (MAXZIDE-25) 37.5-25 MG tablet   History of colonic polyps    Colonoscopy 07/2017 No recall due to age       Current use of proton pump inhibitor    Lab Results  Component Value Date   VITAMINB12 631 05/14/2023   Last vitamin D Lab Results  Component Value Date   VD25OH 62.18 05/14/2023         Adrenal mass (HCC)    Due for re check  Left 1.6 cm Was incidental finding on CT 03/2022  Ordered in [place]        Relevant  Orders   CT ADRENAL ABDOMEN WO CONTRAST

## 2023-05-27 DIAGNOSIS — R931 Abnormal findings on diagnostic imaging of heart and coronary circulation: Secondary | ICD-10-CM | POA: Insufficient documentation

## 2023-05-27 NOTE — Assessment & Plan Note (Signed)
Takes omeprzaole 20 mg prn  Normal B12 and D levels Aware of osteoporosis Encouraged to watch diet

## 2023-05-27 NOTE — Assessment & Plan Note (Addendum)
Due for re check  Left 1.6 cm Was incidental finding on CT 03/2022  Ordered in [place]

## 2023-05-27 NOTE — Assessment & Plan Note (Signed)
Sees cardiology  Intol of statin and zetia

## 2023-05-27 NOTE — Assessment & Plan Note (Signed)
bp in fair control at this time  BP Readings from Last 1 Encounters:  05/25/23 124/70   No changes needed Most recent labs reviewed  Disc lifstyle change with low sodium diet and exercise  triam hct 37.5-25 mg daily = will continue

## 2023-05-27 NOTE — Assessment & Plan Note (Signed)
Disc goals for lipids and reasons to control them Rev last labs with pt Rev low sat fat diet in detail  Disc goals for lipids and reasons to control them Rev last labs with pt Rev low sat fat diet in detail LDL of 133 with diet  Intol of statins and also zetia   Cardiac calcium score in 66%ile Has seen cardiology  Has discussed pcyk9

## 2023-05-27 NOTE — Assessment & Plan Note (Signed)
Lab Results  Component Value Date   VITAMINB12 631 05/14/2023   Last vitamin D Lab Results  Component Value Date   VD25OH 62.18 05/14/2023

## 2023-05-27 NOTE — Assessment & Plan Note (Signed)
Dexa 12/2022 No falls or fracture  D level in normal range  Discussed fall prevention, supplements and exercise for bone density  Great habits   Given info on alendronate to read  She will call if interested in starting it

## 2023-05-27 NOTE — Assessment & Plan Note (Signed)
Reviewed health habits including diet and exercise and skin cancer prevention Reviewed appropriate screening tests for age  Also reviewed health mt list, fam hx and immunization status , as well as social and family history   See HPI Labs reviewed and ordered Interested in shingrix at pharmacy  Discussed fall prevention, supplements and exercise for bone density  PHQ 0  Health Maintenance  Topic Date Due   Zoster (Shingles) Vaccine (1 of 2) 08/25/2023*   COVID-19 Vaccine (5 - 2023-24 season) 06/09/2025*   Mammogram  01/01/2024   Medicare Annual Wellness Visit  05/24/2024   DEXA scan (bone density measurement)  12/27/2024   DTaP/Tdap/Td vaccine (3 - Td or Tdap) 07/25/2027   Pneumonia Vaccine  Completed   Flu Shot  Completed   Hepatitis C Screening  Completed   HPV Vaccine  Aged Out   Colon Cancer Screening  Discontinued  *Topic was postponed. The date shown is not the original due date.

## 2023-05-27 NOTE — Assessment & Plan Note (Signed)
Colonoscopy 07/2017 No recall due to age

## 2023-05-29 ENCOUNTER — Encounter: Payer: Self-pay | Admitting: *Deleted

## 2023-06-01 DIAGNOSIS — J209 Acute bronchitis, unspecified: Secondary | ICD-10-CM | POA: Diagnosis not present

## 2023-06-04 ENCOUNTER — Telehealth: Payer: Self-pay | Admitting: Family Medicine

## 2023-06-04 NOTE — Telephone Encounter (Signed)
[facility] contacted the office regarding referral for CT Abdomen Adrenal WO Contrast. States that this will need to be corrected to "CT Abdomen Adrenal With and W/O Contrast" please advise

## 2023-06-06 NOTE — Telephone Encounter (Signed)
[facility] called back. They said the CT order is fine and w/o contrast is okay but if they need the order changed for any reason they will call back

## 2023-06-08 DIAGNOSIS — J3089 Other allergic rhinitis: Secondary | ICD-10-CM | POA: Diagnosis not present

## 2023-06-08 DIAGNOSIS — J3081 Allergic rhinitis due to animal (cat) (dog) hair and dander: Secondary | ICD-10-CM | POA: Diagnosis not present

## 2023-06-08 DIAGNOSIS — J301 Allergic rhinitis due to pollen: Secondary | ICD-10-CM | POA: Diagnosis not present

## 2023-06-15 DIAGNOSIS — J3089 Other allergic rhinitis: Secondary | ICD-10-CM | POA: Diagnosis not present

## 2023-06-15 DIAGNOSIS — J3081 Allergic rhinitis due to animal (cat) (dog) hair and dander: Secondary | ICD-10-CM | POA: Diagnosis not present

## 2023-06-15 DIAGNOSIS — J301 Allergic rhinitis due to pollen: Secondary | ICD-10-CM | POA: Diagnosis not present

## 2023-06-20 DIAGNOSIS — J3089 Other allergic rhinitis: Secondary | ICD-10-CM | POA: Diagnosis not present

## 2023-06-21 DIAGNOSIS — J3089 Other allergic rhinitis: Secondary | ICD-10-CM | POA: Diagnosis not present

## 2023-06-25 ENCOUNTER — Ambulatory Visit
Admission: RE | Admit: 2023-06-25 | Discharge: 2023-06-25 | Disposition: A | Discharge status: Home / Self Care | Payer: Medicare Other | Source: Ambulatory Visit | Attending: Family Medicine | Admitting: Family Medicine

## 2023-06-25 DIAGNOSIS — E278 Other specified disorders of adrenal gland: Secondary | ICD-10-CM

## 2023-06-25 DIAGNOSIS — D3502 Benign neoplasm of left adrenal gland: Secondary | ICD-10-CM | POA: Diagnosis not present

## 2023-06-25 DIAGNOSIS — N281 Cyst of kidney, acquired: Secondary | ICD-10-CM | POA: Diagnosis not present

## 2023-07-05 DIAGNOSIS — J3089 Other allergic rhinitis: Secondary | ICD-10-CM | POA: Diagnosis not present

## 2023-07-05 DIAGNOSIS — J301 Allergic rhinitis due to pollen: Secondary | ICD-10-CM | POA: Diagnosis not present

## 2023-07-05 DIAGNOSIS — J3081 Allergic rhinitis due to animal (cat) (dog) hair and dander: Secondary | ICD-10-CM | POA: Diagnosis not present

## 2023-07-10 DIAGNOSIS — J301 Allergic rhinitis due to pollen: Secondary | ICD-10-CM | POA: Diagnosis not present

## 2023-07-10 DIAGNOSIS — J3089 Other allergic rhinitis: Secondary | ICD-10-CM | POA: Diagnosis not present

## 2023-07-10 DIAGNOSIS — R052 Subacute cough: Secondary | ICD-10-CM | POA: Diagnosis not present

## 2023-07-10 DIAGNOSIS — J3081 Allergic rhinitis due to animal (cat) (dog) hair and dander: Secondary | ICD-10-CM | POA: Diagnosis not present

## 2023-07-27 DIAGNOSIS — J301 Allergic rhinitis due to pollen: Secondary | ICD-10-CM | POA: Diagnosis not present

## 2023-07-27 DIAGNOSIS — J3089 Other allergic rhinitis: Secondary | ICD-10-CM | POA: Diagnosis not present

## 2023-07-27 DIAGNOSIS — J3081 Allergic rhinitis due to animal (cat) (dog) hair and dander: Secondary | ICD-10-CM | POA: Diagnosis not present

## 2023-08-03 DIAGNOSIS — J3081 Allergic rhinitis due to animal (cat) (dog) hair and dander: Secondary | ICD-10-CM | POA: Diagnosis not present

## 2023-08-03 DIAGNOSIS — J3089 Other allergic rhinitis: Secondary | ICD-10-CM | POA: Diagnosis not present

## 2023-08-03 DIAGNOSIS — J301 Allergic rhinitis due to pollen: Secondary | ICD-10-CM | POA: Diagnosis not present

## 2023-08-09 DIAGNOSIS — J3089 Other allergic rhinitis: Secondary | ICD-10-CM | POA: Diagnosis not present

## 2023-08-09 DIAGNOSIS — J301 Allergic rhinitis due to pollen: Secondary | ICD-10-CM | POA: Diagnosis not present

## 2023-08-09 DIAGNOSIS — J3081 Allergic rhinitis due to animal (cat) (dog) hair and dander: Secondary | ICD-10-CM | POA: Diagnosis not present

## 2023-08-10 DIAGNOSIS — H401131 Primary open-angle glaucoma, bilateral, mild stage: Secondary | ICD-10-CM | POA: Diagnosis not present

## 2023-08-14 DIAGNOSIS — H401131 Primary open-angle glaucoma, bilateral, mild stage: Secondary | ICD-10-CM | POA: Diagnosis not present

## 2023-08-14 DIAGNOSIS — H2513 Age-related nuclear cataract, bilateral: Secondary | ICD-10-CM | POA: Diagnosis not present

## 2023-08-17 DIAGNOSIS — J3089 Other allergic rhinitis: Secondary | ICD-10-CM | POA: Diagnosis not present

## 2023-08-17 DIAGNOSIS — J3081 Allergic rhinitis due to animal (cat) (dog) hair and dander: Secondary | ICD-10-CM | POA: Diagnosis not present

## 2023-08-17 DIAGNOSIS — J301 Allergic rhinitis due to pollen: Secondary | ICD-10-CM | POA: Diagnosis not present

## 2023-08-23 NOTE — Telephone Encounter (Signed)
Pt completed CT with [facility] [place] in Dec 2024  Nothing further needed.

## 2023-08-24 DIAGNOSIS — J3089 Other allergic rhinitis: Secondary | ICD-10-CM | POA: Diagnosis not present

## 2023-08-24 DIAGNOSIS — J3081 Allergic rhinitis due to animal (cat) (dog) hair and dander: Secondary | ICD-10-CM | POA: Diagnosis not present

## 2023-08-24 DIAGNOSIS — J301 Allergic rhinitis due to pollen: Secondary | ICD-10-CM | POA: Diagnosis not present

## 2023-09-03 DIAGNOSIS — J320 Chronic maxillary sinusitis: Secondary | ICD-10-CM | POA: Diagnosis not present

## 2023-09-13 ENCOUNTER — Encounter: Payer: Self-pay | Admitting: Dermatology

## 2023-09-13 ENCOUNTER — Ambulatory Visit: Payer: Self-pay | Admitting: Dermatology

## 2023-09-13 DIAGNOSIS — H02831 Dermatochalasis of right upper eyelid: Secondary | ICD-10-CM

## 2023-09-13 DIAGNOSIS — H02832 Dermatochalasis of right lower eyelid: Secondary | ICD-10-CM

## 2023-09-13 DIAGNOSIS — H02834 Dermatochalasis of left upper eyelid: Secondary | ICD-10-CM

## 2023-09-13 DIAGNOSIS — J3089 Other allergic rhinitis: Secondary | ICD-10-CM | POA: Diagnosis not present

## 2023-09-13 DIAGNOSIS — H02835 Dermatochalasis of left lower eyelid: Secondary | ICD-10-CM

## 2023-09-13 DIAGNOSIS — L988 Other specified disorders of the skin and subcutaneous tissue: Secondary | ICD-10-CM

## 2023-09-13 NOTE — Progress Notes (Signed)
   New Patient Visit   Subjective  Amy Herman is a 78 y.o. female who presents for the following: Patient here to discuss cosmetic procedures for facial elastosis  The following portions of the chart were reviewed this encounter and updated as appropriate: medications, allergies, medical history  Review of Systems:  No other skin or systemic complaints except as noted in HPI or Assessment and Plan.  Objective  Well appearing patient in no apparent distress; mood and affect are within normal limits.  A focused examination was performed of the face.  Relevant physical exam findings are noted in the Assessment and Plan.            Assessment & Plan   ELASTOSIS OF SKIN   DERMATOCHALASIS OF UPPER AND LOWER EYELIDS OF BOTH EYES   Facial Elastosis Overall, patient looks very good for specially for her age.  She may not decide to do anything. Discussed treatment options, she would benefit more with a  face lift or she could try cheaper options like filler or Botox Also discussed blepharoplasty for the lower eyelids, patient had blepharoplasty on the upper eyelids in the past.  Left upper eyelid- droops slightly today.  Patient said this is something that is been going on for many years. Patient has had blepharoplasty in the past.  It was upper eyelid blepharoplasty bilaterally.  For fat frown complex and forehead lines recommend: Botox 27.5 units- frown complex 1.25 units x 2 - forehead  Total 30 units   For lower face: Recommend patient consider facelift.   If she does not want to pursue this we may get some results with midface nasolabial and oral commissure fillers and she can schedule filler with Dr Roseanne Reno if she desires to pursue this.  For eyelid dermatochalasis: Consider blepharoplasty. Since patient wears glasses and the glasses camouflage this area, she may not want to consider this unless there are some symptoms associated with her traumatic chalasis.  She  does not think she wants to pursue this at this time.  Patient will return in the next few weeks for Botox injections  Return if symptoms worsen or fail to improve.  IAngelique Holm, CMA, am acting as scribe for Armida Sans, MD .   Documentation: I have reviewed the above documentation for accuracy and completeness, and I agree with the above.  Armida Sans, MD

## 2023-09-13 NOTE — Patient Instructions (Signed)

## 2023-09-18 DIAGNOSIS — J3089 Other allergic rhinitis: Secondary | ICD-10-CM | POA: Diagnosis not present

## 2023-09-18 DIAGNOSIS — J3081 Allergic rhinitis due to animal (cat) (dog) hair and dander: Secondary | ICD-10-CM | POA: Diagnosis not present

## 2023-09-18 DIAGNOSIS — J301 Allergic rhinitis due to pollen: Secondary | ICD-10-CM | POA: Diagnosis not present

## 2023-09-28 DIAGNOSIS — J301 Allergic rhinitis due to pollen: Secondary | ICD-10-CM | POA: Diagnosis not present

## 2023-09-28 DIAGNOSIS — J3089 Other allergic rhinitis: Secondary | ICD-10-CM | POA: Diagnosis not present

## 2023-09-28 DIAGNOSIS — J3081 Allergic rhinitis due to animal (cat) (dog) hair and dander: Secondary | ICD-10-CM | POA: Diagnosis not present

## 2023-10-05 DIAGNOSIS — J301 Allergic rhinitis due to pollen: Secondary | ICD-10-CM | POA: Diagnosis not present

## 2023-10-05 DIAGNOSIS — J3089 Other allergic rhinitis: Secondary | ICD-10-CM | POA: Diagnosis not present

## 2023-10-05 DIAGNOSIS — J3081 Allergic rhinitis due to animal (cat) (dog) hair and dander: Secondary | ICD-10-CM | POA: Diagnosis not present

## 2023-10-12 DIAGNOSIS — J3089 Other allergic rhinitis: Secondary | ICD-10-CM | POA: Diagnosis not present

## 2023-10-12 DIAGNOSIS — J3081 Allergic rhinitis due to animal (cat) (dog) hair and dander: Secondary | ICD-10-CM | POA: Diagnosis not present

## 2023-10-12 DIAGNOSIS — J301 Allergic rhinitis due to pollen: Secondary | ICD-10-CM | POA: Diagnosis not present

## 2023-10-26 DIAGNOSIS — J3089 Other allergic rhinitis: Secondary | ICD-10-CM | POA: Diagnosis not present

## 2023-10-26 DIAGNOSIS — J3081 Allergic rhinitis due to animal (cat) (dog) hair and dander: Secondary | ICD-10-CM | POA: Diagnosis not present

## 2023-10-26 DIAGNOSIS — J301 Allergic rhinitis due to pollen: Secondary | ICD-10-CM | POA: Diagnosis not present

## 2023-10-31 ENCOUNTER — Ambulatory Visit (INDEPENDENT_AMBULATORY_CARE_PROVIDER_SITE_OTHER): Payer: Self-pay | Admitting: Dermatology

## 2023-10-31 DIAGNOSIS — L988 Other specified disorders of the skin and subcutaneous tissue: Secondary | ICD-10-CM

## 2023-10-31 NOTE — Patient Instructions (Signed)

## 2023-10-31 NOTE — Progress Notes (Signed)
   Follow-Up Visit   Subjective  Amy Herman is a 78 y.o. female who presents for the following: Botox for facial elastosis  The following portions of the chart were reviewed this encounter and updated as appropriate: medications, allergies, medical history  Review of Systems:  No other skin or systemic complaints except as noted in HPI or Assessment and Plan.  Objective  Well appearing patient in no apparent distress; mood and affect are within normal limits.  A focused examination was performed of the face.  Relevant physical exam findings are noted in the Assessment and Plan.      Assessment & Plan   ELASTOSIS OF SKIN   Facial Elastosis               Location: See attached image  Informed consent: Discussed risks (infection, pain, bleeding, bruising, swelling, allergic reaction, paralysis of nearby muscles, eyelid droop, double vision, neck weakness, difficulty breathing, headache, undesirable cosmetic result, and need for additional treatment) and benefits of the procedure, as well as the alternatives.  Informed consent was obtained.  Preparation: The area was cleansed with alcohol.  Procedure Details:  Botox was injected into the dermis with a 30-gauge needle. Pressure applied to any bleeding. Ice packs offered for swelling.  Lot Number:  [id number] Expiration:  08/2025  Total Units Injected:  27.5  Plan: Tylenol  may be used for headache.  Allow 2 weeks before returning to clinic for additional dosing as needed. Patient will call for any problems.  Return in about 4 weeks (around 11/28/2023) for Botox follow up.  Arlinda Lais, CMA, am acting as scribe for Celine Collard, MD .  Documentation: I have reviewed the above documentation for accuracy and completeness, and I agree with the above.  Celine Collard, MD

## 2023-11-01 ENCOUNTER — Encounter: Payer: Self-pay | Admitting: Dermatology

## 2023-11-02 DIAGNOSIS — J3089 Other allergic rhinitis: Secondary | ICD-10-CM | POA: Diagnosis not present

## 2023-11-02 DIAGNOSIS — J301 Allergic rhinitis due to pollen: Secondary | ICD-10-CM | POA: Diagnosis not present

## 2023-11-02 DIAGNOSIS — J3081 Allergic rhinitis due to animal (cat) (dog) hair and dander: Secondary | ICD-10-CM | POA: Diagnosis not present

## 2023-11-09 DIAGNOSIS — J301 Allergic rhinitis due to pollen: Secondary | ICD-10-CM | POA: Diagnosis not present

## 2023-11-09 DIAGNOSIS — J3089 Other allergic rhinitis: Secondary | ICD-10-CM | POA: Diagnosis not present

## 2023-11-09 DIAGNOSIS — J3081 Allergic rhinitis due to animal (cat) (dog) hair and dander: Secondary | ICD-10-CM | POA: Diagnosis not present

## 2023-11-16 DIAGNOSIS — J3089 Other allergic rhinitis: Secondary | ICD-10-CM | POA: Diagnosis not present

## 2023-11-16 DIAGNOSIS — J301 Allergic rhinitis due to pollen: Secondary | ICD-10-CM | POA: Diagnosis not present

## 2023-11-16 DIAGNOSIS — J3081 Allergic rhinitis due to animal (cat) (dog) hair and dander: Secondary | ICD-10-CM | POA: Diagnosis not present

## 2023-11-23 DIAGNOSIS — J3081 Allergic rhinitis due to animal (cat) (dog) hair and dander: Secondary | ICD-10-CM | POA: Diagnosis not present

## 2023-11-23 DIAGNOSIS — J3089 Other allergic rhinitis: Secondary | ICD-10-CM | POA: Diagnosis not present

## 2023-11-23 DIAGNOSIS — J301 Allergic rhinitis due to pollen: Secondary | ICD-10-CM | POA: Diagnosis not present

## 2023-11-27 DIAGNOSIS — M1712 Unilateral primary osteoarthritis, left knee: Secondary | ICD-10-CM | POA: Diagnosis not present

## 2023-11-27 DIAGNOSIS — Z96651 Presence of right artificial knee joint: Secondary | ICD-10-CM | POA: Diagnosis not present

## 2023-12-06 ENCOUNTER — Encounter: Payer: Self-pay | Admitting: Dermatology

## 2023-12-06 ENCOUNTER — Ambulatory Visit: Admitting: Dermatology

## 2023-12-06 DIAGNOSIS — L811 Chloasma: Secondary | ICD-10-CM

## 2023-12-06 DIAGNOSIS — Z79899 Other long term (current) drug therapy: Secondary | ICD-10-CM

## 2023-12-06 DIAGNOSIS — L988 Other specified disorders of the skin and subcutaneous tissue: Secondary | ICD-10-CM

## 2023-12-06 DIAGNOSIS — Z7189 Other specified counseling: Secondary | ICD-10-CM

## 2023-12-06 MED ORDER — TRI-LUMA 0.01-4-0.05 % EX CREA: TOPICAL_CREAM | CUTANEOUS | 6 refills | Status: DC

## 2023-12-06 NOTE — Patient Instructions (Signed)

## 2023-12-06 NOTE — Progress Notes (Signed)
 Follow-Up Visit   Subjective  Amy Herman is a 78 y.o. female who presents for the following: 4 weeks f/u on Botox on her face, patient report much improvement noticed from Botox 10/31/2023  The patient has spots, moles and lesions to be evaluated, some may be new or changing and the patient may have concern these could be cancer.  The following portions of the chart were reviewed this encounter and updated as appropriate: medications, allergies, medical history  Review of Systems:  No other skin or systemic complaints except as noted in HPI or Assessment and Plan.  Objective  Well appearing patient in no apparent distress; mood and affect are within normal limits.  A focused examination was performed of the following areas: face Relevant exam findings are noted in the Assessment and Plan.          Assessment & Plan   FACIAL ELASTOSIS Excellent results with Frown Complex Botox last visit. Complete smoothing of Frown Complex Pt pleased with results. Recommend daily broad spectrum sunscreen SPF 30+ to sun-exposed areas, reapply every 2 hours as needed. Call for new or changing lesions.  Staying in the shade or wearing long sleeves, sun glasses (UVA+UVB protection) and wide brim hats (4-inch brim around the entire circumference of the hat) are also recommended for sun protection.    No additional treatment needed today  Photos today.   MELASMA Exam: reticulated hyperpigmented patches at face.  Upper lip and few other areas of face. Chronic and persistent condition with duration or expected duration over one year. Condition is symptomatic/ bothersome to patient. Not currently at goal.  Melasma is a chronic; persistent condition of hyperpigmented patches generally on the face, worse in summer due to higher UV exposure.    Heredity; thyroid  disease; sun exposure; pregnancy; birth control pills; epilepsy medication and darker skin may predispose to Melasma.   Recommendations  include: - Sun avoidance and daily broad spectrum (UVA/UVB) tinted mineral sunscreen SPF 30+, with Zinc or Titanium Dioxide. - Rx topical bleaching creams (i.e. hydroquinone) is a common treatment but should not be used long term.  Hydroquinones may be mixed with retinoids; vitamin C; steroids; Kojic Acid. - Alastin A-luminate, retinoids, vitamin C, topical tranexamic acid , glycolic acid and kojic acid can be used for brightening while on break from hydroquinone - Rx Azelaic Acid is also a treatment option that is safe for pregnancy (Category B). - OTC Heliocare can be helpful in control and prevention. - Oral Rx with Tranexamic Acid  250 mg - 650 mg po daily can be used for moderate to severe cases especially during summer (contraindications include pregnancy; lactation; hx of PE; hx of DVT; clotting disorder; heart disease; anticoagulant use and upcoming long trips)   - Chemical peels (would need multiple for best result).  - Lasers and  Microdermabrasion may also be helpful adjunct treatments.  Treatment Plan: Start Tri-luma cream apply to dark areas on face at bedtime. Only use specifically on dark areas and use daily, sparing amount until improved, then discontinue. Use only for 3 mos, then give 3 mos break, then may re-start if dark areas persist.  If insurance does not cover Tri-luma we may consider skin medicinals bleaching cream or finacea gel     Return in about 4 months (around 04/06/2024) for Botox .  IClara Crisp, CMA, am acting as scribe for Celine Collard, MD .   Documentation: I have reviewed the above documentation for accuracy and completeness, and I agree with the  above.  Celine Collard, MD

## 2023-12-07 DIAGNOSIS — J3089 Other allergic rhinitis: Secondary | ICD-10-CM | POA: Diagnosis not present

## 2023-12-07 DIAGNOSIS — J3081 Allergic rhinitis due to animal (cat) (dog) hair and dander: Secondary | ICD-10-CM | POA: Diagnosis not present

## 2023-12-07 DIAGNOSIS — J301 Allergic rhinitis due to pollen: Secondary | ICD-10-CM | POA: Diagnosis not present

## 2023-12-10 ENCOUNTER — Telehealth: Payer: Self-pay

## 2023-12-10 NOTE — Telephone Encounter (Signed)
 Patient came into the office to let you know TrimLuma was going to cost about $200 to fill. Patient states you discussed alternatives, bleaching cream from [employer] [facility] or Finacea Gel. Please advise what you would like sent in?

## 2023-12-11 MED ORDER — AZELAIC ACID 15 % EX GEL: 1.0000 | Freq: Two times a day (BID) | CUTANEOUS | 2 refills | Status: AC

## 2023-12-11 NOTE — Telephone Encounter (Signed)
Patient advised and RX sent in. aw 

## 2023-12-12 ENCOUNTER — Other Ambulatory Visit: Payer: Self-pay | Admitting: Family Medicine

## 2023-12-12 DIAGNOSIS — Z1231 Encounter for screening mammogram for malignant neoplasm of breast: Secondary | ICD-10-CM

## 2023-12-21 DIAGNOSIS — J3089 Other allergic rhinitis: Secondary | ICD-10-CM | POA: Diagnosis not present

## 2023-12-21 DIAGNOSIS — J3081 Allergic rhinitis due to animal (cat) (dog) hair and dander: Secondary | ICD-10-CM | POA: Diagnosis not present

## 2023-12-21 DIAGNOSIS — J301 Allergic rhinitis due to pollen: Secondary | ICD-10-CM | POA: Diagnosis not present

## 2023-12-26 DIAGNOSIS — M1712 Unilateral primary osteoarthritis, left knee: Secondary | ICD-10-CM | POA: Diagnosis not present

## 2023-12-27 DIAGNOSIS — J3089 Other allergic rhinitis: Secondary | ICD-10-CM | POA: Diagnosis not present

## 2023-12-28 DIAGNOSIS — J301 Allergic rhinitis due to pollen: Secondary | ICD-10-CM | POA: Diagnosis not present

## 2023-12-28 DIAGNOSIS — J3081 Allergic rhinitis due to animal (cat) (dog) hair and dander: Secondary | ICD-10-CM | POA: Diagnosis not present

## 2023-12-28 DIAGNOSIS — J3089 Other allergic rhinitis: Secondary | ICD-10-CM | POA: Diagnosis not present

## 2023-12-30 NOTE — Discharge Instructions (Signed)
 Instructions after Total Knee Replacement   Amy Herman P. Angie Fava., M.D.    Dept. of Orthopaedics & Sports Medicine [facility] [address] [place], [place]  [address]  Phone: [phone]   Fax: [phone]       [email or web address]       DIET: Drink plenty of non-alcoholic fluids. Resume your normal diet. Include foods high in fiber.  ACTIVITY:  You may use crutches or a walker with weight-bearing as tolerated, unless instructed otherwise. You may be weaned off of the walker or crutches by your Physical Therapist.  Do NOT place pillows under the knee. Anything placed under the knee could limit your ability to straighten the knee.   Use the Bone Foam 3 times a day for 30 minutes each session to help straighten the knee. Continue doing gentle exercises. Exercising will reduce the pain and swelling, increase motion, and prevent muscle weakness.   Please continue to use the TED compression stockings for 6 weeks. You may remove the stockings at night, but should reapply them in the morning. Do not drive or operate any equipment until instructed.  WOUND CARE:  The initial dressing (Aquacel) can remain in place for 7 days (see separate instructions). Continue to use the PolarCare or ice packs periodically to reduce pain and swelling. You may bathe or shower after the staples are removed at the first office visit following surgery.  MEDICATIONS: You may resume your regular medications. Please take the pain medication as prescribed on the medication. Do not take pain medication on an empty stomach. Unless instructed otherwise, you should take an enteric-coated aspirin 81 mg. TWICE a day. (This along with elevation will help reduce the possibility of blood clots/phlebitis in your operated leg.) Use a stool softener (such as Senokot-S or Colace) daily and a laxative (such as Miralax or Dulcolax) as needed to prevent constipation.  Do not drive or drink alcoholic beverages when  taking pain medications.  CALL THE OFFICE FOR: Temperature above 101 degrees Excessive bleeding or drainage on the dressing. Excessive swelling, coldness, or paleness of the toes. Persistent nausea and vomiting.  FOLLOW-UP:  You should have an appointment to return to the office in 10-14 days after surgery. Arrangements have been made for continuation of Physical Therapy (either home therapy or outpatient therapy).     [facility] Department Directory         [email or web address]       [email or web address]          Cardiology  Appointments: [place] - [phone]  Endocrinology  Appointments: [place] [phone] [place] - [phone]  Gastroenterology  Appointments: [employer] [phone] [place] - [phone]        General Surgery   Appointments: [facility]  Internal Medicine/Family Medicine  Appointments: [facility] [place] - [phone] [place] - [phone]  Metabolic and Weigh Loss Surgery  Appointments: [facility]        Neurology  Appointments: [place] [phone] [place] - [phone]  Neurosurgery  Appointments: [place]  Obstetrics & Gynecology  Appointments: [place] [phone] [place] - [phone]        Pediatrics  Appointments: [place] [phone] [place] - [phone]  Physiatry  Appointments: [place] [phone]  Physical Therapy  Appointments: [place] - [phone]        Podiatry  Appointments: [place] [phone] [place] - [phone]  Pulmonology  Appointments: [facility]  Rheumatology  Appointments: [place] [phone]  [place] Location: [facility]  [address] [place], [place]  [address]  [place] Location: [facility]. [address] [place], [place]  [address]  [place]  Location: [facility] [address] [place], [place]  [address]

## 2023-12-31 NOTE — H&P (Signed)
 ORTHOPAEDIC HISTORY & PHYSICAL Drake Fonda [place], [place] - 12/26/2023 11:30 AM EDT Formatting of this note is different from the original. NAME: Amy Herman H&P Date: 12/26/2023 Procedure Date: 01/09/2024  Chief Complaint: left knee pain  HPI Amy Herman is a 78 y.o. female who has severe Left knee pain. Patient reports a longstanding history of left knee pain that has progressively gotten worse over the last year or so. Patient states that she is very active at baseline, and it seems to interact or bother her when she is working out at [employer] and during her everyday activities. She states that most of the pain localizes along the lateral aspect of her knee. She does report intermittent swelling and feelings of instability with the knee giving way on her often. She denies any locking or maltracking symptoms. She states that the pain is made worse again anytime she is doing increased activity or prolonged weightbearing which has been very hard for the patient given her activity level. It does affect her ability to walk long distances and perform her ADLs that she would like. She has failed conservative treatment including Tylenol , exercise and activity modification. She does not currently utilize any ambulatory aids. She has requested operative intervention for relief of her DJD symptoms. Patient denies any previous cardiac or pulmonary history. No history of diabetes. No previous DVTs or clots. Denies any previous surgeries on this left knee. Of note she does have a history of a right total knee arthroplasty that was performed by Dr. Mardee in June 2023.  Social Hx: Patient states that she lives at home alone, but will be staying with her daughter postoperatively who will help look after her. She denies any alcohol, illicit drug use or nicotine  use. No previous smoking history.  Medications & Allergies Allergies: Allergies Allergen Reactions Timolol Shortness Of Breath Mild Brimonidine  Swelling Iritis Atorvastatin  Muscle Pain Cefuroxime Axetil Rash and Unknown Bumps on tounge Clarithromycin Rash and Unknown Ezetimibe  Diarrhea Stomach cramping Levofloxacin Rash Losartan  Cough Rosuvastatin  Muscle Pain  Home Medicines: Current Outpatient Medications on File Prior to Visit Medication Sig Dispense Refill acetaminophen  (TYLENOL ) 500 MG tablet Take 500 mg by mouth every 6 (six) hours as needed for Pain albuterol  90 mcg/actuation inhaler Inhale 1 inhalation into the lungs every 4 (four) hours as needed amoxicillin  (AMOXIL ) 500 MG capsule TAKE 4 CAPSULES BY MOUTH ONE HOUR PRIOR TO DENTAL TREATMENT aspirin 81 MG EC tablet Take 81 mg by mouth once daily calcium  citrate-vitamin D3 (CITRACAL+D) 315-200 mg-unit tablet Take 1 tablet by mouth once daily cetirizine (ZYRTEC) 10 MG tablet Take 10 mg by mouth once daily cholecalciferol  (VITAMIN D3) 2,000 unit tablet Take 2,000 Units by mouth once daily EPINEPHrine  (EPIPEN ) 0.3 mg/0.3 mL pen injector Inject 0.3 mg into the muscle once as needed 1 fluticasone  propionate (FLONASE ) 50 mcg/actuation nasal spray 2 sprays by Each Nare route Two (2) times a day. garlic 1,000 mg Cap Take 1 tablet by mouth once daily LUMIGAN 0.01 % ophthalmic solution Place 1 drop into the right eye nightly 0 montelukast  (SINGULAIR ) 10 mg tablet Take 10 mg by mouth at bedtime omega 3-dha-epa-fish oil 1,000 mg (120 mg-180 mg) Cap Take 1 capsule by mouth once daily omeprazole (PRILOSEC) 20 MG DR capsule Take 20 mg by mouth once daily potassium chloride  (KLOR-CON ) 10 mEq ER tablet Take 10 mEq by mouth once daily promethazine-dextromethorphan (PROMETHAZINE-DM) 6.25-15 mg/5 mL syrup Take 1.25 mLs by mouth every 4 (four) hours as needed triamterene -hydrochlorothiazide  (MAXZIDE -25) 37.5-25 mg tablet  TAKE 1 TABLET BY MOUTH DAILY  No current facility-administered medications on file prior to visit.  Medical / Surgical History  Past Medical History: Diagnosis  Date Arthritis Essential hypertension, benign 03/24/2008 Qualifier: Diagnosis of By: Randeen MD, Laine Caldron Last Assessment & Plan: Formatting of this note might be different from the original. bp in fair control at this time BP Readings from Last 1 Encounters: 02/19/17 116/68 No changes needed Disc lifstyle change with low sodium diet and exercise Will change losartan  to telmistartan (she is intol of losartan ) Due to recall valsartan  Watch bp Wa Estrogen deficiency 02/16/2016 GERD (gastroesophageal reflux disease) Glaucoma Hyperlipidemia Hypertension Hypokalemia 01/31/2011 Last Assessment & Plan: On supplementation Lab today Lumbar disc disease 08/15/2011 Last Assessment & Plan: Updated per pt This may add to some leg pain Under care of Dr Gaspar and Gillie Osteoarthritis Osteopenia 01/31/2011 Last Assessment & Plan: Overdue for dexa No fractures On ca and D Great exercise Personal history of colonic polyps 03/24/2008 Overview: Qualifier: Diagnosis of By: Randeen MD, Laine Caldron Last Assessment & Plan: Per pt due for 8 year colonosc in dec 2013 (she called) No bowel changes Also has fam hx Post-menopausal 01/31/2011 Thyroid  disease Varicose veins of bilateral lower extremities with other complications 07/23/2012   Past Surgical History: Procedure Laterality Date Right total knee arthroplasty using computer-assisted navigation 12/12/2021 Dr Mardee Bilateral blepharoplasties 02/16/2022 Dr, Greig Gay ENDOSCOPIC CARPAL TUNNEL RELEASE FOOT SURGERY HYSTERECTOMY KNEE ARTHROSCOPY Right Dr Gerome, [facility] KNEE SURGERY TUBAL LIGATION   Physical Exam  Ht: Wt:59.4 kg (131 lb) BMI: Body mass index is 24.75 kg/m.  General/Constitutional: No apparent distress: well-nourished and well developed. Eyes: Pupils equal, round with synchronous movement. Lymphatic: No palpable adenopathy. Respiratory: Patient has good chest rise and fall with inspiration and expiration. All lung fields are clear to  auscultation bilaterally. There is no Rales, rhonchi or wheezes appreciated. Cardiovascular: Upon auscultation there is a regular rate and rhythm without any murmurs, rubs, gallops or heaves appreciated. There does not appear to be any swelling down the lower extremities. Posterior tibial pulses appreciated bilaterally, 2+. Integumentary: No impressive skin lesions present, except as noted in detailed exam. Neuro/Psych: Normal mood and affect, oriented to person, place and time. Musculoskeletal: see exam below  Left knee exam Upon inspection of the patient's left knee there does not appear to be any skin changes, open abrasions, swelling or redness. There is a valgus alignment. Upon palpation, the patient reports having pain along the lateral aspect of their knee. Patient has full extension actively with ROM, and able to flex back to 115 degrees with mild pain. Varus and valgus stress testing shows some pseudolaxity to varus stressing. The patella tracks well within the femoral groove from flexion into extension with noticeable crepitus. Anterior and posterior drawer testing negative. Patient is neurovascularly intact down their lower extremity to all dermatomes. Posterior tibial pulses appreciated 2+.  Imaging left Knee Imaging: None ordered today. Previous images from 11/27/2023 were reviewed. There appears to be severe osteoarthritic changes appreciated across the patient's bilateral joint space, with the the most apparent showing on the lateral cartilage space. Complete bone-on-bone articulation appreciated. Underlying subchondral sclerosis is noted. Osteophyte formation is present. Overall alignment is relative valgus. No fractures, lytic lesions or gross fomites [place] on films  Assesment and Plan Knee DJD  I have recommended that Amy Herman undergo left total knee replacement. Consents has been signed. The risks, benefits, prognosis and alternatives including but not limited to DVT, PE,  infection, neurovascular injury,  failure of the procedure and death were explained to the patient and she is willing to proceed with surgery as described to her by myself. Plan will be for post operative admission of at least 1 midnight for pain control and PT. She will be managed with DVT prophylaxis, antibiotics preoperatively for 24 hours and aggressive in patient rehab.  Pre, intra and post op interventions were discussed. Patient has good understanding  Medication Reconciliation was performed. Discussed cessation of vitamins and supplements.  A total of 45 minutes was spent reviewing patient's charts, medical reconciliation, discussing/educating the patient about surgical interventions, and answering any questions provided by the patient.  JOSHUA DALLAS KOYANAGI, PA [facility] 12/26/2023  Electronically signed by KOYANAGI Fonda DALLAS, PA at 12/26/2023 1:39 PM EDT

## 2024-01-02 ENCOUNTER — Ambulatory Visit
Admission: RE | Admit: 2024-01-02 | Discharge: 2024-01-02 | Disposition: A | Discharge status: Home / Self Care | Source: Ambulatory Visit | Attending: Family Medicine | Admitting: Family Medicine

## 2024-01-02 ENCOUNTER — Other Ambulatory Visit: Payer: Self-pay

## 2024-01-02 ENCOUNTER — Inpatient Hospital Stay
Admission: RE | Admit: 2024-01-02 | Discharge: 2024-01-02 | Disposition: A | Discharge status: Home / Self Care | Source: Ambulatory Visit | Attending: Orthopedic Surgery | Admitting: Orthopedic Surgery

## 2024-01-02 DIAGNOSIS — M1712 Unilateral primary osteoarthritis, left knee: Secondary | ICD-10-CM

## 2024-01-02 DIAGNOSIS — K219 Gastro-esophageal reflux disease without esophagitis: Secondary | ICD-10-CM | POA: Insufficient documentation

## 2024-01-02 DIAGNOSIS — Z1231 Encounter for screening mammogram for malignant neoplasm of breast: Secondary | ICD-10-CM | POA: Insufficient documentation

## 2024-01-02 DIAGNOSIS — Z22322 Carrier or suspected carrier of Methicillin resistant Staphylococcus aureus: Secondary | ICD-10-CM

## 2024-01-02 DIAGNOSIS — E278 Other specified disorders of adrenal gland: Secondary | ICD-10-CM

## 2024-01-02 DIAGNOSIS — Z01812 Encounter for preprocedural laboratory examination: Secondary | ICD-10-CM | POA: Diagnosis not present

## 2024-01-02 DIAGNOSIS — Z01810 Encounter for preprocedural cardiovascular examination: Secondary | ICD-10-CM | POA: Diagnosis not present

## 2024-01-02 HISTORY — DX: Other specified disorders of adrenal gland: E27.8

## 2024-01-02 HISTORY — DX: Unspecified thoracic, thoracolumbar and lumbosacral intervertebral disc disorder: M51.9

## 2024-01-02 HISTORY — DX: Bilateral primary osteoarthritis of knee: M17.0

## 2024-01-02 HISTORY — DX: Carrier or suspected carrier of methicillin resistant Staphylococcus aureus: Z22.322

## 2024-01-02 HISTORY — DX: Hypokalemia: E87.6

## 2024-01-02 HISTORY — DX: Essential (primary) hypertension: I10

## 2024-01-02 HISTORY — DX: Age-related osteoporosis without current pathological fracture: M81.0

## 2024-01-02 LAB — URINALYSIS, ROUTINE W REFLEX MICROSCOPIC
Bacteria, UA: NONE SEEN
Bilirubin Urine: NEGATIVE
Glucose, UA: NEGATIVE mg/dL
Hgb urine dipstick: NEGATIVE
Ketones, ur: NEGATIVE mg/dL
Nitrite: NEGATIVE
Protein, ur: NEGATIVE mg/dL
Specific Gravity, Urine: 1.018 (ref 1.005–1.030)
pH: 7 (ref 5.0–8.0)

## 2024-01-02 LAB — CBC
HCT: 39.5 % (ref 36.0–46.0)
Hemoglobin: 13.1 g/dL (ref 12.0–15.0)
MCH: 30.3 pg (ref 26.0–34.0)
MCHC: 33.2 g/dL (ref 30.0–36.0)
MCV: 91.4 fL (ref 80.0–100.0)
Platelets: 307 10*3/uL (ref 150–400)
RBC: 4.32 MIL/uL (ref 3.87–5.11)
RDW: 12.1 % (ref 11.5–15.5)
WBC: 5.5 10*3/uL (ref 4.0–10.5)
nRBC: 0 % (ref 0.0–0.2)

## 2024-01-02 LAB — COMPREHENSIVE METABOLIC PANEL WITH GFR
ALT: 21 U/L (ref 0–44)
AST: 24 U/L (ref 15–41)
Albumin: 3.4 g/dL — ABNORMAL LOW (ref 3.5–5.0)
Alkaline Phosphatase: 91 U/L (ref 38–126)
Anion gap: 9 (ref 5–15)
BUN: 21 mg/dL (ref 8–23)
CO2: 27 mmol/L (ref 22–32)
Calcium: 9.5 mg/dL (ref 8.9–10.3)
Chloride: 104 mmol/L (ref 98–111)
Creatinine, Ser: 0.8 mg/dL (ref 0.44–1.00)
GFR, Estimated: >60 mL/min (ref >60)
Glucose, Bld: 101 mg/dL — ABNORMAL HIGH (ref 70–99)
Potassium: 3.8 mmol/L (ref 3.5–5.1)
Sodium: 140 mmol/L (ref 135–145)
Total Bilirubin: 0.5 mg/dL (ref 0.0–1.2)
Total Protein: 7.1 g/dL (ref 6.5–8.1)

## 2024-01-02 LAB — SURGICAL PCR SCREEN
MRSA, PCR: POSITIVE — AB
Staphylococcus aureus: POSITIVE — AB

## 2024-01-02 LAB — C-REACTIVE PROTEIN: CRP: 0.6 mg/dL (ref <1.0)

## 2024-01-02 LAB — SEDIMENTATION RATE: Sed Rate: 28 mm/h (ref 0–30)

## 2024-01-02 NOTE — Patient Instructions (Addendum)
 Your procedure is scheduled on: Wednesday, July 9 Report to the Registration Desk on the 1st floor of the [facility] To find out your arrival time, please call [phone] between 1PM - 3PM on: Tuesday, July 8 If your arrival time is 6:00 am, do not arrive before that time as the [facility] entrance doors do not open until 6:00 am.  REMEMBER: Instructions that are not followed completely may result in serious medical risk, up to and including death; or upon the discretion of your surgeon and anesthesiologist your surgery may need to be rescheduled.  Do not eat food after midnight the night before surgery.  No gum chewing or hard candies.  You may however, drink CLEAR liquids up to 2 hours before you are scheduled to arrive for your surgery. Do not drink anything within 2 hours of your scheduled arrival time.  Clear liquids include: - water  - apple juice without pulp - gatorade (not RED colors) - black coffee or tea (Do NOT add milk or creamers to the coffee or tea) Do NOT drink anything that is not on this list.  In addition, your doctor has ordered for you to drink the provided:  Ensure Pre-Surgery Clear Carbohydrate Drink  Drinking this carbohydrate drink up to two hours before surgery helps to reduce insulin resistance and improve patient outcomes. Please complete drinking 2 hours before scheduled arrival time.  One week prior to surgery: starting July 2 Stop aspirin and Anti-inflammatories (NSAIDS) such as Advil, Aleve, Ibuprofen, Motrin, Naproxen, Naprosyn and Aspirin based products such as Excedrin, Goody's Powder, BC Powder. Stop ANY OVER THE COUNTER supplements until after surgery. Stop vitamin C, calcium , vitamin D , garlic, omega 3.  You may however, continue to take Tylenol  if needed for pain up until the day of surgery.  Continue taking all of your other prescription medications up until the day of surgery.  ON THE DAY OF SURGERY ONLY TAKE THESE MEDICATIONS WITH  SIPS OF WATER:  omeprazole (PRILOSEC) potassium chloride    Bring your albuterol  inhaler to the hospital and use before surgery.  No Alcohol for 24 hours before or after surgery.  No Smoking including e-cigarettes for 24 hours before surgery.  No chewable tobacco products for at least 6 hours before surgery.  No nicotine  patches on the day of surgery.  Do not use any recreational drugs for at least a week (preferably 2 weeks) before your surgery.  Please be advised that the combination of cocaine and anesthesia may have negative outcomes, up to and including death. If you test positive for cocaine, your surgery will be cancelled.  On the morning of surgery brush your teeth with toothpaste and water, you may rinse your mouth with mouthwash if you wish. Do not swallow any toothpaste or mouthwash.  Use CHG Soap as directed on instruction sheet.  Do not wear jewelry, make-up, hairpins, clips or nail polish.  For welded (permanent) jewelry: bracelets, anklets, waist bands, etc.  Please have this removed prior to surgery.  If it is not removed, there is a chance that hospital personnel will need to cut it off on the day of surgery.  Do not wear lotions, powders, or perfumes.   Do not shave body hair from the neck down 48 hours before surgery.  Contact lenses, hearing aids and dentures may not be worn into surgery.  Do not bring valuables to the hospital. [facility] is not responsible for any missing/lost belongings or valuables.   Notify your doctor if  there is any change in your medical condition (cold, fever, infection).  Wear comfortable clothing (specific to your surgery type) to the hospital.  After surgery, you can help prevent lung complications by doing breathing exercises.  Take deep breaths and cough every 1-2 hours. Your doctor may order a device called an Incentive Spirometer to help you take deep breaths.  If you are being admitted to the hospital overnight, leave  your suitcase in the car. After surgery it may be brought to your room.  In case of increased patient census, it may be necessary for you, the patient, to continue your postoperative care in the Same Day Surgery department.  If you are being discharged the day of surgery, you will not be allowed to drive home. You will need a responsible individual to drive you home and stay with you for 24 hours after surgery.   If you are taking public transportation, you will need to have a responsible individual with you.  Please call the Pre-admissions Testing Dept. at [phone] if you have any questions about these instructions.  Surgery Visitation Policy:  Patients having surgery or a procedure may have two visitors.  Children under the age of 12 must have an adult with them who is not the patient.  Inpatient Visitation:    Visiting hours are 7 a.m. to 8 p.m. Up to four visitors are allowed at one time in a patient room. The visitors may rotate out with other people during the day.  One visitor age 58 or older may stay with the patient overnight and must be in the room by 8 p.m.   Merchandiser, retail to address health-related social needs:  [email or web address]    Pre-operative 5 CHG Bath Instructions   You can play a key role in reducing the risk of infection after surgery. Your skin needs to be as free of germs as possible. You can reduce the number of germs on your skin by washing with CHG (chlorhexidine  gluconate) soap before surgery. CHG is an antiseptic soap that kills germs and continues to kill germs even after washing.   DO NOT use if you have an allergy to chlorhexidine /CHG or antibacterial soaps. If your skin becomes reddened or irritated, stop using the CHG and notify one of our RNs at [phone].   Please shower with the CHG soap starting 4 days before surgery using the following schedule:     Please keep in mind the following:  DO NOT shave,  including legs and underarms, starting the day of your first shower.   You may shave your face at any point before/day of surgery.  Place clean sheets on your bed the day you start using CHG soap. Use a clean washcloth (not used since being washed) for each shower. DO NOT sleep with pets once you start using the CHG.   CHG Shower Instructions:  If you choose to wash your hair and private area, wash first with your normal shampoo/soap.  After you use shampoo/soap, rinse your hair and body thoroughly to remove shampoo/soap residue.  Turn the water OFF and apply about 3 tablespoons (45 ml) of CHG soap to a CLEAN washcloth.  Apply CHG soap ONLY FROM YOUR NECK DOWN TO YOUR TOES (washing for 3-5 minutes)  DO NOT use CHG soap on face, private areas, open wounds, or sores.  Pay special attention to the area where your surgery is being performed.  If you are having back surgery, having someone wash your back for  you may be helpful. Wait 2 minutes after CHG soap is applied, then you may rinse off the CHG soap.  Pat dry with a clean towel  Put on clean clothes/pajamas   If you choose to wear lotion, please use ONLY the CHG-compatible lotions on the back of this paper.     Additional instructions for the day of surgery: DO NOT APPLY any lotions, deodorants, cologne, or perfumes.   Put on clean/comfortable clothes.  Brush your teeth.  Ask your nurse before applying any prescription medications to the skin.      CHG Compatible Lotions   Aveeno Moisturizing lotion  Cetaphil Moisturizing Cream  Cetaphil Moisturizing Lotion  Clairol Herbal Essence Moisturizing Lotion, Dry Skin  Clairol Herbal Essence Moisturizing Lotion, Extra Dry Skin  Clairol Herbal Essence Moisturizing Lotion, Normal Skin  Curel Age Defying Therapeutic Moisturizing Lotion with Alpha Hydroxy  Curel Extreme Care Body Lotion  Curel Soothing Hands Moisturizing Hand Lotion  Curel Therapeutic Moisturizing Cream, Fragrance-Free   Curel Therapeutic Moisturizing Lotion, Fragrance-Free  Curel Therapeutic Moisturizing Lotion, Original Formula  Eucerin Daily Replenishing Lotion  Eucerin Dry Skin Therapy Plus Alpha Hydroxy Crme  Eucerin Dry Skin Therapy Plus Alpha Hydroxy Lotion  Eucerin Original Crme  Eucerin Original Lotion  Eucerin Plus Crme Eucerin Plus Lotion  Eucerin TriLipid Replenishing Lotion  Keri Anti-Bacterial Hand Lotion  Keri Deep Conditioning Original Lotion Dry Skin Formula Softly Scented  Keri Deep Conditioning Original Lotion, Fragrance Free Sensitive Skin Formula  Keri Lotion Fast Absorbing Fragrance Free Sensitive Skin Formula  Keri Lotion Fast Absorbing Softly Scented Dry Skin Formula  Keri Original Lotion  Keri Skin Renewal Lotion Keri Silky Smooth Lotion  Keri Silky Smooth Sensitive Skin Lotion  Nivea Body Creamy Conditioning Oil  Nivea Body Extra Enriched Lotion  Nivea Body Original Lotion  Nivea Body Sheer Moisturizing Lotion Nivea Crme  Nivea Skin Firming Lotion  NutraDerm 30 Skin Lotion  NutraDerm Skin Lotion  NutraDerm Therapeutic Skin Cream  NutraDerm Therapeutic Skin Lotion  ProShield Protective Hand Cream  Provon moisturizing lotion   Preoperative Educational Videos for Total Hip, Knee and Shoulder Replacements  To better prepare for surgery, please view our videos that explain the physical activity and discharge planning required to have the best surgical recovery at [facility].  [email or web address]  Questions? Call [phone] or email [email or web address]

## 2024-01-02 NOTE — Progress Notes (Signed)
  Perioperative Services  Abnormal Lab Notification and Treatment Plan of Care  Date: 01/02/24  Name: Amy Herman DOB: 02/19/1946 MRN:   [id number]  Re: Abnormal labs noted during PAT appointment  Provider Notified: Mardee Lynwood SQUIBB, MD Notification mode: Routed and/or faxed via [facility]  Labs of concern: Lab Results  Component Value Date   STAPHAUREUS POSITIVE (A) 01/02/2024   MRSAPCR POSITIVE (A) 01/02/2024    Notes: Patient is scheduled for a ARTHROPLASTY, KNEE, TOTAL, USING IMAGELESS COMPUTER-ASSISTED NAVIGATION (Left: Knee) on 01/09/2024. She is scheduled to receive CEFAZOLIN  pre-operatively. Pre-surgical PCR (+) for MRSA; see above.  PLANS:  Amy Herman is scheduled for the above procedure with Dr. Lynwood Mardee, MD.  Preoperative testing revealed a (+) MRSA PCR screen. Patient will need additional preoperative antimicrobial coverage for this pathogen.  Sending result to primary attending surgeon for review.  Surgeon to place further orders as deemed appropriate for this case.  Patient's medical history updated in EMR to reflect today's (+) result. No further needs from the PAT department identified at this time.  Dorise Pereyra, MSN, APRN, FNP-C, CEN [facility]  Perioperative Services Nurse Practitioner Phone: [phone] 01/02/24 1:15 PM

## 2024-01-06 ENCOUNTER — Encounter: Payer: Self-pay | Admitting: Orthopedic Surgery

## 2024-01-06 MED ORDER — EPHEDRINE 5 MG/ML INJ
INTRAVENOUS | Status: AC
  Filled 2024-01-06: qty 5

## 2024-01-06 MED ORDER — FENTANYL CITRATE (PF) 100 MCG/2ML IJ SOLN
INTRAMUSCULAR | Status: AC
  Filled 2024-01-06: qty 2

## 2024-01-06 MED ORDER — PHENYLEPHRINE 80 MCG/ML (10ML) SYRINGE FOR IV PUSH (FOR BLOOD PRESSURE SUPPORT)
PREFILLED_SYRINGE | INTRAVENOUS | Status: AC
  Filled 2024-01-06: qty 10

## 2024-01-06 MED ORDER — DEXAMETHASONE SODIUM PHOSPHATE 10 MG/ML IJ SOLN
INTRAMUSCULAR | Status: AC
  Filled 2024-01-06: qty 1

## 2024-01-06 MED ORDER — ONDANSETRON HCL 4 MG/2ML IJ SOLN
INTRAMUSCULAR | Status: AC
  Filled 2024-01-06: qty 2

## 2024-01-06 MED ORDER — PROPOFOL 10 MG/ML IV BOLUS
INTRAVENOUS | Status: AC
  Filled 2024-01-06: qty 20

## 2024-01-06 MED ORDER — MIDAZOLAM HCL 2 MG/2ML IJ SOLN
INTRAMUSCULAR | Status: AC
  Filled 2024-01-06: qty 2

## 2024-01-06 MED ORDER — LIDOCAINE HCL (PF) 2 % IJ SOLN
INTRAMUSCULAR | Status: AC
  Filled 2024-01-06: qty 5

## 2024-01-09 ENCOUNTER — Encounter
Admission: RE | Disposition: A | Discharge status: Home / Self Care | Payer: Self-pay | Source: Ambulatory Visit | Attending: Orthopedic Surgery

## 2024-01-09 ENCOUNTER — Other Ambulatory Visit: Payer: Self-pay

## 2024-01-09 ENCOUNTER — Other Ambulatory Visit: Payer: Self-pay | Admitting: Orthopedic Surgery

## 2024-01-09 ENCOUNTER — Ambulatory Visit: Payer: Self-pay | Admitting: Urgent Care

## 2024-01-09 ENCOUNTER — Inpatient Hospital Stay
Admission: RE | Admit: 2024-01-09 | Discharge: 2024-01-09 | Disposition: A | Discharge status: Home / Self Care | Payer: Self-pay | Source: Ambulatory Visit | Attending: Orthopedic Surgery | Admitting: Orthopedic Surgery

## 2024-01-09 ENCOUNTER — Observation Stay
Admission: RE | Admit: 2024-01-09 | Discharge: 2024-01-10 | Disposition: A | Discharge status: Home / Self Care | Source: Ambulatory Visit | Attending: Orthopedic Surgery | Admitting: Orthopedic Surgery

## 2024-01-09 ENCOUNTER — Observation Stay

## 2024-01-09 ENCOUNTER — Ambulatory Visit: Payer: Self-pay | Admitting: Family Medicine

## 2024-01-09 ENCOUNTER — Encounter: Admitting: Certified Registered"

## 2024-01-09 ENCOUNTER — Encounter: Payer: Self-pay | Admitting: Orthopedic Surgery

## 2024-01-09 DIAGNOSIS — Z7982 Long term (current) use of aspirin: Secondary | ICD-10-CM | POA: Diagnosis not present

## 2024-01-09 DIAGNOSIS — M1712 Unilateral primary osteoarthritis, left knee: Principal | ICD-10-CM | POA: Insufficient documentation

## 2024-01-09 DIAGNOSIS — Z96652 Presence of left artificial knee joint: Secondary | ICD-10-CM

## 2024-01-09 DIAGNOSIS — I10 Essential (primary) hypertension: Secondary | ICD-10-CM | POA: Diagnosis not present

## 2024-01-09 DIAGNOSIS — Z96651 Presence of right artificial knee joint: Secondary | ICD-10-CM | POA: Insufficient documentation

## 2024-01-09 DIAGNOSIS — Z79899 Other long term (current) drug therapy: Secondary | ICD-10-CM | POA: Diagnosis not present

## 2024-01-09 DIAGNOSIS — Z471 Aftercare following joint replacement surgery: Secondary | ICD-10-CM | POA: Diagnosis not present

## 2024-01-09 DIAGNOSIS — K219 Gastro-esophageal reflux disease without esophagitis: Secondary | ICD-10-CM

## 2024-01-09 DIAGNOSIS — E278 Other specified disorders of adrenal gland: Secondary | ICD-10-CM

## 2024-01-09 DIAGNOSIS — Z01812 Encounter for preprocedural laboratory examination: Secondary | ICD-10-CM

## 2024-01-09 HISTORY — PX: KNEE ARTHROPLASTY: SHX992

## 2024-01-09 SURGERY — ARTHROPLASTY, KNEE, TOTAL, USING IMAGELESS COMPUTER-ASSISTED NAVIGATION
Anesthesia: Spinal | Site: Knee | Laterality: Left

## 2024-01-09 MED ORDER — FERROUS SULFATE 325 (65 FE) MG PO TABS
325.0000 mg | ORAL_TABLET | Freq: Two times a day (BID) | ORAL | Status: DC
  Administered 2024-01-09 – 2024-01-10 (×2): 325 mg via ORAL
  Filled 2024-01-09 (×2): qty 1

## 2024-01-09 MED ORDER — PHENYLEPHRINE HCL-NACL 20-0.9 MG/250ML-% IV SOLN
INTRAVENOUS | Status: AC
  Filled 2024-01-09: qty 250

## 2024-01-09 MED ORDER — MIDAZOLAM HCL 2 MG/2ML IJ SOLN
INTRAMUSCULAR | Status: AC
  Filled 2024-01-09: qty 2

## 2024-01-09 MED ORDER — CELECOXIB 200 MG PO CAPS
ORAL_CAPSULE | ORAL | Status: AC
  Filled 2024-01-09: qty 1

## 2024-01-09 MED ORDER — OXYCODONE HCL 5 MG PO TABS: 10.0000 mg | ORAL_TABLET | ORAL | Status: DC | PRN

## 2024-01-09 MED ORDER — PHENOL 1.4 % MT LIQD: 1.0000 | OROMUCOSAL | Status: DC | PRN

## 2024-01-09 MED ORDER — FENTANYL CITRATE (PF) 100 MCG/2ML IJ SOLN: 25.0000 ug | INTRAMUSCULAR | Status: DC | PRN

## 2024-01-09 MED ORDER — LACTATED RINGERS IV SOLN: INTRAVENOUS | Status: DC

## 2024-01-09 MED ORDER — ASPIRIN 81 MG PO CHEW
CHEWABLE_TABLET | ORAL | Status: AC
Start: 2024-01-09 — End: 2024-01-09
  Filled 2024-01-09: qty 1

## 2024-01-09 MED ORDER — TRIAMTERENE-HCTZ 37.5-25 MG PO TABS
1.0000 | ORAL_TABLET | Freq: Every day | ORAL | Status: DC
  Filled 2024-01-09 (×2): qty 1

## 2024-01-09 MED ORDER — FLUTICASONE PROPIONATE 50 MCG/ACT NA SUSP: 2.0000 | NASAL | Status: DC | PRN

## 2024-01-09 MED ORDER — ENSURE PRE-SURGERY PO LIQD
296.0000 mL | Freq: Once | ORAL | Status: DC
  Filled 2024-01-09: qty 296

## 2024-01-09 MED ORDER — PROPOFOL 1000 MG/100ML IV EMUL
INTRAVENOUS | Status: AC
  Filled 2024-01-09: qty 100

## 2024-01-09 MED ORDER — CHLORHEXIDINE GLUCONATE 4 % EX SOLN: 60.0000 mL | Freq: Once | CUTANEOUS | Status: DC

## 2024-01-09 MED ORDER — PANTOPRAZOLE SODIUM 40 MG PO TBEC
40.0000 mg | DELAYED_RELEASE_TABLET | Freq: Two times a day (BID) | ORAL | Status: DC
  Administered 2024-01-09 – 2024-01-10 (×3): 40 mg via ORAL
  Filled 2024-01-09: qty 1

## 2024-01-09 MED ORDER — ACETAMINOPHEN 10 MG/ML IV SOLN
INTRAVENOUS | Status: AC
  Filled 2024-01-09: qty 100

## 2024-01-09 MED ORDER — MUPIROCIN 2 % EX OINT: 1.0000 | TOPICAL_OINTMENT | Freq: Two times a day (BID) | CUTANEOUS | 0 refills | Status: AC

## 2024-01-09 MED ORDER — METOCLOPRAMIDE HCL 10 MG PO TABS
ORAL_TABLET | ORAL | Status: AC
  Filled 2024-01-09: qty 1

## 2024-01-09 MED ORDER — CEFAZOLIN SODIUM-DEXTROSE 2-4 GM/100ML-% IV SOLN
INTRAVENOUS | Status: AC
  Filled 2024-01-09: qty 100

## 2024-01-09 MED ORDER — ONDANSETRON HCL 4 MG/2ML IJ SOLN: 4.0000 mg | Freq: Four times a day (QID) | INTRAMUSCULAR | Status: DC | PRN

## 2024-01-09 MED ORDER — CELECOXIB 200 MG PO CAPS
ORAL_CAPSULE | ORAL | Status: AC
  Filled 2024-01-09: qty 2

## 2024-01-09 MED ORDER — TRANEXAMIC ACID-NACL 1000-0.7 MG/100ML-% IV SOLN
1000.0000 mg | Freq: Once | INTRAVENOUS | Status: AC
  Administered 2024-01-09: 1000 mg via INTRAVENOUS

## 2024-01-09 MED ORDER — SENNOSIDES-DOCUSATE SODIUM 8.6-50 MG PO TABS
1.0000 | ORAL_TABLET | Freq: Two times a day (BID) | ORAL | Status: DC
  Administered 2024-01-09 – 2024-01-10 (×2): 1 via ORAL
  Filled 2024-01-09 (×2): qty 1

## 2024-01-09 MED ORDER — ACETAMINOPHEN 10 MG/ML IV SOLN
INTRAVENOUS | Status: DC | PRN
  Administered 2024-01-09: 1000 mg via INTRAVENOUS

## 2024-01-09 MED ORDER — MIDAZOLAM HCL 5 MG/5ML IJ SOLN
INTRAMUSCULAR | Status: DC | PRN
  Administered 2024-01-09: 1 mg via INTRAVENOUS

## 2024-01-09 MED ORDER — CHLORHEXIDINE GLUCONATE 0.12 % MT SOLN
15.0000 mL | Freq: Once | OROMUCOSAL | Status: AC
Start: 2024-01-09 — End: 2024-01-09
  Administered 2024-01-09: 15 mL via OROMUCOSAL

## 2024-01-09 MED ORDER — PANTOPRAZOLE SODIUM 40 MG PO TBEC
DELAYED_RELEASE_TABLET | ORAL | Status: AC
  Filled 2024-01-09: qty 1

## 2024-01-09 MED ORDER — BUPIVACAINE LIPOSOME 1.3 % IJ SUSP
INTRAMUSCULAR | Status: AC
  Filled 2024-01-09: qty 20

## 2024-01-09 MED ORDER — ALUM & MAG HYDROXIDE-SIMETH 200-200-20 MG/5ML PO SUSP: 30.0000 mL | ORAL | Status: DC | PRN

## 2024-01-09 MED ORDER — METOCLOPRAMIDE HCL 10 MG PO TABS
10.0000 mg | ORAL_TABLET | Freq: Three times a day (TID) | ORAL | Status: DC
  Administered 2024-01-09 – 2024-01-10 (×3): 10 mg via ORAL
  Filled 2024-01-09 (×2): qty 1

## 2024-01-09 MED ORDER — GLYCOPYRROLATE 0.2 MG/ML IJ SOLN
INTRAMUSCULAR | Status: DC | PRN
Start: 2024-01-09 — End: 2024-01-09
  Administered 2024-01-09: .2 mg via INTRAVENOUS

## 2024-01-09 MED ORDER — SENNOSIDES-DOCUSATE SODIUM 8.6-50 MG PO TABS
ORAL_TABLET | ORAL | Status: AC
  Filled 2024-01-09: qty 1

## 2024-01-09 MED ORDER — SODIUM CHLORIDE 0.9 % IV SOLN: INTRAVENOUS | Status: DC

## 2024-01-09 MED ORDER — HYDROMORPHONE HCL 1 MG/ML IJ SOLN: 0.5000 mg | INTRAMUSCULAR | Status: DC | PRN

## 2024-01-09 MED ORDER — VANCOMYCIN HCL IN DEXTROSE 1-5 GM/200ML-% IV SOLN
INTRAVENOUS | Status: AC
  Filled 2024-01-09: qty 200

## 2024-01-09 MED ORDER — TRANEXAMIC ACID-NACL 1000-0.7 MG/100ML-% IV SOLN
INTRAVENOUS | Status: AC
  Filled 2024-01-09: qty 100

## 2024-01-09 MED ORDER — DIPHENHYDRAMINE HCL 12.5 MG/5ML PO ELIX: 12.5000 mg | ORAL_SOLUTION | ORAL | Status: DC | PRN

## 2024-01-09 MED ORDER — DEXAMETHASONE SODIUM PHOSPHATE 10 MG/ML IJ SOLN
INTRAMUSCULAR | Status: AC
  Filled 2024-01-09: qty 1

## 2024-01-09 MED ORDER — FLEET ENEMA RE ENEM: 1.0000 | ENEMA | Freq: Once | RECTAL | Status: DC | PRN

## 2024-01-09 MED ORDER — CELECOXIB 200 MG PO CAPS
200.0000 mg | ORAL_CAPSULE | Freq: Two times a day (BID) | ORAL | Status: DC
  Administered 2024-01-09 – 2024-01-10 (×2): 200 mg via ORAL
  Filled 2024-01-09: qty 1

## 2024-01-09 MED ORDER — TRANEXAMIC ACID-NACL 1000-0.7 MG/100ML-% IV SOLN
INTRAVENOUS | Status: AC
Start: 2024-01-09 — End: 2024-01-09
  Filled 2024-01-09: qty 100

## 2024-01-09 MED ORDER — CEFAZOLIN SODIUM-DEXTROSE 2-4 GM/100ML-% IV SOLN
2.0000 g | Freq: Four times a day (QID) | INTRAVENOUS | Status: AC
  Administered 2024-01-09 (×2): 2 g via INTRAVENOUS

## 2024-01-09 MED ORDER — OXYCODONE HCL 5 MG PO TABS: 5.0000 mg | ORAL_TABLET | ORAL | Status: DC | PRN

## 2024-01-09 MED ORDER — ACETAMINOPHEN 10 MG/ML IV SOLN
1000.0000 mg | Freq: Four times a day (QID) | INTRAVENOUS | Status: DC
  Administered 2024-01-09 – 2024-01-10 (×3): 1000 mg via INTRAVENOUS
  Filled 2024-01-09 (×2): qty 100

## 2024-01-09 MED ORDER — DIPHENHYDRAMINE HCL 50 MG/ML IJ SOLN
INTRAMUSCULAR | Status: AC
  Filled 2024-01-09: qty 1

## 2024-01-09 MED ORDER — PROPOFOL 500 MG/50ML IV EMUL
INTRAVENOUS | Status: DC | PRN
  Administered 2024-01-09: 100 ug/kg/min via INTRAVENOUS

## 2024-01-09 MED ORDER — ONDANSETRON HCL 4 MG PO TABS: 4.0000 mg | ORAL_TABLET | Freq: Four times a day (QID) | ORAL | Status: DC | PRN

## 2024-01-09 MED ORDER — TRANEXAMIC ACID-NACL 1000-0.7 MG/100ML-% IV SOLN
1000.0000 mg | INTRAVENOUS | Status: AC
  Administered 2024-01-09: 1000 mg via INTRAVENOUS

## 2024-01-09 MED ORDER — TRAMADOL HCL 50 MG PO TABS: 50.0000 mg | ORAL_TABLET | ORAL | Status: DC | PRN

## 2024-01-09 MED ORDER — SODIUM CHLORIDE (PF) 0.9 % IJ SOLN
INTRAMUSCULAR | Status: DC | PRN
  Administered 2024-01-09: 120 mL via INTRAMUSCULAR

## 2024-01-09 MED ORDER — CHLORHEXIDINE GLUCONATE 4 % EX SOLN: 1.0000 | CUTANEOUS | 1 refills | Status: AC

## 2024-01-09 MED ORDER — MENTHOL 3 MG MT LOZG
1.0000 | LOZENGE | OROMUCOSAL | Status: DC | PRN
Start: 2024-01-09 — End: 2024-01-10

## 2024-01-09 MED ORDER — MAGNESIUM HYDROXIDE 400 MG/5ML PO SUSP
30.0000 mL | Freq: Every day | ORAL | Status: DC
Start: 2024-01-09 — End: 2024-01-10

## 2024-01-09 MED ORDER — GABAPENTIN 300 MG PO CAPS
300.0000 mg | ORAL_CAPSULE | Freq: Once | ORAL | Status: AC
  Administered 2024-01-09: 300 mg via ORAL

## 2024-01-09 MED ORDER — SODIUM CHLORIDE 0.9 % IR SOLN
Status: DC | PRN
Start: 2024-01-09 — End: 2024-01-09
  Administered 2024-01-09: 3000 mL

## 2024-01-09 MED ORDER — DEXAMETHASONE SODIUM PHOSPHATE 10 MG/ML IJ SOLN
8.0000 mg | Freq: Once | INTRAMUSCULAR | Status: AC
  Administered 2024-01-09: 8 mg via INTRAVENOUS

## 2024-01-09 MED ORDER — BISACODYL 10 MG RE SUPP: 10.0000 mg | Freq: Every day | RECTAL | Status: DC | PRN

## 2024-01-09 MED ORDER — GABAPENTIN 300 MG PO CAPS
ORAL_CAPSULE | ORAL | Status: AC
  Filled 2024-01-09: qty 1

## 2024-01-09 MED ORDER — LATANOPROST 0.005 % OP SOLN
1.0000 [drp] | Freq: Every day | OPHTHALMIC | Status: DC
  Administered 2024-01-09: 1 [drp] via OPHTHALMIC
  Filled 2024-01-09: qty 2.5

## 2024-01-09 MED ORDER — CELECOXIB 200 MG PO CAPS
400.0000 mg | ORAL_CAPSULE | Freq: Once | ORAL | Status: AC
  Administered 2024-01-09: 400 mg via ORAL

## 2024-01-09 MED ORDER — ALBUTEROL SULFATE (2.5 MG/3ML) 0.083% IN NEBU: 3.0000 mL | INHALATION_SOLUTION | Freq: Four times a day (QID) | RESPIRATORY_TRACT | Status: DC | PRN

## 2024-01-09 MED ORDER — EPHEDRINE SULFATE-NACL 50-0.9 MG/10ML-% IV SOSY
PREFILLED_SYRINGE | INTRAVENOUS | Status: DC | PRN
  Administered 2024-01-09 (×2): 5 mg via INTRAVENOUS

## 2024-01-09 MED ORDER — MONTELUKAST SODIUM 10 MG PO TABS: 10.0000 mg | ORAL_TABLET | Freq: Every evening | ORAL | Status: DC | PRN

## 2024-01-09 MED ORDER — VANCOMYCIN HCL IN DEXTROSE 1-5 GM/200ML-% IV SOLN
1000.0000 mg | Freq: Once | INTRAVENOUS | Status: AC
  Administered 2024-01-09: 1000 mg via INTRAVENOUS

## 2024-01-09 MED ORDER — ORAL CARE MOUTH RINSE: 15.0000 mL | Freq: Once | OROMUCOSAL | Status: AC

## 2024-01-09 MED ORDER — BUPIVACAINE HCL (PF) 0.5 % IJ SOLN
INTRAMUSCULAR | Status: DC | PRN
  Administered 2024-01-09: 2.6 mL

## 2024-01-09 MED ORDER — BUPIVACAINE HCL (PF) 0.25 % IJ SOLN
INTRAMUSCULAR | Status: AC
  Filled 2024-01-09: qty 60

## 2024-01-09 MED ORDER — SURGIPHOR WOUND IRRIGATION SYSTEM - OPTIME: TOPICAL | Status: DC | PRN

## 2024-01-09 MED ORDER — SODIUM CHLORIDE (PF) 0.9 % IJ SOLN
INTRAMUSCULAR | Status: AC
  Filled 2024-01-09: qty 40

## 2024-01-09 MED ORDER — CHLORHEXIDINE GLUCONATE 0.12 % MT SOLN
OROMUCOSAL | Status: AC
  Filled 2024-01-09: qty 15

## 2024-01-09 MED ORDER — CEFAZOLIN SODIUM-DEXTROSE 2-4 GM/100ML-% IV SOLN
2.0000 g | INTRAVENOUS | Status: AC
  Administered 2024-01-09: 2 g via INTRAVENOUS

## 2024-01-09 MED ORDER — GLYCOPYRROLATE 0.2 MG/ML IJ SOLN
INTRAMUSCULAR | Status: AC
Start: 2024-01-09 — End: 2024-01-09
  Filled 2024-01-09: qty 1

## 2024-01-09 MED ORDER — PHENYLEPHRINE HCL-NACL 20-0.9 MG/250ML-% IV SOLN
INTRAVENOUS | Status: DC | PRN
Start: 2024-01-09 — End: 2024-01-09
  Administered 2024-01-09: 10 ug/min via INTRAVENOUS

## 2024-01-09 MED ORDER — ACETAMINOPHEN 325 MG PO TABS: 325.0000 mg | ORAL_TABLET | Freq: Four times a day (QID) | ORAL | Status: DC | PRN

## 2024-01-09 MED ORDER — ASPIRIN 81 MG PO CHEW
81.0000 mg | CHEWABLE_TABLET | Freq: Two times a day (BID) | ORAL | Status: DC
  Administered 2024-01-09 – 2024-01-10 (×2): 81 mg via ORAL
  Filled 2024-01-09: qty 1

## 2024-01-09 MED ORDER — DIPHENHYDRAMINE HCL 50 MG/ML IJ SOLN
INTRAMUSCULAR | Status: DC | PRN
  Administered 2024-01-09: 12.5 mg via INTRAVENOUS

## 2024-01-09 SURGICAL SUPPLY — 64 items
ATTUNE PSFEM LTSZ5 NARCEM KNEE (Femur) IMPLANT
ATTUNE PSRP INSR SZ 5 10M KNEE (Insert) IMPLANT
BASEPLATE TIBIAL ROTATING SZ 4 (Knees) IMPLANT
BATTERY INSTRU NAVIGATION (MISCELLANEOUS) ×8 IMPLANT
BIT DRILL QUICK REL 1/8 2PK SL (BIT) ×2 IMPLANT
BLADE CLIPPER SURG (BLADE) IMPLANT
BLADE SAW 70X12.5 (BLADE) ×2 IMPLANT
BLADE SAW 90X13X1.19 OSCILLAT (BLADE) ×2 IMPLANT
BLADE SAW 90X25X1.19 OSCILLAT (BLADE) ×2 IMPLANT
BRUSH SCRUB EZ PLAIN DRY (MISCELLANEOUS) ×2 IMPLANT
CEMENT BONE GENTAMICIN 40 (Cement) IMPLANT
COOLER ICEMAN CLASSIC (MISCELLANEOUS) ×2 IMPLANT
CUFF TRNQT CYL 24X4X16.5-23 (TOURNIQUET CUFF) IMPLANT
CUFF TRNQT CYL 30X4X21-28X (TOURNIQUET CUFF) IMPLANT
DRAPE SHEET LG 3/4 BI-LAMINATE (DRAPES) ×2 IMPLANT
DRSG AQUACEL AG ADV 3.5X14 (GAUZE/BANDAGES/DRESSINGS) ×2 IMPLANT
DRSG MEPILEX SACRM 8.7X9.8 (GAUZE/BANDAGES/DRESSINGS) ×2 IMPLANT
DRSG TEGADERM 4X4.75 (GAUZE/BANDAGES/DRESSINGS) ×2 IMPLANT
DRSG XEROFORM 1X8 (GAUZE/BANDAGES/DRESSINGS) IMPLANT
DURAPREP 26ML APPLICATOR (WOUND CARE) ×4 IMPLANT
ELECT CAUTERY BLADE 6.4 (BLADE) ×2 IMPLANT
ELECTRODE REM PT RTRN 9FT ADLT (ELECTROSURGICAL) ×2 IMPLANT
EVACUATOR 1/8 PVC DRAIN (DRAIN) ×2 IMPLANT
EX-PIN ORTHOLOCK NAV 4X150 (PIN) ×4 IMPLANT
GAUZE XEROFORM 1X8 LF (GAUZE/BANDAGES/DRESSINGS) ×2 IMPLANT
GLOVE BIOGEL M STRL SZ7.5 (GLOVE) ×12 IMPLANT
GLOVE BIOGEL PI IND STRL 8 (GLOVE) ×2 IMPLANT
GLOVE SRG 8 PF TXTR STRL LF DI (GLOVE) ×2 IMPLANT
GOWN STRL REUS W/ TWL LRG LVL3 (GOWN DISPOSABLE) ×2 IMPLANT
GOWN STRL REUS W/ TWL XL LVL3 (GOWN DISPOSABLE) ×2 IMPLANT
GOWN TOGA ZIPPER T7+ PEEL AWAY (MISCELLANEOUS) ×2 IMPLANT
HOLDER FOLEY CATH W/STRAP (MISCELLANEOUS) ×2 IMPLANT
HOOD PEEL AWAY T7 (MISCELLANEOUS) ×2 IMPLANT
KIT TURNOVER KIT A (KITS) ×2 IMPLANT
KNIFE SCULPS 14X20 (INSTRUMENTS) ×2 IMPLANT
MANIFOLD NEPTUNE II (INSTRUMENTS) ×4 IMPLANT
NDL SPNL 20GX3.5 QUINCKE YW (NEEDLE) ×4 IMPLANT
NEEDLE SPNL 20GX3.5 QUINCKE YW (NEEDLE) ×2 IMPLANT
PACK TOTAL KNEE (MISCELLANEOUS) ×2 IMPLANT
PAD ABD DERMACEA PRESS 5X9 (GAUZE/BANDAGES/DRESSINGS) ×4 IMPLANT
PAD ARMBOARD POSITIONER FOAM (MISCELLANEOUS) ×6 IMPLANT
PAD COLD UNI WRAP-ON (PAD) ×2 IMPLANT
PATELLA MEDIAL ATTUN 35MM KNEE (Knees) IMPLANT
PENCIL SMOKE EVACUATOR COATED (MISCELLANEOUS) ×2 IMPLANT
PIN DRILL FIX HALF THREAD (BIT) ×4 IMPLANT
PIN FIXATION 1/8DIA X 3INL (PIN) ×2 IMPLANT
SOL .9 NS 3000ML IRR UROMATIC (IV SOLUTION) ×2 IMPLANT
SOLUTION IRRIG SURGIPHOR (IV SOLUTION) ×2 IMPLANT
SPONGE DRAIN TRACH 4X4 STRL 2S (GAUZE/BANDAGES/DRESSINGS) ×2 IMPLANT
STAPLER SKIN PROX 35W (STAPLE) ×2 IMPLANT
STOCKINETTE IMPERV 14X48 (MISCELLANEOUS) ×2 IMPLANT
STOCKINETTE STRL BIAS CUT 8X4 (MISCELLANEOUS) ×2 IMPLANT
STRAP TIBIA SHORT (MISCELLANEOUS) ×2 IMPLANT
SUCTION TUBE FRAZIER 10FR DISP (SUCTIONS) ×2 IMPLANT
SUT VIC AB 0 CT1 36 (SUTURE) ×2 IMPLANT
SUT VIC AB 1 CT1 36 (SUTURE) ×4 IMPLANT
SUT VIC AB 2-0 CT2 27 (SUTURE) ×2 IMPLANT
SYR 30ML LL (SYRINGE) ×4 IMPLANT
TIP FAN IRRIG PULSAVAC PLUS (DISPOSABLE) ×2 IMPLANT
TOWEL OR 17X26 4PK STRL BLUE (TOWEL DISPOSABLE) IMPLANT
TOWER CARTRIDGE SMART MIX (DISPOSABLE) ×2 IMPLANT
TRAP FLUID SMOKE EVACUATOR (MISCELLANEOUS) ×2 IMPLANT
TRAY FOLEY MTR SLVR 16FR STAT (SET/KITS/TRAYS/PACK) ×2 IMPLANT
WATER STERILE IRR 1000ML POUR (IV SOLUTION) ×2 IMPLANT

## 2024-01-09 NOTE — Transfer of Care (Signed)
 Immediate Anesthesia Transfer of Care Note  Patient: Amy Herman  Procedure(s) Performed: ARTHROPLASTY, KNEE, TOTAL, USING IMAGELESS COMPUTER-ASSISTED NAVIGATION (Left: Knee)  Patient Location: PACU  Anesthesia Type:Spinal  Level of Consciousness: awake, alert , and oriented  Airway & Oxygen Therapy: Patient Spontanous Breathing  Post-op Assessment: Report given to RN and Post -op Vital signs reviewed and stable  Post vital signs: Reviewed and stable  Last Vitals:  Vitals Value Taken Time  BP 111/61 01/09/24 11:30  Temp    Pulse 86 01/09/24 11:31  Resp 21 01/09/24 11:31  SpO2 95 % 01/09/24 11:31  Vitals shown include unfiled device data.  Last Pain:  Vitals:   01/09/24 0642  TempSrc: Temporal  PainSc: 0-No pain         Complications: No notable events documented.

## 2024-01-09 NOTE — Progress Notes (Signed)
 Patient is not able to walk the distance required to go the bathroom, or he/she is unable to safely negotiate stairs required to access the bathroom.  A 3in1 BSC will alleviate this problem   Amenda Duclos P. Angie Fava M.D.

## 2024-01-09 NOTE — Anesthesia Procedure Notes (Signed)
 Date/Time: 01/09/2024 7:59 AM  Performed by: Lacretia Camelia NOVAK, CRNAPre-anesthesia Checklist: Patient identified, Emergency Drugs available, Suction available and Patient being monitored Patient Re-evaluated:Patient Re-evaluated prior to induction Oxygen Delivery Method: Simple face mask

## 2024-01-09 NOTE — Anesthesia Preprocedure Evaluation (Signed)
 Anesthesia Evaluation  Patient identified by MRN, date of birth, ID band Patient awake    Reviewed: Allergy & Precautions, NPO status , Patient's Chart, lab work & pertinent test results  History of Anesthesia Complications (+) PONV and history of anesthetic complications  Airway Mallampati: II  TM Distance: >3 FB Neck ROM: Full    Dental  (+) Partial Upper, Dental Advidsory Given   Pulmonary neg pulmonary ROS, neg sleep apnea, neg COPD, Patient abstained from smoking.Not current smoker   Pulmonary exam normal breath sounds clear to auscultation       Cardiovascular Exercise Tolerance: Good METShypertension, Pt. on medications (-) angina (-) CAD and (-) Past MI (-) dysrhythmias + Valvular Problems/Murmurs  Rhythm:Regular Rate:Normal - Systolic murmurs    Neuro/Psych negative neurological ROS  negative psych ROS   GI/Hepatic ,GERD  Medicated and Controlled,,(+)     (-) substance abuse    Endo/Other  neg diabetes    Renal/GU negative Renal ROS     Musculoskeletal  (+) Arthritis ,    Abdominal   Peds  Hematology   Anesthesia Other Findings Past Medical History: No date: Allergic rhinitis No date: Allergy No date: Bronchitis     Comment:  more frequent in the past No date: Cataract     Comment:  suspect per pt No date: Colon polyps     Comment:  history of No date: GERD (gastroesophageal reflux disease) No date: Glaucoma     Comment:  suspected No date: Heart murmur No date: History of kidney stones     Comment:  suspects that she had one and past it No date: Hyperlipidemia No date: Hypertension No date: Osteoarthritis of knee No date: Osteopenia No date: PONV (postoperative nausea and vomiting)     Comment:  with tubal ligation years ago  Reproductive/Obstetrics                              Anesthesia Physical Anesthesia Plan  ASA: 2  Anesthesia Plan: Spinal    Post-op Pain Management: Ofirmev  IV (intra-op)*, Gabapentin  PO (pre-op)* and Celebrex  PO (pre-op)*   Induction: Intravenous  PONV Risk Score and Plan: 3 and Ondansetron , Dexamethasone , Propofol  infusion, TIVA and Treatment may vary due to age or medical condition  Airway Management Planned: Natural Airway and Simple Face Mask  Additional Equipment: None  Intra-op Plan:   Post-operative Plan:   Informed Consent: I have reviewed the patients History and Physical, chart, labs and discussed the procedure including the risks, benefits and alternatives for the proposed anesthesia with the patient or authorized representative who has indicated his/her understanding and acceptance.       Plan Discussed with: CRNA and Surgeon  Anesthesia Plan Comments: (Discussed R/B/A of neuraxial anesthesia technique with patient: - rare risks of spinal/epidural hematoma, nerve damage, infection - Risk of PDPH - Risk of nausea and vomiting - Risk of conversion to general anesthesia and its associated risks, including sore throat, damage to lips/eyes/teeth/oropharynx, and rare risks such as cardiac and respiratory events. - Risk of allergic reactions  Discussed the role of CRNA in patient's perioperative care.  Patient voiced understanding.)         Anesthesia Quick Evaluation

## 2024-01-09 NOTE — Progress Notes (Signed)
 Subjective: 1 Day Post-Op Procedure(s) (LRB): ARTHROPLASTY, KNEE, TOTAL, USING IMAGELESS COMPUTER-ASSISTED NAVIGATION (Left) Patient reports pain as mild.   Patient seen in rounds with Dr. Mardee. Patient is well, and has had no acute complaints or problems Denies any CP, SOB, N/V, fevers or chills We will start therapy today.  Plan is to go Home after hospital stay.  Objective: Vital signs in last 24 hours: Temp:  [97.6 F (36.4 C)-98.7 F (37.1 C)] 97.6 F (36.4 C) (07/10 0441) Pulse Rate:  [54-91] (P) 54 (07/10 0747) Resp:  [14-20] (P) 16 (07/10 0747) BP: (97-126)/(51-68) (P) 105/64 (07/10 0747) SpO2:  [94 %-100 %] (P) 100 % (07/10 0747)  Intake/Output from previous day:  Intake/Output Summary (Last 24 hours) at 01/10/2024 0828 Last data filed at 01/10/2024 0304 Gross per 24 hour  Intake 1519.11 ml  Output 1676 ml  Net -156.89 ml    Intake/Output this shift: No intake/output data recorded.  Labs: No results for input(s): HGB in the last 72 hours. No results for input(s): WBC, RBC, HCT, PLT in the last 72 hours. No results for input(s): NA, K, CL, CO2, BUN, CREATININE, GLUCOSE, CALCIUM  in the last 72 hours. No results for input(s): LABPT, INR in the last 72 hours.  EXAM General - Patient is Alert, Appropriate, and Oriented Extremity - Neurologically intact Neurovascular intact Sensation intact distally Intact pulses distally Dorsiflexion/Plantar flexion intact No cellulitis present Compartment soft Dressing - dressing C/D/I and no drainage Motor Function - intact, moving foot and toes well on exam. JP Drain pulled without difficulty. Intact  Past Medical History:  Diagnosis Date   Adrenal mass, left (HCC)    1.6 cm   Allergic rhinitis    Allergy    Benign essential hypertension    Bronchitis    Cataract    Colon polyps    Degenerative arthritis of knee, bilateral    GERD (gastroesophageal reflux disease)    Glaucoma     Heart murmur    History of kidney stones    Hyperlipidemia    Hypokalemia    Lumbar disc disease    Nose colonized with MRSA 01/02/2024   a.) presurgical PCR (+) 01/02/2024 prior to LEFT TKA   Orthodontics (Invisalign)    Osteoarthritis of knee    Osteopenia    Osteoporosis    PONV (postoperative nausea and vomiting)    with tubal ligation years ago   Varicose veins of bilateral lower extremities with other complications 2014    Assessment/Plan: 1 Day Post-Op Procedure(s) (LRB): ARTHROPLASTY, KNEE, TOTAL, USING IMAGELESS COMPUTER-ASSISTED NAVIGATION (Left) Principal Problem:   History of total knee arthroplasty, left  Estimated body mass index is 28.15 kg/m as calculated from the following:   Height as of this encounter: 4' 9 (1.448 m).   Weight as of this encounter: 59 kg. Advance diet Up with therapy  Patient will continue to work with physical therapy to pass postoperative PT protocols, ROM and strengthening  Discussed with the patient continuing to utilize Polar Care  Patient will use bone foam in 20-30 minute intervals  Patient will wear TED hose bilaterally to help prevent DVT and clot formation  Discussed the Aquacel bandage.  This bandage will stay in place 7 days postoperatively.  Can be replaced with honeycomb bandages that will be sent home with the patient  Discussed sending the patient home with tramadol  and oxycodone  for as needed pain management.  Patient will also be sent home with Celebrex  to help with swelling and inflammation.  Patient will take an 81 mg aspirin  twice daily for DVT prophylaxis  JP drain removed without difficulty, intact  Weight-Bearing as tolerated to left leg  Patient will follow-up with [facility] orthopedics in 2 weeks for staple removal and reevaluation  Fonda Koyanagi, PA-C [facility] 01/10/2024, 8:28 AM

## 2024-01-09 NOTE — Plan of Care (Signed)
   Problem: Activity: Goal: Ability to avoid complications of mobility impairment will improve Outcome: Progressing   Problem: Pain Management: Goal: Pain level will decrease with appropriate interventions Outcome: Progressing

## 2024-01-09 NOTE — Discharge Summary (Signed)
 Physician Discharge Summary  Subjective: 1 Day Post-Op Procedure(s) (LRB): ARTHROPLASTY, KNEE, TOTAL, USING IMAGELESS COMPUTER-ASSISTED NAVIGATION (Left) Patient reports pain as mild.   Patient seen in rounds with Dr. Mardee. Patient is well, and has had no acute complaints or problems Denies any CP, SOB, N/V, fevers or chills We will start therapy today.  Patient is ready to go home  Physician Discharge Summary  Patient ID: Amy Herman MRN: [id number] DOB/AGE: Jan 01, 1946 78 y.o.  Admit date: 01/09/2024 Discharge date: 01/10/2024  Admission Diagnoses:  Discharge Diagnoses:  Principal Problem:   History of total knee arthroplasty, left   Discharged Condition: good  Hospital Course: Patient presented to the hospital on 01/09/2024 for an elective left total knee arthroplasty performed by Dr. Mardee. Patient was given 1g of TXA and 2g of Ancef  prior to the procedure. she tolerated the procedure well without any complications. See procedural note below for details. Postoperatively, the patient did very well. she was able to pass PT protocols on post-op day one without any issues. JP drain was removed without any difficulty and was intact. she was able to void her bladder without any difficulty. Physical exam was unremarkable. she denies any SOB, CP, N/V, fevers or chills. Vital signs are stable. Patient is stable to discharge home.   PROCEDURE:  Left total knee arthroplasty using computer-assisted navigation   SURGEON:  Lynwood SHAUNNA Mardee Mickey. M.D.   ASSISTANT:  Sidra Koyanagi, PA-C (present and scrubbed throughout the case, critical for assistance with exposure, retraction, instrumentation, and closure)   ANESTHESIA: spinal   ESTIMATED BLOOD LOSS: 50 mL   FLUIDS REPLACED: 1000 mL of crystalloid   TOURNIQUET TIME: 91 minutes   DRAINS: 2 medium Hemovac drains   SOFT TISSUE RELEASES: Anterior cruciate ligament, posterior cruciate ligament, deep medial collateral ligament,  patellofemoral ligament   IMPLANTS UTILIZED: DePuy Attune size 5N posterior stabilized femoral component (cemented), size 4 rotating platform tibial component (cemented), 35 mm medialized dome patella (cemented), and a 10 mm stabilized rotating platform polyethylene insert.  Treatments: none  Discharge Exam: Blood pressure (P) 105/64, pulse (!) (P) 54, temperature 97.6 F (36.4 C), temperature source Temporal, resp. rate (P) 16, height 4' 9 (1.448 m), weight 59 kg, SpO2 (P) 100%.   Disposition: home   Allergies as of 01/10/2024       Reactions   Timoptic [timolol] Shortness Of Breath   Mild   Alphagan [brimonidine] Swelling   Iritis   Cefuroxime Axetil    REACTION: bumps on tounge   Cefuroxime Axetil Other (See Comments)   TOLERATED CEFAZOLIN  Other reaction(s): RASH REACTION: bumps on tounge   Clarithromycin Other (See Comments)   Other reaction(s): OTHER REACTION: GI Biaxin   Levofloxacin Other (See Comments)   Joint pains   Lipitor [atorvastatin ] Other (See Comments)   Muscle pain   Losartan  Other (See Comments)   cough   Other    Other reaction(s): Unknown   Rosuvastatin  Other (See Comments)   REACTION: severe muscle pain   Valsartan     Taken off from MD d/t a recall   Zetia  [ezetimibe ] Diarrhea, Other (See Comments)   Stomach cramping        Medication List     TAKE these medications    acetaminophen  500 MG tablet Commonly known as: TYLENOL  Take 1,000 mg by mouth every 8 (eight) hours as needed for moderate pain (pain score 4-6).   albuterol  108 (90 Base) MCG/ACT inhaler Commonly known as: VENTOLIN  HFA Inhale 1 puff into the  lungs every 6 (six) hours as needed for wheezing or shortness of breath.   ascorbic acid 500 MG tablet Commonly known as: VITAMIN C Take 500 mg by mouth daily.   aspirin  81 MG chewable tablet Chew 1 tablet (81 mg total) by mouth 2 (two) times daily. What changed:  medication strength how much to take when to take this    Azelaic Acid  15 % gel Apply 1 Application topically 2 (two) times daily. Azelaic acid  gel every day to dark areas of skin prn   bimatoprost 0.01 % Soln Commonly known as: LUMIGAN Place 1 drop into both eyes at bedtime.   calcium  citrate-vitamin D  315-200 MG-UNIT tablet Commonly known as: CITRACAL+D Take 1 tablet by mouth daily.   celecoxib  200 MG capsule Commonly known as: CELEBREX  Take 1 capsule (200 mg total) by mouth 2 (two) times daily.   cetirizine 10 MG tablet Commonly known as: ZYRTEC Take 10 mg by mouth daily as needed.   chlorhexidine  4 % external liquid Commonly known as: HIBICLENS  Apply 15 mLs (1 Application total) topically as directed for 30 doses. Use as directed daily for 5 days every other week for 6 weeks.   EPINEPHrine  0.3 mg/0.3 mL Soaj injection Commonly known as: EPI-PEN Inject 0.3 mg into the muscle as needed for anaphylaxis.   fluticasone  50 MCG/ACT nasal spray Commonly known as: FLONASE  Place 2 sprays into both nostrils as needed.   Garlic 1000 MG Caps Take 1,000 mg by mouth daily.   montelukast  10 MG tablet Commonly known as: SINGULAIR  Take 10 mg by mouth at bedtime as needed (allergies).   mupirocin  ointment 2 % Commonly known as: BACTROBAN  Place 1 Application into the nose 2 (two) times daily for 60 doses. Use as directed 2 times daily for 5 days every other week for 6 weeks.   Omega 3 1000 MG Caps Take 1,000 mg by mouth daily.   omeprazole 20 MG capsule Commonly known as: PRILOSEC Take 20 mg by mouth daily as needed (acid reflux).   oxyCODONE  5 MG immediate release tablet Commonly known as: Oxy IR/ROXICODONE  Take 1 tablet (5 mg total) by mouth every 4 (four) hours as needed for moderate pain (pain score 4-6) (pain score 4-6).   potassium chloride  10 MEQ tablet Commonly known as: KLOR-CON  TAKE 3 TABLETS BY MOUTH DAILY What changed:  how much to take when to take this   traMADol  50 MG tablet Commonly known as: ULTRAM  Take 1-2  tablets (50-100 mg total) by mouth every 4 (four) hours as needed for moderate pain (pain score 4-6).   triamterene -hydrochlorothiazide  37.5-25 MG tablet Commonly known as: MAXZIDE -25 Take 1 tablet by mouth daily.   Vitamin D3 125 MCG (5000 UT) Caps Take 5,000 Units by mouth daily.               Durable Medical Equipment  (From admission, onward)           Start     Ordered   01/09/24 1229  DME Walker rolling  Once       Question:  Patient needs a walker to treat with the following condition  Answer:  Total knee replacement status   01/09/24 1228   01/09/24 1229  DME Bedside commode  Once       Comments: Patient is not able to walk the distance required to go the bathroom, or he/she is unable to safely negotiate stairs required to access the bathroom.  A 3in1 BSC will alleviate this problem  Question:  Patient needs a bedside commode to treat with the following condition  Answer:  Total knee replacement status   01/09/24 1228            Follow-up Information     Drake Chew, PA-C Follow up on 01/24/2024.   Specialty: Orthopedic Surgery Why: at 9:15am Contact information: [address] [place] [address] [phone]         Mardee Lynwood SQUIBB, MD Follow up on 02/21/2024.   Specialty: Orthopedic Surgery Why: at 1:45pm Contact information: [address] [facility] [place] [address] [phone]                 Signed: Sidra Drake 01/10/2024, 8:29 AM   Objective: Vital signs in last 24 hours: Temp:  [97.6 F (36.4 C)-98.7 F (37.1 C)] 97.6 F (36.4 C) (07/10 0441) Pulse Rate:  [54-91] (P) 54 (07/10 0747) Resp:  [14-20] (P) 16 (07/10 0747) BP: (97-126)/(51-68) (P) 105/64 (07/10 0747) SpO2:  [94 %-100 %] (P) 100 % (07/10 0747)  Intake/Output from previous day:  Intake/Output Summary (Last 24 hours) at 01/10/2024 0829 Last data filed at 01/10/2024 0304 Gross per 24 hour  Intake 1519.11 ml  Output 1676  ml  Net -156.89 ml    Intake/Output this shift: No intake/output data recorded.  Labs: No results for input(s): HGB in the last 72 hours. No results for input(s): WBC, RBC, HCT, PLT in the last 72 hours. No results for input(s): NA, K, CL, CO2, BUN, CREATININE, GLUCOSE, CALCIUM  in the last 72 hours. No results for input(s): LABPT, INR in the last 72 hours.  EXAM: General - Patient is Alert, Appropriate, and Oriented Extremity - Neurologically intact Neurovascular intact Sensation intact distally Intact pulses distally Dorsiflexion/Plantar flexion intact No cellulitis present Compartment soft Dressing - dressing C/D/I and no drainage Motor Function - intact, moving foot and toes well on exam. JP Drain pulled without difficulty. Intact  Assessment/Plan: 1 Day Post-Op Procedure(s) (LRB): ARTHROPLASTY, KNEE, TOTAL, USING IMAGELESS COMPUTER-ASSISTED NAVIGATION (Left) Procedure(s) (LRB): ARTHROPLASTY, KNEE, TOTAL, USING IMAGELESS COMPUTER-ASSISTED NAVIGATION (Left) Past Medical History:  Diagnosis Date   Adrenal mass, left (HCC)    1.6 cm   Allergic rhinitis    Allergy    Benign essential hypertension    Bronchitis    Cataract    Colon polyps    Degenerative arthritis of knee, bilateral    GERD (gastroesophageal reflux disease)    Glaucoma    Heart murmur    History of kidney stones    Hyperlipidemia    Hypokalemia    Lumbar disc disease    Nose colonized with MRSA 01/02/2024   a.) presurgical PCR (+) 01/02/2024 prior to LEFT TKA   Orthodontics (Invisalign)    Osteoarthritis of knee    Osteopenia    Osteoporosis    PONV (postoperative nausea and vomiting)    with tubal ligation years ago   Varicose veins of bilateral lower extremities with other complications 2014   Principal Problem:   History of total knee arthroplasty, left  Estimated body mass index is 28.15 kg/m as calculated from the following:   Height as of this encounter:  4' 9 (1.448 m).   Weight as of this encounter: 59 kg.  Patient will continue to work with physical therapy  Discussed with the patient continuing to utilize [employer]   Patient will use bone foam in 20-30 minute intervals   Patient will wear TED hose bilaterally to help prevent  DVT and clot formation   Discussed the Aquacel bandage.  This bandage will stay in place 7 days postoperatively.  Can be replaced with honeycomb bandages that will be sent home with the patient   Discussed sending the patient home with tramadol  and oxycodone  for as needed pain management.  Patient will also be sent home with Celebrex  to help with swelling and inflammation.  Patient will take an 81 mg aspirin  twice daily for DVT prophylaxis   JP drain removed without difficulty, intact   Weight-Bearing as tolerated to left leg   Patient will follow-up with [facility] orthopedics in 2 weeks for staple removal and reevaluation  Diet - Regular diet Follow up - in 2 weeks Activity - WBAT Disposition - Home Condition Upon Discharge - Good DVT Prophylaxis - Aspirin  and TED hose  Fonda CHARLENA Koyanagi, PA-C Orthopaedic Surgery 01/10/2024, 8:29 AM

## 2024-01-09 NOTE — Evaluation (Signed)
 Physical Therapy Evaluation Patient Details Name: Amy Herman MRN: [id number] DOB: [birth year] Today's Date: 01/09/2024  History of Present Illness  78 year old s/p left total knee arthroplasty, PMH of R TKA 12/12/21.  Clinical Impression  Patient received in bed, she is alert, talkative, ready to get up. Reporting no pain currently. Daughter and son in law in room on arrival. She is mod I with bed mobility, transfers with supervision and cues for hand placement. She ambulated 75 feet with RW and supervision. Cues for sequencing. She will continue to benefit from skilled PT to improve independence with mobility and increased strength and rom of L LE.          If plan is discharge home, recommend the following: A little help with walking and/or transfers;A little help with bathing/dressing/bathroom;Assist for transportation;Help with stairs or ramp for entrance   Can travel by private vehicle    yes    Equipment Recommendations None recommended by PT  Recommendations for Other Services       Functional Status Assessment Patient has had a recent decline in their functional status and demonstrates the ability to make significant improvements in function in a reasonable and predictable amount of time.     Precautions / Restrictions Precautions Precautions: Fall Recall of Precautions/Restrictions: Intact Restrictions Weight Bearing Restrictions Per Provider Order: Yes LLE Weight Bearing Per Provider Order: Weight bearing as tolerated      Mobility  Bed Mobility Overal bed mobility: Modified Independent                  Transfers Overall transfer level: Needs assistance Equipment used: Rolling walker (2 wheels) Transfers: Sit to/from Stand Sit to Stand: Contact guard assist           General transfer comment: cues for hand placement    Ambulation/Gait Ambulation/Gait assistance: Contact guard assist Gait Distance (Feet): 75 Feet Assistive device: Rolling  walker (2 wheels) Gait Pattern/deviations: Step-through pattern Gait velocity: decr     General Gait Details: cues for sequencing, WB status  Stairs            Wheelchair Mobility     Tilt Bed    Modified Rankin (Stroke Patients Only)       Balance Overall balance assessment: Modified Independent                                           Pertinent Vitals/Pain Pain Assessment Pain Assessment: No/denies pain    Home Living Family/patient expects to be discharged to:: Private residence Living Arrangements: Children Available Help at Discharge: Family;Available 24 hours/day Type of Home: Apartment Home Access: Level entry       Home Layout: One level Home Equipment: Agricultural consultant (2 wheels)      Prior Function Prior Level of Function : Independent/Modified Independent             Mobility Comments: patient very active at baseline, no AD, goes to gym everyday ADLs Comments: independent     Extremity/Trunk Assessment   Upper Extremity Assessment Upper Extremity Assessment: Overall WFL for tasks assessed    Lower Extremity Assessment Lower Extremity Assessment: Overall WFL for tasks assessed    Cervical / Trunk Assessment Cervical / Trunk Assessment: Normal  Communication   Communication Communication: No apparent difficulties    Cognition Arousal: Alert Behavior During Therapy: [facility] for tasks assessed/performed  PT - Cognitive impairments: No apparent impairments                         Following commands: Intact       Cueing Cueing Techniques: Verbal cues     General Comments      Exercises Total Joint Exercises Ankle Circles/Pumps: AROM, Both, 10 reps Quad Sets: AROM, Left, 5 reps Hip ABduction/ADduction: AROM, Left, 5 reps Goniometric ROM: 0-80   Assessment/Plan    PT Assessment Patient needs continued PT services  PT Problem List Decreased strength;Decreased activity tolerance;Decreased  mobility;Decreased range of motion;Decreased safety awareness;Decreased knowledge of use of DME;Decreased skin integrity       PT Treatment Interventions DME instruction;Gait training;Stair training;Functional mobility training;Therapeutic activities;Therapeutic exercise;Balance training;Patient/family education    PT Goals (Current goals can be found in the Care Plan section)  Acute Rehab PT Goals Patient Stated Goal: return home tomorrow PT Goal Formulation: With patient/family Time For Goal Achievement: 01/12/24 Potential to Achieve Goals: Good    Frequency BID     Co-evaluation               AM-PAC PT 6 Clicks Mobility  Outcome Measure Help needed turning from your back to your side while in a flat bed without using bedrails?: None Help needed moving from lying on your back to sitting on the side of a flat bed without using bedrails?: None Help needed moving to and from a bed to a chair (including a wheelchair)?: A Little Help needed standing up from a chair using your arms (e.g., wheelchair or bedside chair)?: A Little Help needed to walk in hospital room?: A Little Help needed climbing 3-5 steps with a railing? : A Little 6 Click Score: 20    End of Session   Activity Tolerance: Patient tolerated treatment well Patient left: in chair;with call bell/phone within reach;with family/visitor present Nurse Communication: Mobility status PT Visit Diagnosis: Other abnormalities of gait and mobility (R26.89);Muscle weakness (generalized) (M62.81);Difficulty in walking, not elsewhere classified (R26.2)    Time: 8554-8490 PT Time Calculation (min) (ACUTE ONLY): 24 min   Charges:   PT Evaluation $PT Eval Moderate Complexity: 1 Mod PT Treatments $Gait Training: 8-22 mins PT General Charges $$ ACUTE PT VISIT: 1 Visit         Jameel Quant, PT, GCS 01/09/24,3:25 PM

## 2024-01-09 NOTE — Interval H&P Note (Signed)
 History and Physical Interval Note:  01/09/2024 6:42 AM  Amy Herman  has presented today for surgery, with the diagnosis of Primary osteoarthritis of left knee.  The various methods of treatment have been discussed with the patient and family. After consideration of risks, benefits and other options for treatment, the patient has consented to  Procedure(s): ARTHROPLASTY, KNEE, TOTAL, USING IMAGELESS COMPUTER-ASSISTED NAVIGATION (Left) as a surgical intervention.  The patient's history has been reviewed, patient examined, no change in status, stable for surgery.  I have reviewed the patient's chart and labs.  Questions were answered to the patient's satisfaction.     Skanda Worlds P Seraphim Trow

## 2024-01-09 NOTE — Op Note (Signed)
 OPERATIVE NOTE  DATE OF SURGERY:  01/09/2024  PATIENT NAME:  Amy Herman   DOB: Jul 10, 1945  MRN: [id number]  PRE-OPERATIVE DIAGNOSIS: Degenerative arthrosis of the left knee, primary  POST-OPERATIVE DIAGNOSIS:  Same  PROCEDURE:  Left total knee arthroplasty using computer-assisted navigation  SURGEON:  Lynwood SHAUNNA Mardee Mickey. M.D.  ASSISTANT:  Sidra Koyanagi, PA-C (present and scrubbed throughout the case, critical for assistance with exposure, retraction, instrumentation, and closure)  ANESTHESIA: spinal  ESTIMATED BLOOD LOSS: 50 mL  FLUIDS REPLACED: 1000 mL of crystalloid  TOURNIQUET TIME: 91 minutes  DRAINS: 2 medium Hemovac drains  SOFT TISSUE RELEASES: Anterior cruciate ligament, posterior cruciate ligament, deep medial collateral ligament, patellofemoral ligament  IMPLANTS UTILIZED: DePuy Attune size 5N posterior stabilized femoral component (cemented), size 4 rotating platform tibial component (cemented), 35 mm medialized dome patella (cemented), and a 10 mm stabilized rotating platform polyethylene insert.  INDICATIONS FOR SURGERY: Amy Herman is a 78 y.o. year old female with a long history of progressive knee pain. X-rays demonstrated severe degenerative changes in tricompartmental fashion. The patient had not seen any significant improvement despite conservative nonsurgical intervention. After discussion of the risks and benefits of surgical intervention, the patient expressed understanding of the risks benefits and agree with plans for total knee arthroplasty.   The risks, benefits, and alternatives were discussed at length including but not limited to the risks of infection, bleeding, nerve injury, stiffness, blood clots, the need for revision surgery, cardiopulmonary complications, among others, and they were willing to proceed.  PROCEDURE IN DETAIL: The patient was brought into the operating room and, after adequate spinal anesthesia was achieved, a tourniquet was  placed on the patient's upper thigh. The patient's knee and leg were cleaned and prepped with alcohol and DuraPrep and draped in the usual sterile fashion. A timeout was performed as per usual protocol. The lower extremity was exsanguinated using an Esmarch, and the tourniquet was inflated to 300 mmHg. An anterior longitudinal incision was made followed by a standard mid vastus approach. The deep fibers of the medial collateral ligament were elevated in a subperiosteal fashion off of the medial flare of the tibia so as to maintain a continuous soft tissue sleeve. The patella was subluxed laterally and the patellofemoral ligament was incised. Inspection of the knee demonstrated severe degenerative changes with full-thickness loss of articular cartilage. Osteophytes were debrided using a rongeur. Anterior and posterior cruciate ligaments were excised. Two 4.0 mm Schanz pins were inserted in the femur and into the tibia for attachment of the array of trackers used for computer-assisted navigation. Hip center was identified using a circumduction technique. Distal landmarks were mapped using the computer. The distal femur and proximal tibia were mapped using the computer. The distal femoral cutting guide was positioned using computer-assisted navigation so as to achieve a 5 distal valgus cut. The femur was sized and it was felt that a size 5N femoral component was appropriate. A size 5 femoral cutting guide was positioned and the anterior cut was performed and verified using the computer. This was followed by completion of the posterior and chamfer cuts. Femoral cutting guide for the central box was then positioned in the center box cut was performed.  Attention was then directed to the proximal tibia. Medial and lateral menisci were excised. The extramedullary tibial cutting guide was positioned using computer-assisted navigation so as to achieve a 0 varus-valgus alignment and 3 posterior slope. The cut was  performed and verified using the computer. The proximal tibia  was sized and it was felt that a size 4 tibial tray was appropriate. Tibial and femoral trials were inserted followed by insertion of a 10 mm polyethylene insert. This allowed for excellent mediolateral soft tissue balancing both in flexion and in full extension. Finally, the patella was cut and prepared so as to accommodate a 35 mm medialized dome patella. A patella trial was placed and the knee was placed through a range of motion with excellent patellar tracking appreciated. The femoral trial was removed after debridement of posterior osteophytes. The central post-hole for the tibial component was reamed followed by insertion of a keel punch. Tibial trials were then removed. Cut surfaces of bone were irrigated with copious amounts of normal saline using pulsatile lavage and then suctioned dry. Polymethylmethacrylate cement with gentamicin was prepared in the usual fashion using a vacuum mixer. Cement was applied to the cut surface of the proximal tibia as well as along the undersurface of a size 4 rotating platform tibial component. Tibial component was positioned and impacted into place. Excess cement was removed using Personal assistant. Cement was then applied to the cut surfaces of the femur as well as along the posterior flanges of the size 5N femoral component. The femoral component was positioned and impacted into place. Excess cement was removed using Personal assistant. A 10 mm polyethylene trial was inserted and the knee was brought into full extension with steady axial compression applied. Finally, cement was applied to the backside of a 35 mm medialized dome patella and the patellar component was positioned and patellar clamp applied. Excess cement was removed using Personal assistant. After adequate curing of the cement, the tourniquet was deflated after a total tourniquet time of 91 minutes. Hemostasis was achieved using electrocautery. The knee  was irrigated with copious amounts of normal saline using pulsatile lavage followed by 450 ml of Surgiphor and then suctioned dry. 20 mL of 1.3% Exparel  and 60 mL of 0.25% Marcaine  in 40 mL of normal saline was injected along the posterior capsule, medial and lateral gutters, and along the arthrotomy site. A 10 mm stabilized rotating platform polyethylene insert was inserted and the knee was placed through a range of motion with excellent mediolateral soft tissue balancing appreciated and excellent patellar tracking noted. 2 medium drains were placed in the wound bed and brought out through separate stab incisions. The medial parapatellar portion of the incision was reapproximated using interrupted sutures of #1 Vicryl. Subcutaneous tissue was approximated in layers using first #0 Vicryl followed #2-0 Vicryl. The skin was approximated with skin staples. A sterile dressing was applied.  The patient tolerated the procedure well and was transported to the recovery room in stable condition.    Amy Herman, Jr., M.D.

## 2024-01-09 NOTE — Anesthesia Procedure Notes (Signed)
 Spinal  Patient location during procedure: OR Start time: 01/09/2024 7:55 AM End time: 01/09/2024 7:58 AM Reason for block: surgical anesthesia Staffing Performed: resident/CRNA  Anesthesiologist: Dario Barter, MD Resident/CRNA: Lacretia Camelia NOVAK, CRNA Performed by: Lacretia Camelia NOVAK, CRNA Authorized by: Dario Barter, MD   Preanesthetic Checklist Completed: patient identified, IV checked, site marked, risks and benefits discussed, surgical consent, monitors and equipment checked, pre-op evaluation and timeout performed Spinal Block Patient position: sitting Prep: ChloraPrep Patient monitoring: heart rate, continuous pulse ox, blood pressure and cardiac monitor Approach: midline Location: L3-4 Injection technique: single-shot Needle Needle type: Introducer and Pencan  Needle gauge: 24 G Needle length: 9 cm Assessment Sensory level: T10 Events: CSF return Additional Notes Sterile aseptic technique used throughout the procedure.  Negative paresthesia. Negative blood return. Positive free-flowing CSF. Expiration date of kit checked and confirmed. Patient tolerated procedure well, without complications.

## 2024-01-10 ENCOUNTER — Encounter: Payer: Self-pay | Admitting: Orthopedic Surgery

## 2024-01-10 DIAGNOSIS — M1712 Unilateral primary osteoarthritis, left knee: Secondary | ICD-10-CM | POA: Diagnosis not present

## 2024-01-10 DIAGNOSIS — Z79899 Other long term (current) drug therapy: Secondary | ICD-10-CM | POA: Diagnosis not present

## 2024-01-10 DIAGNOSIS — Z96651 Presence of right artificial knee joint: Secondary | ICD-10-CM | POA: Diagnosis not present

## 2024-01-10 DIAGNOSIS — Z7982 Long term (current) use of aspirin: Secondary | ICD-10-CM | POA: Diagnosis not present

## 2024-01-10 DIAGNOSIS — I10 Essential (primary) hypertension: Secondary | ICD-10-CM | POA: Diagnosis not present

## 2024-01-10 MED ORDER — TRAMADOL HCL 50 MG PO TABS: 50.0000 mg | ORAL_TABLET | ORAL | 0 refills | Status: AC | PRN

## 2024-01-10 MED ORDER — ASPIRIN 81 MG PO CHEW: 81.0000 mg | CHEWABLE_TABLET | Freq: Two times a day (BID) | ORAL | Status: AC

## 2024-01-10 MED ORDER — OXYCODONE HCL 5 MG PO TABS: 5.0000 mg | ORAL_TABLET | ORAL | 0 refills | Status: DC | PRN

## 2024-01-10 MED ORDER — CELECOXIB 200 MG PO CAPS: 200.0000 mg | ORAL_CAPSULE | Freq: Two times a day (BID) | ORAL | 1 refills | Status: AC

## 2024-01-10 MED ORDER — ACETAMINOPHEN 10 MG/ML IV SOLN
INTRAVENOUS | Status: AC
  Filled 2024-01-10: qty 100

## 2024-01-10 NOTE — Plan of Care (Signed)
 Progressing towards discharge

## 2024-01-10 NOTE — Progress Notes (Signed)
 Physical Therapy Treatment Patient Details Name: Amy Herman MRN: [id number] DOB: 05-Aug-1945 Today's Date: 01/10/2024   History of Present Illness 78 year old s/p left total knee arthroplasty, PMH of R TKA 12/12/21.    PT Comments  Patient up in recliner. Ready for PT. She reports no pain this morning. Patient requires verbal cues for hand placement during transfers. No physical assist needed. She ambulates with RW 200 feet with supervision and up/down steps with B rails first then again with min A, no rails. Patient instructed in HEP and handout provided. Patient demonstrated and verbalized understanding. She is ready to discharge home.        If plan is discharge home, recommend the following: Help with stairs or ramp for entrance   Can travel by private vehicle      yes  Equipment Recommendations  None recommended by PT    Recommendations for Other Services       Precautions / Restrictions Precautions Precautions: Fall Recall of Precautions/Restrictions: Intact Restrictions LLE Weight Bearing Per Provider Order: Weight bearing as tolerated     Mobility  Bed Mobility               General bed mobility comments: NT patient in recliner    Transfers Overall transfer level: Modified independent Equipment used: Rolling walker (2 wheels) Transfers: Sit to/from Stand Sit to Stand: Modified independent (Device/Increase time)           General transfer comment: cues for hand placement    Ambulation/Gait Ambulation/Gait assistance: Modified independent (Device/Increase time) Gait Distance (Feet): 200 Feet Assistive device: Rolling walker (2 wheels) Gait Pattern/deviations: Step-through pattern Gait velocity: decr     General Gait Details: no difficulties   Stairs Stairs: Yes Stairs assistance: Supervision Stair Management: Two rails, Alternating pattern Number of Stairs: 8 General stair comments: safe on steps   Wheelchair Mobility     Tilt  Bed    Modified Rankin (Stroke Patients Only)       Balance Overall balance assessment: Modified Independent                                          Communication Communication Communication: No apparent difficulties  Cognition Arousal: Alert Behavior During Therapy: WFL for tasks assessed/performed   PT - Cognitive impairments: No apparent impairments                         Following commands: Intact      Cueing Cueing Techniques: Verbal cues  Exercises Other Exercises Other Exercises: Reviewed HEP program with patient, she return demonstrates and verbalized understanding.    General Comments General comments (skin integrity, edema, etc.): Drainage noted on L knee gauze, LPN notified and in room to perform dressing change. Bilat TED hose donned      Pertinent Vitals/Pain Pain Assessment Pain Assessment: No/denies pain    Home Living Family/patient expects to be discharged to:: Private residence Living Arrangements: Children Available Help at Discharge: Family;Available 24 hours/day Type of Home: Apartment Home Access: Level entry       Home Layout: One level Home Equipment: Agricultural consultant (2 wheels)      Prior Function            PT Goals (current goals can now be found in the care plan section) Acute Rehab PT Goals Patient Stated Goal: return home  PT Goal Formulation: With patient Time For Goal Achievement: 01/12/24 Potential to Achieve Goals: Good Progress towards PT goals: Progressing toward goals    Frequency    BID      PT Plan      Co-evaluation              AM-PAC PT 6 Clicks Mobility   Outcome Measure  Help needed turning from your back to your side while in a flat bed without using bedrails?: None Help needed moving from lying on your back to sitting on the side of a flat bed without using bedrails?: None Help needed moving to and from a bed to a chair (including a wheelchair)?: None Help  needed standing up from a chair using your arms (e.g., wheelchair or bedside chair)?: None Help needed to walk in hospital room?: None Help needed climbing 3-5 steps with a railing? : A Little 6 Click Score: 23    End of Session   Activity Tolerance: Patient tolerated treatment well Patient left: in chair;with call bell/phone within reach Nurse Communication: Mobility status PT Visit Diagnosis: Muscle weakness (generalized) (M62.81)     Time: 9060-8997 PT Time Calculation (min) (ACUTE ONLY): 23 min  Charges:    $Gait Training: 8-22 mins $Therapeutic Exercise: 8-22 mins PT General Charges $$ ACUTE PT VISIT: 1 Visit                     Vasiliy Mccarry, PT, GCS 01/10/24,10:13 AM

## 2024-01-10 NOTE — Plan of Care (Signed)
   Problem: Pain Management: Goal: Pain level will decrease with appropriate interventions Outcome: Progressing   Problem: Skin Integrity: Goal: Will show signs of wound healing Outcome: Progressing

## 2024-01-10 NOTE — Anesthesia Postprocedure Evaluation (Signed)
 Anesthesia Post Note  Patient: Amy Herman  Procedure(s) Performed: ARTHROPLASTY, KNEE, TOTAL, USING IMAGELESS COMPUTER-ASSISTED NAVIGATION (Left: Knee)  Patient location during evaluation: Short Stay Anesthesia Type: Spinal Level of consciousness: awake and alert and oriented Pain management: satisfactory to patient Vital Signs Assessment: post-procedure vital signs reviewed and stable Respiratory status: spontaneous breathing Cardiovascular status: blood pressure returned to baseline Postop Assessment: patient able to bend at knees, no apparent nausea or vomiting, adequate PO intake, able to ambulate and spinal receding Anesthetic complications: no Comments: Has been able to void.   No notable events documented.   Last Vitals:  Vitals:   01/09/24 2339 01/10/24 0441  BP: (!) 111/58 (!) 126/51  Pulse: (!) 56 (!) 57  Resp: 16 14  Temp:  36.4 C  SpO2: 97% 96%    Last Pain:  Vitals:   01/10/24 0449  TempSrc:   PainSc: 0-No pain                 Vernona Peake Dyane

## 2024-01-10 NOTE — Evaluation (Signed)
 Occupational Therapy Evaluation Patient Details Name: ALONNIE BIEKER MRN: [id number] DOB: September 28, 1945 Today's Date: 01/10/2024   History of Present Illness   78 year old s/p left total knee arthroplasty, PMH of R TKA 12/12/21.     Clinical Impressions Pt seen for OT evaluation this date, POD#1 from above surgery. Pt was independent in all ADLs prior to surgery, active and goes to the gym 6 days a week.. Pt is eager to return to PLOF with less pain and improved safety and independence. Pt currently requires supervision with min vcs for safety/sequencing for LB dressing while in STS position. Pt completes UB dressing seated EOB, LB dressing from STS, and functional mobility with RW in hallway, supervision for 200 ft. Pt instructed in polar care mgt, falls prevention strategies, home/routines modifications, DME/AE for LB bathing and dressing tasks, and compression stocking mgt. Pt verbalizes understanding. Do not currently anticipate any OT needs following this hospitalization.        If plan is discharge home, recommend the following:   A little help with walking and/or transfers;A little help with bathing/dressing/bathroom;Assist for transportation;Help with stairs or ramp for entrance     Functional Status Assessment   Patient has had a recent decline in their functional status and demonstrates the ability to make significant improvements in function in a reasonable and predictable amount of time.     Equipment Recommendations   BSC/3in1      Precautions/Restrictions   Precautions Precautions: Fall Recall of Precautions/Restrictions: Intact Restrictions Weight Bearing Restrictions Per Provider Order: Yes LLE Weight Bearing Per Provider Order: Weight bearing as tolerated     Mobility Bed Mobility Overal bed mobility: Modified Independent                  Transfers Overall transfer level: Needs assistance Equipment used: Rolling walker (2 wheels) Transfers:  Sit to/from Stand Sit to Stand: Supervision                  Balance Overall balance assessment: Modified Independent                                         ADL either performed or assessed with clinical judgement   ADL Overall ADL's : Needs assistance/impaired                 Upper Body Dressing : Sitting;Supervision/safety Upper Body Dressing Details (indicate cue type and reason): doffs gown, dons bra and shirt/sweater. cues to perform seated vs standing for fall prevention Lower Body Dressing: Sit to/from stand;Cueing for sequencing;Cueing for safety;Supervision/safety Lower Body Dressing Details (indicate cue type and reason): dons TED bed level modA, doffs socks and dons new socks, underwear, pants and shoes with min vcs for sequencing for safety     Toileting- Clothing Manipulation and Hygiene: Supervision/safety;Sit to/from stand       Functional mobility during ADLs: Supervision/safety;Rolling walker (2 wheels) General ADL Comments: no physical assist, min vcs for falls prevention and sequencing for safety     Vision Baseline Vision/History: 1 Wears glasses Patient Visual Report: No change from baseline              Pertinent Vitals/Pain Pain Assessment Pain Assessment: No/denies pain     Extremity/Trunk Assessment Upper Extremity Assessment Upper Extremity Assessment: Overall WFL for tasks assessed   Lower Extremity Assessment Lower Extremity Assessment: Overall WFL for tasks assessed  Cervical / Trunk Assessment Cervical / Trunk Assessment: Normal   Communication Communication Communication: No apparent difficulties   Cognition Arousal: Alert Behavior During Therapy: WFL for tasks assessed/performed Cognition: No apparent impairments                               Following commands: Intact       Cueing  General Comments   Cueing Techniques: Verbal cues  Drainage noted on L knee gauze, LPN  notified and in room to perform dressing change. Bilat TED hose donned   Exercises Other Exercises Other Exercises: edu on role and purpose of OT, falls prevention and knee handout provided    Home Living Family/patient expects to be discharged to:: Private residence Living Arrangements: Children Available Help at Discharge: Family;Available 24 hours/day Type of Home: Apartment Home Access: Level entry     Home Layout: One level     Bathroom Shower/Tub: Chief Strategy Officer: Standard     Home Equipment: Agricultural consultant (2 wheels)          Prior Functioning/Environment Prior Level of Function : Independent/Modified Independent             Mobility Comments: patient very active at baseline, no AD, goes to gym everyday ADLs Comments: independent    OT Problem List: Decreased strength;Decreased range of motion;Impaired balance (sitting and/or standing);Decreased knowledge of use of DME or AE;Decreased knowledge of precautions        OT Goals(Current goals can be found in the care plan section)   Acute Rehab OT Goals OT Goal Formulation: All assessment and education complete, DC therapy Potential to Achieve Goals: Good   AM-PAC OT 6 Clicks Daily Activity     Outcome Measure Help from another person eating meals?: None Help from another person taking care of personal grooming?: None Help from another person toileting, which includes using toliet, bedpan, or urinal?: A Little Help from another person bathing (including washing, rinsing, drying)?: A Little Help from another person to put on and taking off regular upper body clothing?: None Help from another person to put on and taking off regular lower body clothing?: A Little 6 Click Score: 21   End of Session Equipment Utilized During Treatment: Rolling walker (2 wheels) Nurse Communication: Mobility status (gauze dressing)  Activity Tolerance: Patient tolerated treatment well Patient left: in  chair;with call bell/phone within reach  OT Visit Diagnosis: Other abnormalities of gait and mobility (R26.89)                Time: 9141-9063 OT Time Calculation (min): 38 min Charges:  OT General Charges $OT Visit: 1 Visit OT Evaluation $OT Eval Low Complexity: 1 Low OT Treatments $Self Care/Home Management : 23-37 mins Sierrah Luevano L. Garnette Greb, OTR/L  01/10/24, 11:02 AM

## 2024-01-10 NOTE — Care Management Obs Status (Signed)
 MEDICARE OBSERVATION STATUS NOTIFICATION   Patient Details  Name: Amy Herman MRN: [id number] Date of Birth: 1945/09/15   Medicare Observation Status Notification Given:  Yes    Rojelio [name] 01/10/2024, 11:58 AM

## 2024-01-10 NOTE — Progress Notes (Signed)
 DISCHARGE NOTE:  Pt given discharge instructions, scripts and 2 honeycomb dressings. TED hose on both legs. Pt wheeled to car by staff. Daughter providing transportation home.

## 2024-01-11 DIAGNOSIS — Z471 Aftercare following joint replacement surgery: Secondary | ICD-10-CM | POA: Diagnosis not present

## 2024-01-11 DIAGNOSIS — E785 Hyperlipidemia, unspecified: Secondary | ICD-10-CM | POA: Diagnosis not present

## 2024-01-11 DIAGNOSIS — Z87442 Personal history of urinary calculi: Secondary | ICD-10-CM | POA: Diagnosis not present

## 2024-01-11 DIAGNOSIS — E079 Disorder of thyroid, unspecified: Secondary | ICD-10-CM | POA: Diagnosis not present

## 2024-01-11 DIAGNOSIS — J309 Allergic rhinitis, unspecified: Secondary | ICD-10-CM | POA: Diagnosis not present

## 2024-01-11 DIAGNOSIS — Z791 Long term (current) use of non-steroidal anti-inflammatories (NSAID): Secondary | ICD-10-CM | POA: Diagnosis not present

## 2024-01-11 DIAGNOSIS — M810 Age-related osteoporosis without current pathological fracture: Secondary | ICD-10-CM | POA: Diagnosis not present

## 2024-01-11 DIAGNOSIS — H409 Unspecified glaucoma: Secondary | ICD-10-CM | POA: Diagnosis not present

## 2024-01-11 DIAGNOSIS — I10 Essential (primary) hypertension: Secondary | ICD-10-CM | POA: Diagnosis not present

## 2024-01-11 DIAGNOSIS — M51369 Other intervertebral disc degeneration, lumbar region without mention of lumbar back pain or lower extremity pain: Secondary | ICD-10-CM | POA: Diagnosis not present

## 2024-01-11 DIAGNOSIS — Z96652 Presence of left artificial knee joint: Secondary | ICD-10-CM | POA: Diagnosis not present

## 2024-01-11 DIAGNOSIS — E279 Disorder of adrenal gland, unspecified: Secondary | ICD-10-CM | POA: Diagnosis not present

## 2024-01-11 DIAGNOSIS — M170 Bilateral primary osteoarthritis of knee: Secondary | ICD-10-CM | POA: Diagnosis not present

## 2024-01-11 DIAGNOSIS — K219 Gastro-esophageal reflux disease without esophagitis: Secondary | ICD-10-CM | POA: Diagnosis not present

## 2024-01-11 DIAGNOSIS — Z860100 Personal history of colon polyps, unspecified: Secondary | ICD-10-CM | POA: Diagnosis not present

## 2024-01-11 DIAGNOSIS — Z7982 Long term (current) use of aspirin: Secondary | ICD-10-CM | POA: Diagnosis not present

## 2024-01-11 DIAGNOSIS — H269 Unspecified cataract: Secondary | ICD-10-CM | POA: Diagnosis not present

## 2024-01-13 DIAGNOSIS — M810 Age-related osteoporosis without current pathological fracture: Secondary | ICD-10-CM | POA: Diagnosis not present

## 2024-01-13 DIAGNOSIS — M170 Bilateral primary osteoarthritis of knee: Secondary | ICD-10-CM | POA: Diagnosis not present

## 2024-01-13 DIAGNOSIS — I10 Essential (primary) hypertension: Secondary | ICD-10-CM | POA: Diagnosis not present

## 2024-01-13 DIAGNOSIS — Z96652 Presence of left artificial knee joint: Secondary | ICD-10-CM | POA: Diagnosis not present

## 2024-01-13 DIAGNOSIS — E785 Hyperlipidemia, unspecified: Secondary | ICD-10-CM | POA: Diagnosis not present

## 2024-01-13 DIAGNOSIS — E079 Disorder of thyroid, unspecified: Secondary | ICD-10-CM | POA: Diagnosis not present

## 2024-01-13 DIAGNOSIS — Z471 Aftercare following joint replacement surgery: Secondary | ICD-10-CM | POA: Diagnosis not present

## 2024-01-13 DIAGNOSIS — Z7982 Long term (current) use of aspirin: Secondary | ICD-10-CM | POA: Diagnosis not present

## 2024-01-13 DIAGNOSIS — M51369 Other intervertebral disc degeneration, lumbar region without mention of lumbar back pain or lower extremity pain: Secondary | ICD-10-CM | POA: Diagnosis not present

## 2024-01-13 DIAGNOSIS — E279 Disorder of adrenal gland, unspecified: Secondary | ICD-10-CM | POA: Diagnosis not present

## 2024-01-13 DIAGNOSIS — H269 Unspecified cataract: Secondary | ICD-10-CM | POA: Diagnosis not present

## 2024-01-13 DIAGNOSIS — H409 Unspecified glaucoma: Secondary | ICD-10-CM | POA: Diagnosis not present

## 2024-01-13 DIAGNOSIS — Z860100 Personal history of colon polyps, unspecified: Secondary | ICD-10-CM | POA: Diagnosis not present

## 2024-01-13 DIAGNOSIS — Z87442 Personal history of urinary calculi: Secondary | ICD-10-CM | POA: Diagnosis not present

## 2024-01-13 DIAGNOSIS — K219 Gastro-esophageal reflux disease without esophagitis: Secondary | ICD-10-CM | POA: Diagnosis not present

## 2024-01-13 DIAGNOSIS — Z791 Long term (current) use of non-steroidal anti-inflammatories (NSAID): Secondary | ICD-10-CM | POA: Diagnosis not present

## 2024-01-13 DIAGNOSIS — J309 Allergic rhinitis, unspecified: Secondary | ICD-10-CM | POA: Diagnosis not present

## 2024-01-15 DIAGNOSIS — H409 Unspecified glaucoma: Secondary | ICD-10-CM | POA: Diagnosis not present

## 2024-01-15 DIAGNOSIS — I10 Essential (primary) hypertension: Secondary | ICD-10-CM | POA: Diagnosis not present

## 2024-01-15 DIAGNOSIS — M810 Age-related osteoporosis without current pathological fracture: Secondary | ICD-10-CM | POA: Diagnosis not present

## 2024-01-15 DIAGNOSIS — E279 Disorder of adrenal gland, unspecified: Secondary | ICD-10-CM | POA: Diagnosis not present

## 2024-01-15 DIAGNOSIS — Z471 Aftercare following joint replacement surgery: Secondary | ICD-10-CM | POA: Diagnosis not present

## 2024-01-15 DIAGNOSIS — Z860100 Personal history of colon polyps, unspecified: Secondary | ICD-10-CM | POA: Diagnosis not present

## 2024-01-15 DIAGNOSIS — E079 Disorder of thyroid, unspecified: Secondary | ICD-10-CM | POA: Diagnosis not present

## 2024-01-15 DIAGNOSIS — Z87442 Personal history of urinary calculi: Secondary | ICD-10-CM | POA: Diagnosis not present

## 2024-01-15 DIAGNOSIS — Z791 Long term (current) use of non-steroidal anti-inflammatories (NSAID): Secondary | ICD-10-CM | POA: Diagnosis not present

## 2024-01-15 DIAGNOSIS — M51369 Other intervertebral disc degeneration, lumbar region without mention of lumbar back pain or lower extremity pain: Secondary | ICD-10-CM | POA: Diagnosis not present

## 2024-01-15 DIAGNOSIS — J309 Allergic rhinitis, unspecified: Secondary | ICD-10-CM | POA: Diagnosis not present

## 2024-01-15 DIAGNOSIS — Z96652 Presence of left artificial knee joint: Secondary | ICD-10-CM | POA: Diagnosis not present

## 2024-01-15 DIAGNOSIS — K219 Gastro-esophageal reflux disease without esophagitis: Secondary | ICD-10-CM | POA: Diagnosis not present

## 2024-01-15 DIAGNOSIS — H269 Unspecified cataract: Secondary | ICD-10-CM | POA: Diagnosis not present

## 2024-01-15 DIAGNOSIS — Z7982 Long term (current) use of aspirin: Secondary | ICD-10-CM | POA: Diagnosis not present

## 2024-01-15 DIAGNOSIS — M170 Bilateral primary osteoarthritis of knee: Secondary | ICD-10-CM | POA: Diagnosis not present

## 2024-01-15 DIAGNOSIS — E785 Hyperlipidemia, unspecified: Secondary | ICD-10-CM | POA: Diagnosis not present

## 2024-01-17 DIAGNOSIS — Z471 Aftercare following joint replacement surgery: Secondary | ICD-10-CM | POA: Diagnosis not present

## 2024-01-17 DIAGNOSIS — M810 Age-related osteoporosis without current pathological fracture: Secondary | ICD-10-CM | POA: Diagnosis not present

## 2024-01-17 DIAGNOSIS — Z96652 Presence of left artificial knee joint: Secondary | ICD-10-CM | POA: Diagnosis not present

## 2024-01-17 DIAGNOSIS — Z7982 Long term (current) use of aspirin: Secondary | ICD-10-CM | POA: Diagnosis not present

## 2024-01-17 DIAGNOSIS — H409 Unspecified glaucoma: Secondary | ICD-10-CM | POA: Diagnosis not present

## 2024-01-17 DIAGNOSIS — K219 Gastro-esophageal reflux disease without esophagitis: Secondary | ICD-10-CM | POA: Diagnosis not present

## 2024-01-17 DIAGNOSIS — E785 Hyperlipidemia, unspecified: Secondary | ICD-10-CM | POA: Diagnosis not present

## 2024-01-17 DIAGNOSIS — J309 Allergic rhinitis, unspecified: Secondary | ICD-10-CM | POA: Diagnosis not present

## 2024-01-17 DIAGNOSIS — H269 Unspecified cataract: Secondary | ICD-10-CM | POA: Diagnosis not present

## 2024-01-17 DIAGNOSIS — E279 Disorder of adrenal gland, unspecified: Secondary | ICD-10-CM | POA: Diagnosis not present

## 2024-01-17 DIAGNOSIS — Z87442 Personal history of urinary calculi: Secondary | ICD-10-CM | POA: Diagnosis not present

## 2024-01-17 DIAGNOSIS — M51369 Other intervertebral disc degeneration, lumbar region without mention of lumbar back pain or lower extremity pain: Secondary | ICD-10-CM | POA: Diagnosis not present

## 2024-01-17 DIAGNOSIS — Z791 Long term (current) use of non-steroidal anti-inflammatories (NSAID): Secondary | ICD-10-CM | POA: Diagnosis not present

## 2024-01-17 DIAGNOSIS — Z860100 Personal history of colon polyps, unspecified: Secondary | ICD-10-CM | POA: Diagnosis not present

## 2024-01-17 DIAGNOSIS — I10 Essential (primary) hypertension: Secondary | ICD-10-CM | POA: Diagnosis not present

## 2024-01-17 DIAGNOSIS — E079 Disorder of thyroid, unspecified: Secondary | ICD-10-CM | POA: Diagnosis not present

## 2024-01-17 DIAGNOSIS — M170 Bilateral primary osteoarthritis of knee: Secondary | ICD-10-CM | POA: Diagnosis not present

## 2024-01-18 DIAGNOSIS — Z471 Aftercare following joint replacement surgery: Secondary | ICD-10-CM | POA: Diagnosis not present

## 2024-01-21 DIAGNOSIS — Z96652 Presence of left artificial knee joint: Secondary | ICD-10-CM | POA: Diagnosis not present

## 2024-01-21 DIAGNOSIS — E785 Hyperlipidemia, unspecified: Secondary | ICD-10-CM | POA: Diagnosis not present

## 2024-01-21 DIAGNOSIS — E079 Disorder of thyroid, unspecified: Secondary | ICD-10-CM | POA: Diagnosis not present

## 2024-01-21 DIAGNOSIS — J309 Allergic rhinitis, unspecified: Secondary | ICD-10-CM | POA: Diagnosis not present

## 2024-01-21 DIAGNOSIS — M810 Age-related osteoporosis without current pathological fracture: Secondary | ICD-10-CM | POA: Diagnosis not present

## 2024-01-21 DIAGNOSIS — H269 Unspecified cataract: Secondary | ICD-10-CM | POA: Diagnosis not present

## 2024-01-21 DIAGNOSIS — E279 Disorder of adrenal gland, unspecified: Secondary | ICD-10-CM | POA: Diagnosis not present

## 2024-01-21 DIAGNOSIS — K219 Gastro-esophageal reflux disease without esophagitis: Secondary | ICD-10-CM | POA: Diagnosis not present

## 2024-01-21 DIAGNOSIS — Z87442 Personal history of urinary calculi: Secondary | ICD-10-CM | POA: Diagnosis not present

## 2024-01-21 DIAGNOSIS — Z860100 Personal history of colon polyps, unspecified: Secondary | ICD-10-CM | POA: Diagnosis not present

## 2024-01-21 DIAGNOSIS — H409 Unspecified glaucoma: Secondary | ICD-10-CM | POA: Diagnosis not present

## 2024-01-21 DIAGNOSIS — M170 Bilateral primary osteoarthritis of knee: Secondary | ICD-10-CM | POA: Diagnosis not present

## 2024-01-21 DIAGNOSIS — Z471 Aftercare following joint replacement surgery: Secondary | ICD-10-CM | POA: Diagnosis not present

## 2024-01-21 DIAGNOSIS — Z791 Long term (current) use of non-steroidal anti-inflammatories (NSAID): Secondary | ICD-10-CM | POA: Diagnosis not present

## 2024-01-21 DIAGNOSIS — Z7982 Long term (current) use of aspirin: Secondary | ICD-10-CM | POA: Diagnosis not present

## 2024-01-21 DIAGNOSIS — I10 Essential (primary) hypertension: Secondary | ICD-10-CM | POA: Diagnosis not present

## 2024-01-21 DIAGNOSIS — M51369 Other intervertebral disc degeneration, lumbar region without mention of lumbar back pain or lower extremity pain: Secondary | ICD-10-CM | POA: Diagnosis not present

## 2024-01-23 DIAGNOSIS — E279 Disorder of adrenal gland, unspecified: Secondary | ICD-10-CM | POA: Diagnosis not present

## 2024-01-23 DIAGNOSIS — J309 Allergic rhinitis, unspecified: Secondary | ICD-10-CM | POA: Diagnosis not present

## 2024-01-23 DIAGNOSIS — I10 Essential (primary) hypertension: Secondary | ICD-10-CM | POA: Diagnosis not present

## 2024-01-23 DIAGNOSIS — Z791 Long term (current) use of non-steroidal anti-inflammatories (NSAID): Secondary | ICD-10-CM | POA: Diagnosis not present

## 2024-01-23 DIAGNOSIS — Z87442 Personal history of urinary calculi: Secondary | ICD-10-CM | POA: Diagnosis not present

## 2024-01-23 DIAGNOSIS — H409 Unspecified glaucoma: Secondary | ICD-10-CM | POA: Diagnosis not present

## 2024-01-23 DIAGNOSIS — E785 Hyperlipidemia, unspecified: Secondary | ICD-10-CM | POA: Diagnosis not present

## 2024-01-23 DIAGNOSIS — E079 Disorder of thyroid, unspecified: Secondary | ICD-10-CM | POA: Diagnosis not present

## 2024-01-23 DIAGNOSIS — M170 Bilateral primary osteoarthritis of knee: Secondary | ICD-10-CM | POA: Diagnosis not present

## 2024-01-23 DIAGNOSIS — M810 Age-related osteoporosis without current pathological fracture: Secondary | ICD-10-CM | POA: Diagnosis not present

## 2024-01-23 DIAGNOSIS — Z96652 Presence of left artificial knee joint: Secondary | ICD-10-CM | POA: Diagnosis not present

## 2024-01-23 DIAGNOSIS — K219 Gastro-esophageal reflux disease without esophagitis: Secondary | ICD-10-CM | POA: Diagnosis not present

## 2024-01-23 DIAGNOSIS — Z860100 Personal history of colon polyps, unspecified: Secondary | ICD-10-CM | POA: Diagnosis not present

## 2024-01-23 DIAGNOSIS — H269 Unspecified cataract: Secondary | ICD-10-CM | POA: Diagnosis not present

## 2024-01-23 DIAGNOSIS — M51369 Other intervertebral disc degeneration, lumbar region without mention of lumbar back pain or lower extremity pain: Secondary | ICD-10-CM | POA: Diagnosis not present

## 2024-01-23 DIAGNOSIS — Z471 Aftercare following joint replacement surgery: Secondary | ICD-10-CM | POA: Diagnosis not present

## 2024-01-23 DIAGNOSIS — Z7982 Long term (current) use of aspirin: Secondary | ICD-10-CM | POA: Diagnosis not present

## 2024-01-24 ENCOUNTER — Ambulatory Visit

## 2024-01-24 DIAGNOSIS — M25462 Effusion, left knee: Secondary | ICD-10-CM | POA: Diagnosis not present

## 2024-01-24 DIAGNOSIS — M25562 Pain in left knee: Secondary | ICD-10-CM | POA: Diagnosis not present

## 2024-01-25 DIAGNOSIS — J3089 Other allergic rhinitis: Secondary | ICD-10-CM | POA: Diagnosis not present

## 2024-01-30 DIAGNOSIS — M25562 Pain in left knee: Secondary | ICD-10-CM | POA: Diagnosis not present

## 2024-01-30 DIAGNOSIS — M25462 Effusion, left knee: Secondary | ICD-10-CM | POA: Diagnosis not present

## 2024-01-31 DIAGNOSIS — J301 Allergic rhinitis due to pollen: Secondary | ICD-10-CM | POA: Diagnosis not present

## 2024-01-31 DIAGNOSIS — J3081 Allergic rhinitis due to animal (cat) (dog) hair and dander: Secondary | ICD-10-CM | POA: Diagnosis not present

## 2024-01-31 DIAGNOSIS — J3089 Other allergic rhinitis: Secondary | ICD-10-CM | POA: Diagnosis not present

## 2024-02-01 DIAGNOSIS — M25562 Pain in left knee: Secondary | ICD-10-CM | POA: Diagnosis not present

## 2024-02-01 DIAGNOSIS — M25462 Effusion, left knee: Secondary | ICD-10-CM | POA: Diagnosis not present

## 2024-02-04 DIAGNOSIS — M25562 Pain in left knee: Secondary | ICD-10-CM | POA: Diagnosis not present

## 2024-02-04 DIAGNOSIS — M25462 Effusion, left knee: Secondary | ICD-10-CM | POA: Diagnosis not present

## 2024-02-05 ENCOUNTER — Telehealth: Payer: Self-pay | Admitting: *Deleted

## 2024-02-05 NOTE — Telephone Encounter (Signed)
 Copied from CRM #[id number]. Topic: General - Other >> Feb 05, 2024  2:28 PM Rosina BIRCH wrote: Reason for CRM: patient called stating she is feeling out a whole life insurance form and she is confused on some health questions being asked and would like a call back today Patient has to get the application mailed out by 8/15 CB [phone]

## 2024-02-06 DIAGNOSIS — M25562 Pain in left knee: Secondary | ICD-10-CM | POA: Diagnosis not present

## 2024-02-06 DIAGNOSIS — M25462 Effusion, left knee: Secondary | ICD-10-CM | POA: Diagnosis not present

## 2024-02-06 NOTE — Telephone Encounter (Signed)
 Pt had a question about wording of a question on form. Answered for pt. No other issues

## 2024-02-08 DIAGNOSIS — M25562 Pain in left knee: Secondary | ICD-10-CM | POA: Diagnosis not present

## 2024-02-08 DIAGNOSIS — J3089 Other allergic rhinitis: Secondary | ICD-10-CM | POA: Diagnosis not present

## 2024-02-08 DIAGNOSIS — J301 Allergic rhinitis due to pollen: Secondary | ICD-10-CM | POA: Diagnosis not present

## 2024-02-08 DIAGNOSIS — M25462 Effusion, left knee: Secondary | ICD-10-CM | POA: Diagnosis not present

## 2024-02-08 DIAGNOSIS — J3081 Allergic rhinitis due to animal (cat) (dog) hair and dander: Secondary | ICD-10-CM | POA: Diagnosis not present

## 2024-02-12 DIAGNOSIS — H401131 Primary open-angle glaucoma, bilateral, mild stage: Secondary | ICD-10-CM | POA: Diagnosis not present

## 2024-02-12 DIAGNOSIS — H2513 Age-related nuclear cataract, bilateral: Secondary | ICD-10-CM | POA: Diagnosis not present

## 2024-02-13 DIAGNOSIS — M25462 Effusion, left knee: Secondary | ICD-10-CM | POA: Diagnosis not present

## 2024-02-13 DIAGNOSIS — M25562 Pain in left knee: Secondary | ICD-10-CM | POA: Diagnosis not present

## 2024-02-15 DIAGNOSIS — J301 Allergic rhinitis due to pollen: Secondary | ICD-10-CM | POA: Diagnosis not present

## 2024-02-15 DIAGNOSIS — J3081 Allergic rhinitis due to animal (cat) (dog) hair and dander: Secondary | ICD-10-CM | POA: Diagnosis not present

## 2024-02-15 DIAGNOSIS — J3089 Other allergic rhinitis: Secondary | ICD-10-CM | POA: Diagnosis not present

## 2024-02-19 DIAGNOSIS — M25462 Effusion, left knee: Secondary | ICD-10-CM | POA: Diagnosis not present

## 2024-02-19 DIAGNOSIS — M25562 Pain in left knee: Secondary | ICD-10-CM | POA: Diagnosis not present

## 2024-02-21 DIAGNOSIS — J3089 Other allergic rhinitis: Secondary | ICD-10-CM | POA: Diagnosis not present

## 2024-02-21 DIAGNOSIS — Z96652 Presence of left artificial knee joint: Secondary | ICD-10-CM | POA: Diagnosis not present

## 2024-02-29 DIAGNOSIS — J3089 Other allergic rhinitis: Secondary | ICD-10-CM | POA: Diagnosis not present

## 2024-02-29 DIAGNOSIS — J301 Allergic rhinitis due to pollen: Secondary | ICD-10-CM | POA: Diagnosis not present

## 2024-02-29 DIAGNOSIS — J3081 Allergic rhinitis due to animal (cat) (dog) hair and dander: Secondary | ICD-10-CM | POA: Diagnosis not present

## 2024-03-06 DIAGNOSIS — J301 Allergic rhinitis due to pollen: Secondary | ICD-10-CM | POA: Diagnosis not present

## 2024-03-06 DIAGNOSIS — J3089 Other allergic rhinitis: Secondary | ICD-10-CM | POA: Diagnosis not present

## 2024-03-06 DIAGNOSIS — J3081 Allergic rhinitis due to animal (cat) (dog) hair and dander: Secondary | ICD-10-CM | POA: Diagnosis not present

## 2024-03-14 DIAGNOSIS — J301 Allergic rhinitis due to pollen: Secondary | ICD-10-CM | POA: Diagnosis not present

## 2024-03-14 DIAGNOSIS — J3089 Other allergic rhinitis: Secondary | ICD-10-CM | POA: Diagnosis not present

## 2024-03-14 DIAGNOSIS — J3081 Allergic rhinitis due to animal (cat) (dog) hair and dander: Secondary | ICD-10-CM | POA: Diagnosis not present

## 2024-03-21 DIAGNOSIS — J3089 Other allergic rhinitis: Secondary | ICD-10-CM | POA: Diagnosis not present

## 2024-03-21 DIAGNOSIS — J301 Allergic rhinitis due to pollen: Secondary | ICD-10-CM | POA: Diagnosis not present

## 2024-03-21 DIAGNOSIS — J3081 Allergic rhinitis due to animal (cat) (dog) hair and dander: Secondary | ICD-10-CM | POA: Diagnosis not present

## 2024-03-26 ENCOUNTER — Telehealth: Payer: Self-pay

## 2024-03-26 NOTE — Telephone Encounter (Signed)
 Called patient to discuss upcoming appointment 03/31/24.  Patient will not need prophylactic valtrex for the areas she is interested in treating./sh

## 2024-03-28 DIAGNOSIS — J3089 Other allergic rhinitis: Secondary | ICD-10-CM | POA: Diagnosis not present

## 2024-03-28 DIAGNOSIS — J3081 Allergic rhinitis due to animal (cat) (dog) hair and dander: Secondary | ICD-10-CM | POA: Diagnosis not present

## 2024-03-28 DIAGNOSIS — J301 Allergic rhinitis due to pollen: Secondary | ICD-10-CM | POA: Diagnosis not present

## 2024-03-31 ENCOUNTER — Encounter: Payer: Self-pay | Admitting: Dermatology

## 2024-03-31 ENCOUNTER — Ambulatory Visit: Payer: Self-pay | Admitting: Dermatology

## 2024-03-31 DIAGNOSIS — L988 Other specified disorders of the skin and subcutaneous tissue: Secondary | ICD-10-CM

## 2024-03-31 NOTE — Progress Notes (Signed)
   Follow-Up Visit   Subjective  Amy Herman is a 78 y.o. female who presents for the following: discuss filler for facial elastosis  The following portions of the chart were reviewed this encounter and updated as appropriate: medications, allergies, medical history  Review of Systems:  No other skin or systemic complaints except as noted in HPI or Assessment and Plan.  Objective  Well appearing patient in no apparent distress; mood and affect are within normal limits.  A focused examination was performed of the face. Relevant physical exam findings are noted in the Assessment and Plan or shown in photos.  Before photos               Assessment & Plan    FACIAL ELASTOSIS Exam: Rhytides and volume loss nasolabial folds  Treatment Plan: Cosmetic Consult- no charge Discussed Restylane Defyne, plan filler on f/u, $650 per syringe.  Will start with one syringe to NL folds  Recommend daily broad spectrum sunscreen SPF 30+ to sun-exposed areas, reapply every 2 hours as needed. Call for new or changing lesions.  Staying in the shade or wearing long sleeves, sun glasses (UVA+UVB protection) and wide brim hats (4-inch brim around the entire circumference of the hat) are also recommended for sun protection.     Return for filler (Restylane Defyne).  I, Grayce Saunas, RMA, am acting as scribe for Rexene Rattler, MD .   Documentation: I have reviewed the above documentation for accuracy and completeness, and I agree with the above.  Rexene Rattler, MD

## 2024-03-31 NOTE — Patient Instructions (Signed)

## 2024-04-04 DIAGNOSIS — J3089 Other allergic rhinitis: Secondary | ICD-10-CM | POA: Diagnosis not present

## 2024-04-04 DIAGNOSIS — J301 Allergic rhinitis due to pollen: Secondary | ICD-10-CM | POA: Diagnosis not present

## 2024-04-04 DIAGNOSIS — J3081 Allergic rhinitis due to animal (cat) (dog) hair and dander: Secondary | ICD-10-CM | POA: Diagnosis not present

## 2024-04-11 DIAGNOSIS — J301 Allergic rhinitis due to pollen: Secondary | ICD-10-CM | POA: Diagnosis not present

## 2024-04-11 DIAGNOSIS — J3081 Allergic rhinitis due to animal (cat) (dog) hair and dander: Secondary | ICD-10-CM | POA: Diagnosis not present

## 2024-04-11 DIAGNOSIS — J3089 Other allergic rhinitis: Secondary | ICD-10-CM | POA: Diagnosis not present

## 2024-04-15 ENCOUNTER — Ambulatory Visit: Admitting: Dermatology

## 2024-04-17 DIAGNOSIS — J3081 Allergic rhinitis due to animal (cat) (dog) hair and dander: Secondary | ICD-10-CM | POA: Diagnosis not present

## 2024-04-17 DIAGNOSIS — J3089 Other allergic rhinitis: Secondary | ICD-10-CM | POA: Diagnosis not present

## 2024-04-17 DIAGNOSIS — J301 Allergic rhinitis due to pollen: Secondary | ICD-10-CM | POA: Diagnosis not present

## 2024-04-25 DIAGNOSIS — J301 Allergic rhinitis due to pollen: Secondary | ICD-10-CM | POA: Diagnosis not present

## 2024-04-25 DIAGNOSIS — J3081 Allergic rhinitis due to animal (cat) (dog) hair and dander: Secondary | ICD-10-CM | POA: Diagnosis not present

## 2024-04-25 DIAGNOSIS — J3089 Other allergic rhinitis: Secondary | ICD-10-CM | POA: Diagnosis not present

## 2024-04-27 ENCOUNTER — Other Ambulatory Visit: Payer: Self-pay | Admitting: Family Medicine

## 2024-04-29 NOTE — Telephone Encounter (Signed)
 VM full, sent MyChart

## 2024-04-29 NOTE — Telephone Encounter (Signed)
 Pt is due for CPE (labs prior) on or after 05/25/24, please schedule  Med refilled once

## 2024-04-30 ENCOUNTER — Ambulatory Visit: Admitting: Dermatology

## 2024-05-06 ENCOUNTER — Ambulatory Visit (INDEPENDENT_AMBULATORY_CARE_PROVIDER_SITE_OTHER): Payer: Self-pay | Admitting: Dermatology

## 2024-05-06 ENCOUNTER — Encounter: Payer: Self-pay | Admitting: Dermatology

## 2024-05-06 DIAGNOSIS — L988 Other specified disorders of the skin and subcutaneous tissue: Secondary | ICD-10-CM

## 2024-05-06 NOTE — Progress Notes (Unsigned)
   Follow-Up Visit   Subjective  BLANKA ROCKHOLT is a 78 y.o. female who presents for the following: Botox for facial elastosis  The following portions of the chart were reviewed this encounter and updated as appropriate: medications, allergies, medical history  Review of Systems:  No other skin or systemic complaints except as noted in HPI or Assessment and Plan.  Objective  Well appearing patient in no apparent distress; mood and affect are within normal limits.  A focused examination was performed of the face.  Relevant physical exam findings are noted in the Assessment and Plan.  Injection map photo     Assessment & Plan    Facial Elastosis Botox 27.5 units injected today to: - Frown complex 27.5 units  Location: frown complex  Informed consent: Discussed risks (infection, pain, bleeding, bruising, swelling, allergic reaction, paralysis of nearby muscles, eyelid droop, double vision, neck weakness, difficulty breathing, headache, undesirable cosmetic result, and need for additional treatment) and benefits of the procedure, as well as the alternatives.  Informed consent was obtained.  Preparation: The area was cleansed with alcohol.  Procedure Details:  Botox was injected into the dermis with a 30-gauge needle. Pressure applied to any bleeding. Ice packs offered for swelling.  Lot Number:  [id number] Expiration:  10/27  Total Units Injected:  27.5  Plan: Tylenol  may be used for headache.  Allow 2 weeks before returning to clinic for additional dosing as needed. Patient will call for any problems.  Return for 3-4 wks Botox f/u.  I, Grayce Saunas, RMA, am acting as scribe for Alm Rhyme, MD .   Documentation: I have reviewed the above documentation for accuracy and completeness, and I agree with the above.  Alm Rhyme, MD

## 2024-05-06 NOTE — Patient Instructions (Signed)

## 2024-05-07 ENCOUNTER — Encounter: Payer: Self-pay | Admitting: Dermatology

## 2024-05-20 ENCOUNTER — Ambulatory Visit: Admitting: Family Medicine

## 2024-05-21 ENCOUNTER — Encounter: Payer: Self-pay | Admitting: Dermatology

## 2024-05-21 ENCOUNTER — Ambulatory Visit (INDEPENDENT_AMBULATORY_CARE_PROVIDER_SITE_OTHER): Payer: Self-pay | Admitting: Dermatology

## 2024-05-21 DIAGNOSIS — L988 Other specified disorders of the skin and subcutaneous tissue: Secondary | ICD-10-CM

## 2024-05-21 NOTE — Progress Notes (Signed)
   Follow-Up Visit   Subjective  Amy Herman is a 78 y.o. female who presents for the following: filler for facial elastosis  The following portions of the chart were reviewed this encounter and updated as appropriate: medications, allergies, medical history  Review of Systems:  No other skin or systemic complaints except as noted in HPI or Assessment and Plan.  Objective  Well appearing patient in no apparent distress; mood and affect are within normal limits.  A focused examination was performed of the face. Relevant physical exam findings are noted in the Assessment and Plan or shown in photos.  Before photos               After photos              Injection map photo    Assessment & Plan    Facial Elastosis  Prior to the procedure, the patient's past medical history, allergies and the rare but potential risks and complications were reviewed with the patient and a signed consent was obtained. Pre and post-treatment care was discussed and instructions provided.   Location: nasolabial folds  Filler Type: Restylane defyne  Procedure: The area was prepped thoroughly with Puracyn. After introducing the needle into the desired treatment area, the syringe plunger was drawn back to ensure there was no flash of blood prior to injecting the filler in order to minimize risk of intravascular injection and vascular occlusion. After injection of the filler, the treated areas were cleansed and iced to reduce swelling. Post-treatment instructions were reviewed with the patient.       Patient tolerated the procedure well. The patient will call with any problems, questions or concerns prior to their next appointment.   Return for as scheduled for Botox.  I, Grayce Saunas, RMA, am acting as scribe for Rexene Rattler, MD .   Documentation: I have reviewed the above documentation for accuracy and completeness, and I agree with the above.  Rexene Rattler,  MD

## 2024-05-21 NOTE — Patient Instructions (Signed)

## 2024-05-28 ENCOUNTER — Encounter: Payer: Self-pay | Admitting: Family Medicine

## 2024-05-28 ENCOUNTER — Ambulatory Visit: Admitting: Family Medicine

## 2024-05-28 ENCOUNTER — Ambulatory Visit (INDEPENDENT_AMBULATORY_CARE_PROVIDER_SITE_OTHER): Admitting: Family Medicine

## 2024-05-28 VITALS — BP 122/65 | HR 82 | Temp 98.6°F | Ht 60.0 in | Wt 136.0 lb

## 2024-05-28 DIAGNOSIS — Z23 Encounter for immunization: Secondary | ICD-10-CM | POA: Diagnosis not present

## 2024-05-28 DIAGNOSIS — I10 Essential (primary) hypertension: Secondary | ICD-10-CM | POA: Diagnosis not present

## 2024-05-28 DIAGNOSIS — M810 Age-related osteoporosis without current pathological fracture: Secondary | ICD-10-CM

## 2024-05-28 DIAGNOSIS — Z79899 Other long term (current) drug therapy: Secondary | ICD-10-CM | POA: Diagnosis not present

## 2024-05-28 DIAGNOSIS — K219 Gastro-esophageal reflux disease without esophagitis: Secondary | ICD-10-CM | POA: Diagnosis not present

## 2024-05-28 DIAGNOSIS — E782 Mixed hyperlipidemia: Secondary | ICD-10-CM

## 2024-05-28 DIAGNOSIS — E876 Hypokalemia: Secondary | ICD-10-CM

## 2024-05-28 DIAGNOSIS — E278 Other specified disorders of adrenal gland: Secondary | ICD-10-CM

## 2024-05-28 NOTE — Patient Instructions (Addendum)
 Schedule fasting labs when convenient   Take care of yourself  Keep up the great exercise  Also protein and fluid intake    No change in medicines    Flu shot today   If you are interested in the new shingles vaccine (Shingrix) - call your local pharmacy to check on coverage and availability

## 2024-05-28 NOTE — Progress Notes (Signed)
 Subjective:    Patient ID: Amy Herman, female    DOB: [birth year], 78 y.o.   MRN: [id number]  HPI  Wt Readings from Last 3 Encounters:  05/28/24 136 lb (61.7 kg)  01/09/24 130 lb 1.1 oz (59 kg)  01/02/24 130 lb (59 kg)   26.56 kg/m  Vitals:   05/28/24 1604 05/28/24 1626  BP: (!) 144/74 122/65  Pulse: 82   Temp: 98.6 F (37 C)   SpO2: 97%    Flu shot   Pt presents for follow up of chronic health problems  HTN Hypokalemia  Osteoporosis   HTN bp is stable today  No cp or palpitations or headaches or edema  No side effects to medicines  BP Readings from Last 3 Encounters:  05/28/24 122/65  01/10/24 108/62  01/02/24 120/62    Triamterene -hct 37.5-25 mg daily No problems with it   Working out 6 d per week  Some cycling    Lab Results  Component Value Date   NA 140 01/02/2024   K 3.8 01/02/2024   CO2 27 01/02/2024   GLUCOSE 101 (H) 01/02/2024   BUN 21 01/02/2024   CREATININE 0.80 01/02/2024   CALCIUM  9.5 01/02/2024   GFR 57.49 (L) 05/14/2023   GFRNONAA >60 01/02/2024   Hypokalemia Takes klor con 10 meq three times daily   GERD  Omeprazole 20 mg prn  Does not take often at all  Lab Results  Component Value Date   VITAMINB12 631 05/14/2023   Last vitamin D  Lab Results  Component Value Date   VD25OH 62.18 05/14/2023   Osteoporosis Dexa 12/2022 Falls none  Fractures -none  Supplements-vit D  Exercise - lots and strength training  Also pilates  Was given info on alendronate in past  Not interested yet- wants to see if strength training helps   Had knee replacement   Hyperlipidemia Lab Results  Component Value Date   CHOL 213 (H) 05/14/2023   HDL 68.20 05/14/2023   LDLCALC 133 (H) 05/14/2023   TRIG 62.0 05/14/2023   CHOLHDL 3 05/14/2023   Intolerant of statins and zetia  Eating well   The 10-year ASCVD risk score (Arnett DK, et al., 2019) is: 29.5%   Values used to calculate the score:     Age: 57 years     Clincally  relevant sex: Female     Is Non-Hispanic African American: No     Diabetic: No     Tobacco smoker: No     Systolic Blood Pressure: 122 mmHg     Is BP treated: Yes     HDL Cholesterol: 68.2 mg/dL     Total Cholesterol: 213 mg/dL Had cardiac ca score in 66%ile  Has seen cardiology and discussed pcyk9?    Patient Active Problem List   Diagnosis Date Noted   History of total knee arthroplasty, left 01/09/2024   Elevated coronary artery calcium  score 05/27/2023   Current use of proton pump inhibitor 05/10/2023   Routine general medical examination at a health care facility 04/11/2022   Colon cancer screening 04/11/2022   Adrenal mass 03/27/2022   Status post total right knee replacement 12/12/2021   Allergic rhinitis due to animal (cat) (dog) hair and dander 12/11/2021   Allergic rhinitis 09/16/2020   Allergic rhinitis due to pollen 08/15/2020   Statin myopathy 08/10/2020   Left knee pain 12/13/2017   Primary osteoarthritis of left knee 12/13/2017   History of UTI 11/11/2017   Encounter for screening mammogram for breast  cancer 05/08/2016   Estrogen deficiency 02/16/2016   GERD (gastroesophageal reflux disease) 06/18/2013   Varicose veins of bilateral lower extremities with other complications 07/23/2012   Lumbar disc disease 08/15/2011   Joint pain 08/15/2011   Osteoporosis 01/31/2011   Post-menopausal 01/31/2011   Hypokalemia 01/31/2011   Hyperlipidemia 03/24/2008   Essential hypertension, benign 03/24/2008   History of colonic polyps 03/24/2008   Past Medical History:  Diagnosis Date   Adrenal mass, left    1.6 cm   Allergic rhinitis    Allergy    Benign essential hypertension    Bronchitis    Cataract    Colon polyps    Degenerative arthritis of knee, bilateral    GERD (gastroesophageal reflux disease)    Glaucoma    Heart murmur    History of kidney stones    Hyperlipidemia    Hypokalemia    Lumbar disc disease    Nose colonized with MRSA 01/02/2024   a.)  presurgical PCR (+) 01/02/2024 prior to LEFT TKA   Orthodontics (Invisalign)    Osteoarthritis of knee    Osteopenia    Osteoporosis    PONV (postoperative nausea and vomiting)    with tubal ligation years ago   Varicose veins of bilateral lower extremities with other complications 2014   Past Surgical History:  Procedure Laterality Date   BROW LIFT Bilateral 02/16/2022   Procedure: BLEPHAROPLASTY UPPER EYELID; W/EXCESS SKIN BILATERAL BLEPHAROPTOSIS REPAIR; RESECT EX BILATERAL;  Surgeon: Ashley Greig HERO, MD;  Location: [facility];  Service: Ophthalmology;  Laterality: Bilateral;   BUNIONECTOMY Bilateral    PIN LEFT GREAT TOE   CARPAL TUNNEL RELEASE Bilateral    COLONOSCOPY     COLONOSCOPY W/ POLYPECTOMY     FUNCTIONAL ENDOSCOPIC SINUS SURGERY  05/04/2011   HYSTERECTOMY     only removed uterus   KNEE ARTHROPLASTY Right 12/12/2021   Procedure: COMPUTER ASSISTED TOTAL KNEE ARTHROPLASTY;  Surgeon: Mardee Lynwood SQUIBB, MD;  Location: [facility];  Service: Orthopedics;  Laterality: Right;   KNEE ARTHROPLASTY Left 01/09/2024   Procedure: ARTHROPLASTY, KNEE, TOTAL, USING IMAGELESS COMPUTER-ASSISTED NAVIGATION;  Surgeon: Mardee Lynwood SQUIBB, MD;  Location: [facility];  Service: Orthopedics;  Laterality: Left;   KNEE ARTHROSCOPY Right    TUBAL LIGATION     bilateral   VEIN SURGERY Bilateral 2006   legs   Social History   Tobacco Use   Smoking status: Never   Smokeless tobacco: Never   Tobacco comments:    used to have secondary smoke exposure from husband  Vaping Use   Vaping status: Never Used  Substance Use Topics   Alcohol use: No    Alcohol/week: 0.0 standard drinks of alcohol   Drug use: No   Family History  Problem Relation Age of Onset   Cancer Mother        colon, lungs, liver   Colon cancer Mother 83   Cancer Father        prostate   Heart disease Father        A-fib   Stroke Father        TIA's   Alcohol abuse Brother    Cancer Brother        pancreatic, smoker    Diabetes Brother    Diabetes Brother    Stomach cancer Neg Hx    Esophageal cancer Neg Hx    Rectal cancer Neg Hx    Breast cancer Neg Hx    Allergies  Allergen Reactions  Timoptic [Timolol] Shortness Of Breath    Mild   Alphagan [Brimonidine] Swelling    Iritis   Cefuroxime Axetil     REACTION: bumps on tounge   Cefuroxime Axetil Other (See Comments)    TOLERATED CEFAZOLIN  Other reaction(s): RASH REACTION: bumps on tounge   Clarithromycin Other (See Comments)    Other reaction(s): OTHER REACTION: GI Biaxin    Levofloxacin Other (See Comments)    Joint pains   Lipitor [Atorvastatin ] Other (See Comments)    Muscle pain   Losartan  Other (See Comments)    cough   Other     Other reaction(s): Unknown   Rosuvastatin  Other (See Comments)    REACTION: severe muscle pain    Valsartan      Taken off from MD d/t a recall   Zetia  [Ezetimibe ] Diarrhea and Other (See Comments)    Stomach cramping   Current Outpatient Medications on File Prior to Visit  Medication Sig Dispense Refill   acetaminophen  (TYLENOL ) 500 MG tablet Take 1,000 mg by mouth every 8 (eight) hours as needed for moderate pain (pain score 4-6).     albuterol  (VENTOLIN  HFA) 108 (90 Base) MCG/ACT inhaler Inhale 1 puff into the lungs every 6 (six) hours as needed for wheezing or shortness of breath.     ascorbic acid (VITAMIN C) 500 MG tablet Take 500 mg by mouth daily.     aspirin  81 MG chewable tablet Chew 1 tablet (81 mg total) by mouth 2 (two) times daily.     Azelaic Acid  15 % gel Apply 1 Application topically 2 (two) times daily. Azelaic acid  gel every day to dark areas of skin prn 50 g 2   bimatoprost (LUMIGAN) 0.01 % SOLN Place 1 drop into both eyes at bedtime.     calcium  citrate-vitamin D  (CITRACAL+D) 315-200 MG-UNIT per tablet Take 1 tablet by mouth daily.     celecoxib  (CELEBREX ) 200 MG capsule Take 1 capsule (200 mg total) by mouth 2 (two) times daily. 60 capsule 1   cetirizine (ZYRTEC) 10 MG tablet  Take 10 mg by mouth daily as needed.     chlorhexidine  (HIBICLENS ) 4 % external liquid Apply 15 mLs (1 Application total) topically as directed for 30 doses. Use as directed daily for 5 days every other week for 6 weeks. 946 mL 1   Cholecalciferol  (VITAMIN D3) 125 MCG (5000 UT) CAPS Take 5,000 Units by mouth daily.     EPINEPHrine  0.3 mg/0.3 mL IJ SOAJ injection Inject 0.3 mg into the muscle as needed for anaphylaxis.     fluticasone  (FLONASE ) 50 MCG/ACT nasal spray Place 2 sprays into both nostrils as needed.     Garlic 1000 MG CAPS Take 1,000 mg by mouth daily.     montelukast  (SINGULAIR ) 10 MG tablet Take 10 mg by mouth at bedtime as needed (allergies).     Omega 3 1000 MG CAPS Take 1,000 mg by mouth daily.     omeprazole (PRILOSEC) 20 MG capsule Take 20 mg by mouth daily as needed (acid reflux).     potassium chloride  (KLOR-CON ) 10 MEQ tablet TAKE 3 TABLETS BY MOUTH DAILY (Patient taking differently: 10 mEq 3 (three) times daily. TAKE 3 TABLETS BY MOUTH DAILY) 270 tablet 3   traMADol  (ULTRAM ) 50 MG tablet Take 1-2 tablets (50-100 mg total) by mouth every 4 (four) hours as needed for moderate pain (pain score 4-6). 30 tablet 0   triamterene -hydrochlorothiazide  (MAXZIDE -25) 37.5-25 MG tablet TAKE 1 TABLET BY MOUTH DAILY 90 tablet  0   No current facility-administered medications on file prior to visit.    Review of Systems  Constitutional:  Negative for activity change, appetite change, fatigue, fever and unexpected weight change.  HENT:  Negative for congestion, ear pain, rhinorrhea, sinus pressure and sore throat.   Eyes:  Negative for pain, redness and visual disturbance.  Respiratory:  Negative for cough, shortness of breath and wheezing.   Cardiovascular:  Negative for chest pain and palpitations.  Gastrointestinal:  Negative for abdominal pain, blood in stool, constipation and diarrhea.  Endocrine: Negative for polydipsia and polyuria.  Genitourinary:  Negative for dysuria, frequency  and urgency.  Musculoskeletal:  Negative for arthralgias, back pain and myalgias.  Skin:  Negative for pallor and rash.  Allergic/Immunologic: Negative for environmental allergies.  Neurological:  Negative for dizziness, syncope and headaches.  Hematological:  Negative for adenopathy. Does not bruise/bleed easily.  Psychiatric/Behavioral:  Negative for decreased concentration and dysphoric mood. The patient is not nervous/anxious.        Objective:   Physical Exam Constitutional:      General: She is not in acute distress.    Appearance: Normal appearance. She is well-developed and normal weight. She is not ill-appearing or diaphoretic.  HENT:     Head: Normocephalic and atraumatic.     Right Ear: External ear normal.     Left Ear: External ear normal.     Nose: Nose normal.     Mouth/Throat:     Mouth: Mucous membranes are moist.     Pharynx: Oropharynx is clear.  Eyes:     General: No scleral icterus.       Right eye: No discharge.        Left eye: No discharge.     Conjunctiva/sclera: Conjunctivae normal.     Pupils: Pupils are equal, round, and reactive to light.  Neck:     Thyroid : No thyromegaly.     Vascular: No carotid bruit or JVD.  Cardiovascular:     Rate and Rhythm: Normal rate and regular rhythm.     Heart sounds: Normal heart sounds.     No gallop.  Pulmonary:     Effort: Pulmonary effort is normal. No respiratory distress.     Breath sounds: Normal breath sounds. No wheezing or rales.  Abdominal:     General: Bowel sounds are normal. There is no distension.     Palpations: Abdomen is soft. There is no mass.     Tenderness: There is no abdominal tenderness.  Musculoskeletal:        General: No tenderness.     Cervical back: Normal range of motion and neck supple.     Comments: Muscular frame  Lymphadenopathy:     Cervical: No cervical adenopathy.  Skin:    General: Skin is warm and dry.     Coloration: Skin is not pale.     Findings: No erythema or  rash.  Neurological:     Mental Status: She is alert.     Cranial Nerves: No cranial nerve deficit.     Motor: No abnormal muscle tone.     Coordination: Coordination normal.     Deep Tendon Reflexes: Reflexes are normal and symmetric. Reflexes normal.  Psychiatric:        Mood and Affect: Mood normal.           Assessment & Plan:   Problem List Items Addressed This Visit       Cardiovascular and Mediastinum   Essential  hypertension, benign - Primary   bp in fair control at this time  BP Readings from Last 1 Encounters:  05/28/24 122/65   No changes needed Most recent labs reviewed  Disc lifstyle change with low sodium diet and exercise  triam hct 37.5-25 mg daily = will continue        Relevant Orders   TSH   Lipid panel   CBC with Differential/Platelet   Comprehensive metabolic panel with GFR     Digestive   GERD (gastroesophageal reflux disease)   Taking omeprazole 20 mg prn Good control/needing this less often Encouraged to avoid triggers         Musculoskeletal and Integument   Osteoporosis   Dexa 12/2022 No falls or fractures  Lots of strength building exercise  Not interested in alendronate yet  Would like to see next dexa result first   Discussed fall prevention, supplements and exercise for bone density        Relevant Orders   VITAMIN D  25 Hydroxy (Vit-D Deficiency, Fractures)     Other   Hypokalemia   Continues klor con 10 meq  Labs ordered       Relevant Orders   Comprehensive metabolic panel with GFR   Hyperlipidemia   Disc goals for lipids and reasons to control them Rev last labs with pt Rev low sat fat diet in detail Intolerant of statins and zetia  ASCVD risk 29.5% Cardiac ca score 66%ile  Did see cardiologist  ? If candidate for other medication / pcyk9 or other   Labs planned       Relevant Orders   Lipid panel   Comprehensive metabolic panel with GFR   Current use of proton pump inhibitor   Pt takes omeprazole  20 mg prn  Needing less often Added D and B12 to upcoming labs      Relevant Orders   Vitamin B12   Adrenal mass   Re check was reassuring No recommendation to continue imaging      Other Visit Diagnoses       Need for influenza vaccination       Relevant Orders   Flu vaccine HIGH DOSE PF(Fluzone Trivalent) (Completed)

## 2024-05-30 NOTE — Assessment & Plan Note (Signed)
 bp in fair control at this time  BP Readings from Last 1 Encounters:  05/28/24 122/65   No changes needed Most recent labs reviewed  Disc lifstyle change with low sodium diet and exercise  triam hct 37.5-25 mg daily = will continue

## 2024-05-30 NOTE — Assessment & Plan Note (Signed)
 Disc goals for lipids and reasons to control them Rev last labs with pt Rev low sat fat diet in detail Intolerant of statins and zetia  ASCVD risk 29.5% Cardiac ca score 66%ile  Did see cardiologist  ? If candidate for other medication / pcyk9 or other   Labs planned

## 2024-05-30 NOTE — Assessment & Plan Note (Signed)
 Re check was reassuring No recommendation to continue imaging

## 2024-05-30 NOTE — Assessment & Plan Note (Signed)
 Taking omeprazole 20 mg prn Good control/needing this less often Encouraged to avoid triggers

## 2024-05-30 NOTE — Assessment & Plan Note (Signed)
 Pt takes omeprazole 20 mg prn  Needing less often Added D and B12 to upcoming labs

## 2024-05-30 NOTE — Assessment & Plan Note (Signed)
 Dexa 12/2022 No falls or fractures  Lots of strength building exercise  Not interested in alendronate yet  Would like to see next dexa result first   Discussed fall prevention, supplements and exercise for bone density

## 2024-05-30 NOTE — Assessment & Plan Note (Signed)
 Continues klor con 10 meq  Labs ordered

## 2024-07-14 ENCOUNTER — Other Ambulatory Visit: Payer: Self-pay | Admitting: Family Medicine

## 2024-07-14 NOTE — Telephone Encounter (Unsigned)
 Copied from CRM #[id number]. Topic: Clinical - Medication Refill >> Jul 14, 2024  2:57 PM Delon T wrote: Medication: potassium chloride  (KLOR-CON ) 10 MEQ tablet- [facility] supply [facility] and then the 90 day to [facility]  Has the patient contacted their pharmacy? Yes (Agent: If no, request that the patient contact the pharmacy for the refill. If patient does not wish to contact the pharmacy document the reason why and proceed with request.) (Agent: If yes, when and what did the pharmacy advise?)  This is the patient's preferred pharmacy:  [id number] [address], [place] - [address] [place] [address] Phone: [phone] Fax: [phone]  [facility] - [place], [place] - [address] [facility] [address] [place] [address] [place] [address] Phone: [phone] Fax: [phone]    Is this the correct pharmacy for this prescription? Yes If no, delete pharmacy and type the correct one.   Has the prescription been filled recently? Yes  Is the patient out of the medication? Yes  Has the patient been seen for an appointment in the last year OR does the patient have an upcoming appointment? Yes  Can we respond through MyChart? Yes  Agent: Please be advised that Rx refills may take up to 3 business days. We ask that you follow-up with your pharmacy.

## 2024-07-15 MED ORDER — POTASSIUM CHLORIDE ER 10 MEQ PO TBCR: EXTENDED_RELEASE_TABLET | ORAL | 0 refills | Status: AC

## 2024-07-15 NOTE — Telephone Encounter (Signed)
 done

## 2024-07-21 ENCOUNTER — Other Ambulatory Visit: Payer: Self-pay | Admitting: Family Medicine

## 2024-09-11 ENCOUNTER — Ambulatory Visit: Admitting: Dermatology
